# Patient Record
Sex: Female | Born: 1980
Health system: Southern US, Community
[De-identification: ages and names within clinical notes are randomized; demographics above are authoritative.]

## PROBLEM LIST (undated history)

## (undated) ENCOUNTER — Inpatient Hospital Stay (HOSPITAL_COMMUNITY): Payer: Self-pay

## (undated) DIAGNOSIS — R000 Tachycardia, unspecified: Secondary | ICD-10-CM

## (undated) DIAGNOSIS — G43019 Migraine without aura, intractable, without status migrainosus: Secondary | ICD-10-CM

## (undated) DIAGNOSIS — Z9109 Other allergy status, other than to drugs and biological substances: Secondary | ICD-10-CM

## (undated) DIAGNOSIS — I471 Supraventricular tachycardia, unspecified: Secondary | ICD-10-CM

## (undated) DIAGNOSIS — G501 Atypical facial pain: Secondary | ICD-10-CM

## (undated) DIAGNOSIS — H53149 Visual discomfort, unspecified: Secondary | ICD-10-CM

## (undated) DIAGNOSIS — G500 Trigeminal neuralgia: Secondary | ICD-10-CM

## (undated) DIAGNOSIS — J309 Allergic rhinitis, unspecified: Secondary | ICD-10-CM

## (undated) DIAGNOSIS — K219 Gastro-esophageal reflux disease without esophagitis: Secondary | ICD-10-CM

## (undated) DIAGNOSIS — R2990 Unspecified symptoms and signs involving the nervous system: Secondary | ICD-10-CM

## (undated) DIAGNOSIS — N939 Abnormal uterine and vaginal bleeding, unspecified: Secondary | ICD-10-CM

## (undated) DIAGNOSIS — T7840XA Allergy, unspecified, initial encounter: Secondary | ICD-10-CM

## (undated) DIAGNOSIS — K5909 Other constipation: Secondary | ICD-10-CM

## (undated) DIAGNOSIS — G4700 Insomnia, unspecified: Secondary | ICD-10-CM

## (undated) DIAGNOSIS — I959 Hypotension, unspecified: Secondary | ICD-10-CM

## (undated) DIAGNOSIS — I498 Other specified cardiac arrhythmias: Secondary | ICD-10-CM

## (undated) DIAGNOSIS — F410 Panic disorder [episodic paroxysmal anxiety] without agoraphobia: Secondary | ICD-10-CM

## (undated) DIAGNOSIS — H8190 Unspecified disorder of vestibular function, unspecified ear: Secondary | ICD-10-CM

## (undated) DIAGNOSIS — Z8742 Personal history of other diseases of the female genital tract: Secondary | ICD-10-CM

## (undated) DIAGNOSIS — F411 Generalized anxiety disorder: Secondary | ICD-10-CM

## (undated) DIAGNOSIS — I951 Orthostatic hypotension: Secondary | ICD-10-CM

## (undated) DIAGNOSIS — J45909 Unspecified asthma, uncomplicated: Secondary | ICD-10-CM

## (undated) DIAGNOSIS — Z973 Presence of spectacles and contact lenses: Secondary | ICD-10-CM

## (undated) DIAGNOSIS — G43E19 Chronic migraine with aura, intractable, without status migrainosus: Secondary | ICD-10-CM

## (undated) DIAGNOSIS — G90A Postural orthostatic tachycardia syndrome (POTS): Secondary | ICD-10-CM

## (undated) DIAGNOSIS — R002 Palpitations: Secondary | ICD-10-CM

## (undated) HISTORY — DX: Panic disorder (episodic paroxysmal anxiety): F41.0

## (undated) HISTORY — PX: BREAST EXCISIONAL BIOPSY: SUR124

## (undated) HISTORY — DX: Tachycardia, unspecified: R00.0

## (undated) HISTORY — PX: OTHER SURGICAL HISTORY: SHX169

## (undated) HISTORY — DX: Supraventricular tachycardia, unspecified: I47.10

## (undated) HISTORY — PX: FOOT SURGERY: SHX648

## (undated) HISTORY — DX: Atypical facial pain: G50.1

## (undated) HISTORY — PX: APPENDECTOMY: SHX54

## (undated) HISTORY — DX: Allergy, unspecified, initial encounter: T78.40XA

## (undated) HISTORY — DX: Hypotension, unspecified: I95.9

## (undated) HISTORY — PX: BREAST SURGERY: SHX581

## (undated) HISTORY — PX: KNEE ARTHROSCOPY: SUR90

## (undated) HISTORY — DX: Migraine without aura, intractable, without status migrainosus: G43.019

## (undated) HISTORY — DX: Supraventricular tachycardia: I47.1

## (undated) HISTORY — PX: KNEE SURGERY: SHX244

---
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
## 2001-08-26 HISTORY — PX: LAPAROSCOPIC APPENDECTOMY: SUR753

## 2008-05-01 ENCOUNTER — Ambulatory Visit: Payer: Self-pay | Admitting: Diagnostic Radiology

## 2008-05-01 ENCOUNTER — Emergency Department (HOSPITAL_BASED_OUTPATIENT_CLINIC_OR_DEPARTMENT_OTHER): Admission: EM | Admit: 2008-05-01 | Discharge: 2008-05-01 | Payer: Self-pay | Admitting: Emergency Medicine

## 2008-07-02 ENCOUNTER — Emergency Department (HOSPITAL_BASED_OUTPATIENT_CLINIC_OR_DEPARTMENT_OTHER): Admission: EM | Admit: 2008-07-02 | Discharge: 2008-07-03 | Payer: Self-pay | Admitting: Emergency Medicine

## 2010-06-06 LAB — CBC
Hemoglobin: 14.4 g/dL (ref 12.0–15.0)
Platelets: 173 10*3/uL (ref 150–400)
RDW: 12.7 % (ref 11.5–15.5)

## 2010-06-06 LAB — COMPREHENSIVE METABOLIC PANEL
ALT: <6 U/L (ref 0–35)
Albumin: 4.8 g/dL (ref 3.5–5.2)
Alkaline Phosphatase: 60 U/L (ref 39–117)
Glucose, Bld: 86 mg/dL (ref 70–99)
Potassium: 3.6 mEq/L (ref 3.5–5.1)
Sodium: 143 mEq/L (ref 135–145)
Total Protein: 8.3 g/dL (ref 6.0–8.3)

## 2010-06-06 LAB — DIFFERENTIAL
Basophils Relative: 1 % (ref 0–1)
Eosinophils Absolute: 0.1 10*3/uL (ref 0.0–0.7)
Eosinophils Relative: 2 % (ref 0–5)
Monocytes Absolute: 0.4 10*3/uL (ref 0.1–1.0)
Monocytes Relative: 5 % (ref 3–12)

## 2010-06-06 LAB — URINALYSIS, ROUTINE W REFLEX MICROSCOPIC
Glucose, UA: NEGATIVE mg/dL
Hgb urine dipstick: NEGATIVE
Ketones, ur: NEGATIVE mg/dL
Protein, ur: NEGATIVE mg/dL

## 2010-06-06 LAB — POCT TOXICOLOGY PANEL
Benzodiazepines: POSITIVE
Tetrahydrocannabinol: POSITIVE

## 2010-06-08 LAB — CBC
MCV: 91.5 fL (ref 78.0–100.0)
Platelets: 201 10*3/uL (ref 150–400)
RDW: 12.4 % (ref 11.5–15.5)
WBC: 6.5 10*3/uL (ref 4.0–10.5)

## 2010-06-08 LAB — BASIC METABOLIC PANEL
BUN: 14 mg/dL (ref 6–23)
Chloride: 107 mEq/L (ref 96–112)
Creatinine, Ser: 0.8 mg/dL (ref 0.4–1.2)
Glucose, Bld: 92 mg/dL (ref 70–99)

## 2010-06-08 LAB — D-DIMER, QUANTITATIVE: D-Dimer, Quant: 0.31 ug/mL-FEU (ref 0.00–0.48)

## 2010-06-08 LAB — DIFFERENTIAL
Basophils Absolute: 0 10*3/uL (ref 0.0–0.1)
Basophils Relative: 1 % (ref 0–1)
Eosinophils Absolute: 0.1 10*3/uL (ref 0.0–0.7)
Lymphs Abs: 1.8 10*3/uL (ref 0.7–4.0)
Neutrophils Relative %: 63 % (ref 43–77)

## 2011-02-27 HISTORY — PX: FOOT SURGERY: SHX648

## 2011-11-28 ENCOUNTER — Emergency Department (INDEPENDENT_AMBULATORY_CARE_PROVIDER_SITE_OTHER): Payer: Self-pay

## 2011-11-28 ENCOUNTER — Encounter (HOSPITAL_COMMUNITY): Payer: Self-pay

## 2011-11-28 ENCOUNTER — Emergency Department (INDEPENDENT_AMBULATORY_CARE_PROVIDER_SITE_OTHER)
Admission: EM | Admit: 2011-11-28 | Discharge: 2011-11-28 | Disposition: A | Discharge status: Home / Self Care | Payer: Self-pay | Source: Home / Self Care

## 2011-11-28 DIAGNOSIS — J40 Bronchitis, not specified as acute or chronic: Secondary | ICD-10-CM

## 2011-11-28 DIAGNOSIS — J45909 Unspecified asthma, uncomplicated: Secondary | ICD-10-CM

## 2011-11-28 HISTORY — DX: Hypotension, unspecified: I95.9

## 2011-11-28 MED ORDER — ALBUTEROL SULFATE (5 MG/ML) 0.5% IN NEBU: 2.5000 mg | INHALATION_SOLUTION | RESPIRATORY_TRACT | Status: DC

## 2011-11-28 MED ORDER — ALBUTEROL SULFATE (5 MG/ML) 0.5% IN NEBU
INHALATION_SOLUTION | RESPIRATORY_TRACT | Status: AC
  Filled 2011-11-28: qty 0.5

## 2011-11-28 MED ORDER — PREDNISONE 10 MG PO TABS: ORAL_TABLET | ORAL | Status: DC

## 2011-11-28 NOTE — ED Notes (Signed)
Patient complains of cough, states she was seen at [facility] on Saturday, provider suspected Pneumonia but was unable to diagnose without a chest xray

## 2011-11-28 NOTE — ED Provider Notes (Signed)
Medical screening examination/treatment/procedure(s) were performed by resident physician or non-physician practitioner and as supervising physician I was immediately available for consultation/collaboration.   KINDL,JAMES DOUGLAS MD.    James D Kindl, MD 11/28/11 2041 

## 2011-11-28 NOTE — ED Provider Notes (Signed)
History     CSN: [id number]  Arrival date & time 11/28/11  1622   None     Chief Complaint  Patient presents with  . Cough    (Consider location/radiation/quality/duration/timing/severity/associated sxs/prior treatment) Patient is a 31 y.o. female presenting with cough. The history is provided by the patient. No language interpreter was used.  Cough This is a new problem. The current episode started more than 1 week ago. The problem occurs constantly. The problem has been gradually worsening. The cough is non-productive. There has been no fever. Associated symptoms include shortness of breath. She has tried nothing for the symptoms. The treatment provided no relief. She is not a smoker.  Pt complains of a cough and congestion.  Pt reports she has a history of exercise induced asthma   Past Medical History  Diagnosis Date  . Low blood pressure     Past Surgical History  Procedure Date  . Breast surgery   . Appendectomy   . Knee surgery 2001  2002  . Foot surgery 2011 2012    No family history on file.  History  Substance Use Topics  . Smoking status: Current Some Day Smoker  . Smokeless tobacco: Not on file  . Alcohol Use: Yes    OB History    Grav Para Term Preterm Abortions TAB SAB Ect Mult Living                  Review of Systems  Respiratory: Positive for cough and shortness of breath.   All other systems reviewed and are negative.    Allergies  Codeine  Home Medications   Current Outpatient Rx  Name Route Sig Dispense Refill  . ALBUTEROL SULFATE IN Inhalation Inhale into the lungs. Two puffs every 4 hours    . AMOXICILLIN-POT CLAVULANATE 875-125 MG PO TABS Oral Take 1 tablet by mouth 2 (two) times daily.      BP 123/84  Pulse 110  Temp 98.3 F (36.8 C) (Oral)  Resp 18  SpO2 93%  LMP 11/16/2011  Physical Exam  Nursing note and vitals reviewed. Constitutional: She appears well-developed and well-nourished.  HENT:  Head: Normocephalic and  atraumatic.  Right Ear: External ear normal.  Left Ear: External ear normal.  Nose: Nose normal.  Mouth/Throat: Oropharynx is clear and moist.  Eyes: Conjunctivae normal and EOM are normal. Pupils are equal, round, and reactive to light.  Neck: Normal range of motion. Neck supple.  Cardiovascular: Normal rate and normal heart sounds.   Pulmonary/Chest: Effort normal. She has wheezes.  Abdominal: Soft.  Musculoskeletal: Normal range of motion.  Neurological: She is alert.  Skin: Skin is warm.    ED Course  Procedures (including critical care time)  Labs Reviewed - No data to display Dg Chest 2 View  11/28/2011  *RADIOLOGY REPORT*  Clinical Data: Cough  CHEST - 2 VIEW  Comparison: May 01, 2008  Findings: Lungs clear.  Heart size and pulmonary vascularity are normal.  No adenopathy.  No bone lesions.  IMPRESSION: Lungs clear.   Original Report Authenticated By: Arvin Collard. WOODRUFF III, M.D.      No diagnosis found.    MDM  Pt given albuterol neb here.  Rx for prednisone  Followup with your physician for recheck in 2-3 days       Lonia Skinner [place], [place] 11/28/11 1850

## 2012-02-27 HISTORY — PX: LAPAROSCOPIC ABDOMINAL EXPLORATION: SHX6249

## 2012-03-18 ENCOUNTER — Ambulatory Visit: Payer: BC Managed Care – PPO

## 2012-03-18 ENCOUNTER — Ambulatory Visit (INDEPENDENT_AMBULATORY_CARE_PROVIDER_SITE_OTHER): Payer: BC Managed Care – PPO | Admitting: Family Medicine

## 2012-03-18 VITALS — BP 102/80 | HR 82 | Temp 97.6°F | Resp 16 | Ht 69.0 in | Wt 165.0 lb

## 2012-03-18 DIAGNOSIS — M79641 Pain in right hand: Secondary | ICD-10-CM

## 2012-03-18 DIAGNOSIS — M79609 Pain in unspecified limb: Secondary | ICD-10-CM

## 2012-03-18 DIAGNOSIS — T148XXA Other injury of unspecified body region, initial encounter: Secondary | ICD-10-CM

## 2012-03-18 DIAGNOSIS — G4700 Insomnia, unspecified: Secondary | ICD-10-CM

## 2012-03-18 MED ORDER — IBUPROFEN 800 MG PO TABS: 800.0000 mg | ORAL_TABLET | Freq: Three times a day (TID) | ORAL | Status: DC | PRN

## 2012-03-18 NOTE — Progress Notes (Signed)
[facility]:  Office Visit  Chief Complaint:  Chief Complaint  Patient presents with  . pinky pain    right hand fell last saturday    HPI: Mia Rubio is a 32 y.o. female who complains of  Right hand pain s/p fall 5 days ago. 4-5/10 Sharp pain. Took ibuprofen, placed herself in wrist  And finger splint, had some relief. Constant sharp pain. No prior injuries or surgeiers. Worse pain with ROM and typing.   Scientist, research (life sciences) , Print production planner. Has problems going to sleep due to the type of job she does. 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 Only use it as needed. For example if she uses it when she is at a commercial shoot. Tried melatonin without relief. Was on Ambien 10 mg. Denies any abuse/dependence issues.   Past Medical History  Diagnosis Date  . Low blood pressure   . Tachycardia   . Hypotension    Past Surgical History  Procedure Date  . Breast surgery   . Appendectomy   . Knee surgery 2001  2002  . Foot surgery 2011 2012   History   Social History  . Marital Status: Single    Spouse Name: N/A    Number of Children: N/A  . Years of Education: N/A   Social History Main Topics  . Smoking status: Current Some Day Smoker  . Smokeless tobacco: None     Comment: only when drinks  . Alcohol Use: Yes  . Drug Use: No  . Sexually Active: None   Other Topics Concern  . None   Social History Narrative  . None   Family History  Problem Relation Age of Onset  . Cancer Maternal Grandmother     leg  . Diabetes Maternal Grandmother    Allergies  Allergen Reactions  . Codeine    Prior to Admission medications   Medication Sig Start Date End Date Taking? Authorizing Provider  glucosamine-chondroitin 500-400 MG tablet Take 1 tablet by mouth 2 (two)  times daily with a meal.   Yes Historical Provider, MD  ibuprofen (ADVIL,MOTRIN) 800 MG tablet Take 800 mg by mouth every 8 (eight) hours as needed.   Yes Historical Provider, MD  ALBUTEROL SULFATE IN Inhale into the lungs. Two puffs every 4 hours    Historical Provider, MD  amoxicillin-clavulanate (AUGMENTIN) 875-125 MG per tablet Take 1 tablet by mouth 2 (two) times daily.    Historical Provider, MD  predniSONE (DELTASONE) 10 MG tablet 6,5,4,3,2,1 taper 11/28/11   Elson Areas, PA  zolpidem (AMBIEN) 10 MG tablet Take 10 mg by mouth at bedtime as needed.    Historical Provider, MD     ROS: The patient denies fevers, chills, night sweats, unintentional weight loss, chest pain, palpitations, wheezing, dyspnea on exertion, nausea, vomiting, abdominal pain, dysuria, hematuria, melena, numbness, weakness, or tingling.   All other systems have been reviewed and were otherwise negative with the exception of those mentioned in the HPI and as above.    PHYSICAL EXAM: Filed Vitals:   03/18/12 1637  BP: 102/80  Pulse: 82  Temp: 97.6 F (36.4 C)  Resp: 16   Filed Vitals:   03/18/12 1637  Height: 5\' 9"  (1.753 m)  Weight: 165  lb (74.844 kg)   Body mass index is 24.37 kg/(m^2).  General: Alert, no acute distress HEENT:  Normocephalic, atraumatic, oropharynx patent.  Cardiovascular:  Regular rate and rhythm, no rubs murmurs or gallops.  No Carotid bruits, radial pulse intact. No pedal edema.  Respiratory: Clear to auscultation bilaterally.  No wheezes, rales, or rhonchi.  No cyanosis, no use of accessory musculature GI: No organomegaly, abdomen is soft and non-tender, positive bowel sounds.  No masses. Skin: No rashes. Neurologic: Facial musculature symmetric. Psychiatric: Patient is appropriate throughout our interaction. Lymphatic: No cervical lymphadenopathy Musculoskeletal: Gait intact. Right hand Right pinky pain at DIP, pain at base of MCP + minimal swelling Full ROM 5/5  strength Sensation intact   LABS: Results for orders placed during the hospital encounter of 07/02/08  CBC      Component Value Range   WBC 7.2  4.0 - 10.5 K/uL   RBC 4.49  3.87 - 5.11 MIL/uL   Hemoglobin 14.4  12.0 - 15.0 g/dL   HCT 72.5  36.6 - 44.0 %   MCV 93.1  78.0 - 100.0 fL   MCHC 34.5  30.0 - 36.0 g/dL   RDW 34.7  42.5 - 95.6 %   Platelets 173  150 - 400 K/uL  COMPREHENSIVE METABOLIC PANEL      Component Value Range   Sodium 143  135 - 145 mEq/L   Potassium 3.6 HEMOLYZED SPECIMEN, RESULTS MAY BE AFFECTED  3.5 - 5.1 mEq/L   Chloride 108  96 - 112 mEq/L   CO2 22  19 - 32 mEq/L   Glucose, Bld 86  70 - 99 mg/dL   BUN 16  6 - 23 mg/dL   Creatinine, Ser .7  0.4 - 1.2 mg/dL   Calcium 9.7  8.4 - 38.7 mg/dL   Total Protein 8.3  6.0 - 8.3 g/dL   Albumin 4.8  3.5 - 5.2 g/dL   AST 31  0 - 37 U/L   ALT <6  0 - 35 U/L   Alkaline Phosphatase 60  39 - 117 U/L   Total Bilirubin 0.6  0.3 - 1.2 mg/dL   GFR calc non Af Amer >60  >60 mL/min   GFR calc Af Amer    >60 mL/min   Value: >60            The eGFR has been calculated     using the MDRD equation.     This calculation has not been     validated in all clinical     situations.     eGFR's persistently     <60 mL/min signify     possible Chronic Kidney Disease.  DIFFERENTIAL      Component Value Range   Neutrophils Relative 63  43 - 77 %   Neutro Abs 4.6  1.7 - 7.7 K/uL   Lymphocytes Relative 30  12 - 46 %   Lymphs Abs 2.1  0.7 - 4.0 K/uL   Monocytes Relative 5  3 - 12 %   Monocytes Absolute 0.4  0.1 - 1.0 K/uL   Eosinophils Relative 2  0 - 5 %   Eosinophils Absolute 0.1  0.0 - 0.7 K/uL   Basophils Relative 1  0 - 1 %   Basophils Absolute 0.0  0.0 - 0.1 K/uL  PREGNANCY, URINE      Component Value Range   Preg Test, Ur       Value: NEGATIVE  THE SENSITIVITY OF THIS     METHODOLOGY IS >24 mIU/mL  URINALYSIS, ROUTINE W REFLEX MICROSCOPIC      Component Value Range   Color, Urine YELLOW  YELLOW   APPearance  CLEAR  CLEAR   Specific Gravity, Urine 1.028  1.005 - 1.030   pH 6.0  5.0 - 8.0   Glucose, UA NEGATIVE  NEGATIVE mg/dL   Hgb urine dipstick NEGATIVE  NEGATIVE   Bilirubin Urine NEGATIVE  NEGATIVE   Ketones, ur NEGATIVE  NEGATIVE mg/dL   Protein, ur NEGATIVE  NEGATIVE mg/dL   Urobilinogen, UA 1.0  0.0 - 1.0 mg/dL   Nitrite NEGATIVE  NEGATIVE   Leukocytes, UA    NEGATIVE   Value: NEGATIVE MICROSCOPIC NOT DONE ON URINES WITH NEGATIVE PROTEIN, BLOOD, LEUKOCYTES, NITRITE, OR GLUCOSE <1000 mg/dL.  POCT TOXICOLOGY PANEL      Component Value Range   Amphetamines NTD  NONE DETECTED   Barbiturates NTD  NONE DETECTED   Benzodiazepines Pos  NONE DETECTED   TCA Scrn NTD  NONE DETECTED   Opiates NTD  NONE DETECTED   Cocaine NTD  NONE DETECTED   Tetrahydrocannabinol Pos  NONE DETECTED   UDS Comment       Value:            DRUG SCREEN FOR MEDICAL PURPOSES     ONLY.  IF CONFIRMATION IS NEEDED     FOR ANY PURPOSE, NOTIFY LAB     WITHIN 5 DAYS.                LOWEST DETECTABLE LIMITS     FOR URINE DRUG SCREEN     Drug Class       Cutoff (ng/mL)     Amphetamine      1000     Barbiturate      200     Benzodiazepine   200     Tricyclics       300     Opiates          300     Cocaine          300     THC              50     EKG/XRAY:   Primary read interpreted by Dr. Conley Rolls at [facility]. NO fractures/dislocations   ASSESSMENT/PLAN: Encounter Diagnoses  Name Primary?  . Right hand pain Yes  . Insomnia   . Contusion    Rx Ambien 10 mg #30 Rx Ibuprofen 800 mg for pain F/u prn May use splints for comfort    Rush Salce PHUONG, DO 03/19/2012 12:08 PM

## 2013-04-10 ENCOUNTER — Other Ambulatory Visit: Payer: Self-pay | Admitting: Family

## 2013-04-10 DIAGNOSIS — R109 Unspecified abdominal pain: Secondary | ICD-10-CM

## 2013-04-17 ENCOUNTER — Ambulatory Visit
Admission: RE | Admit: 2013-04-17 | Discharge: 2013-04-17 | Disposition: A | Discharge status: Home / Self Care | Payer: 59 | Source: Ambulatory Visit | Attending: Family | Admitting: Family

## 2013-04-17 DIAGNOSIS — R109 Unspecified abdominal pain: Secondary | ICD-10-CM

## 2013-04-28 ENCOUNTER — Other Ambulatory Visit: Payer: Self-pay | Admitting: Family

## 2013-04-28 DIAGNOSIS — R10819 Abdominal tenderness, unspecified site: Secondary | ICD-10-CM

## 2013-04-29 ENCOUNTER — Ambulatory Visit
Admission: RE | Admit: 2013-04-29 | Discharge: 2013-04-29 | Disposition: A | Discharge status: Home / Self Care | Payer: 59 | Source: Ambulatory Visit | Attending: Family | Admitting: Family

## 2013-04-29 DIAGNOSIS — R10819 Abdominal tenderness, unspecified site: Secondary | ICD-10-CM

## 2013-05-01 ENCOUNTER — Other Ambulatory Visit: Payer: Self-pay | Admitting: Gastroenterology

## 2013-05-01 DIAGNOSIS — R109 Unspecified abdominal pain: Secondary | ICD-10-CM

## 2013-05-02 ENCOUNTER — Encounter (HOSPITAL_COMMUNITY): Payer: Self-pay | Admitting: Emergency Medicine

## 2013-05-02 DIAGNOSIS — R1084 Generalized abdominal pain: Secondary | ICD-10-CM | POA: Insufficient documentation

## 2013-05-02 DIAGNOSIS — Z79899 Other long term (current) drug therapy: Secondary | ICD-10-CM | POA: Insufficient documentation

## 2013-05-02 DIAGNOSIS — Z8679 Personal history of other diseases of the circulatory system: Secondary | ICD-10-CM | POA: Insufficient documentation

## 2013-05-02 DIAGNOSIS — R110 Nausea: Secondary | ICD-10-CM | POA: Insufficient documentation

## 2013-05-02 DIAGNOSIS — Z3202 Encounter for pregnancy test, result negative: Secondary | ICD-10-CM | POA: Insufficient documentation

## 2013-05-02 DIAGNOSIS — F17200 Nicotine dependence, unspecified, uncomplicated: Secondary | ICD-10-CM | POA: Insufficient documentation

## 2013-05-02 DIAGNOSIS — Z9089 Acquired absence of other organs: Secondary | ICD-10-CM | POA: Insufficient documentation

## 2013-05-02 LAB — COMPREHENSIVE METABOLIC PANEL
ALBUMIN: 4.8 g/dL (ref 3.5–5.2)
ALT: 23 U/L (ref 0–35)
AST: 34 U/L (ref 0–37)
Alkaline Phosphatase: 55 U/L (ref 39–117)
BUN: 10 mg/dL (ref 6–23)
CALCIUM: 10.4 mg/dL (ref 8.4–10.5)
CO2: 23 meq/L (ref 19–32)
CREATININE: 0.74 mg/dL (ref 0.50–1.10)
Chloride: 98 mEq/L (ref 96–112)
GFR calc Af Amer: >90 mL/min (ref >90)
Glucose, Bld: 93 mg/dL (ref 70–99)
Potassium: 3.3 mEq/L — ABNORMAL LOW (ref 3.7–5.3)
SODIUM: 138 meq/L (ref 137–147)
TOTAL PROTEIN: 8.8 g/dL — AB (ref 6.0–8.3)
Total Bilirubin: 0.3 mg/dL (ref 0.3–1.2)

## 2013-05-02 LAB — CBC WITH DIFFERENTIAL/PLATELET
BASOS PCT: 0 % (ref 0–1)
Basophils Absolute: 0 10*3/uL (ref 0.0–0.1)
EOS ABS: 0.1 10*3/uL (ref 0.0–0.7)
EOS PCT: 1 % (ref 0–5)
HEMATOCRIT: 46.1 % — AB (ref 36.0–46.0)
Hemoglobin: 16.5 g/dL — ABNORMAL HIGH (ref 12.0–15.0)
Lymphocytes Relative: 22 % (ref 12–46)
Lymphs Abs: 1.8 10*3/uL (ref 0.7–4.0)
MCH: 32 pg (ref 26.0–34.0)
MCHC: 35.8 g/dL (ref 30.0–36.0)
MCV: 89.3 fL (ref 78.0–100.0)
Monocytes Absolute: 0.3 10*3/uL (ref 0.1–1.0)
Monocytes Relative: 4 % (ref 3–12)
Neutro Abs: 5.7 10*3/uL (ref 1.7–7.7)
Neutrophils Relative %: 72 % (ref 43–77)
PLATELETS: 247 10*3/uL (ref 150–400)
RBC: 5.16 MIL/uL — ABNORMAL HIGH (ref 3.87–5.11)
RDW: 12.7 % (ref 11.5–15.5)
WBC: 7.9 10*3/uL (ref 4.0–10.5)

## 2013-05-02 LAB — POC URINE PREG, ED: PREG TEST UR: NEGATIVE

## 2013-05-02 LAB — URINALYSIS, ROUTINE W REFLEX MICROSCOPIC
Glucose, UA: NEGATIVE mg/dL
Hgb urine dipstick: NEGATIVE
Leukocytes, UA: NEGATIVE
NITRITE: NEGATIVE
PROTEIN: NEGATIVE mg/dL
Specific Gravity, Urine: 1.027 (ref 1.005–1.030)
Urobilinogen, UA: 0.2 mg/dL (ref 0.0–1.0)
pH: 5.5 (ref 5.0–8.0)

## 2013-05-02 LAB — LIPASE, BLOOD: Lipase: 25 U/L (ref 11–59)

## 2013-05-02 MED ORDER — ONDANSETRON 4 MG PO TBDP
8.0000 mg | ORAL_TABLET | Freq: Once | ORAL | Status: DC
  Filled 2013-05-02: qty 2

## 2013-05-02 NOTE — ED Notes (Signed)
Pt c/o abd pain on/off since feb.  Has been to see gi and is scd to have upper gi this Tuesday.  Feels like she is worse tonight feeling faint, nauseated.

## 2013-05-03 ENCOUNTER — Emergency Department (HOSPITAL_COMMUNITY)
Admission: EM | Admit: 2013-05-03 | Discharge: 2013-05-03 | Disposition: A | Discharge status: Home / Self Care | Payer: 59 | Attending: Emergency Medicine | Admitting: Emergency Medicine

## 2013-05-03 DIAGNOSIS — R110 Nausea: Secondary | ICD-10-CM

## 2013-05-03 DIAGNOSIS — R109 Unspecified abdominal pain: Secondary | ICD-10-CM

## 2013-05-03 MED ORDER — SODIUM CHLORIDE 0.9 % IV BOLUS (SEPSIS)
1000.0000 mL | Freq: Once | INTRAVENOUS | Status: AC
  Administered 2013-05-03: 1000 mL via INTRAVENOUS

## 2013-05-03 MED ORDER — POTASSIUM CHLORIDE 20 MEQ/15ML (10%) PO LIQD
20.0000 meq | Freq: Once | ORAL | Status: AC
  Administered 2013-05-03: 20 meq via ORAL
  Filled 2013-05-03: qty 15

## 2013-05-03 MED ORDER — DIPHENHYDRAMINE HCL 50 MG/ML IJ SOLN
12.5000 mg | Freq: Once | INTRAMUSCULAR | Status: AC
  Administered 2013-05-03: 12.5 mg via INTRAVENOUS
  Filled 2013-05-03: qty 1

## 2013-05-03 MED ORDER — ONDANSETRON HCL 4 MG/2ML IJ SOLN
4.0000 mg | Freq: Once | INTRAMUSCULAR | Status: AC
  Administered 2013-05-03: 4 mg via INTRAVENOUS
  Filled 2013-05-03: qty 2

## 2013-05-03 MED ORDER — ONDANSETRON HCL 4 MG PO TABS: 4.0000 mg | ORAL_TABLET | Freq: Four times a day (QID) | ORAL | Status: DC

## 2013-05-03 MED ORDER — MORPHINE SULFATE 4 MG/ML IJ SOLN
4.0000 mg | Freq: Once | INTRAMUSCULAR | Status: AC
  Administered 2013-05-03: 4 mg via INTRAVENOUS
  Filled 2013-05-03: qty 1

## 2013-05-03 MED ORDER — TRAMADOL HCL 50 MG PO TABS: 50.0000 mg | ORAL_TABLET | Freq: Four times a day (QID) | ORAL | Status: DC | PRN

## 2013-05-03 NOTE — Discharge Instructions (Signed)
Abdominal Pain, Adult °Many things can cause abdominal pain. Usually, abdominal pain is not caused by a disease and will improve without treatment. It can often be observed and treated at home. Your health care provider will do a physical exam and possibly order blood tests and X-rays to help determine the seriousness of your pain. However, in many cases, more time must pass before a clear cause of the pain can be found. Before that point, your health care provider may not know if you need more testing or further treatment. °HOME CARE INSTRUCTIONS  °Monitor your abdominal pain for any changes. The following actions may help to alleviate any discomfort you are experiencing: °· Only take over-the-counter or prescription medicines as directed by your health care provider. °· Do not take laxatives unless directed to do so by your health care provider. °· Try a clear liquid diet (broth, tea, or water) as directed by your health care provider. Slowly move to a bland diet as tolerated. °SEEK MEDICAL CARE IF: °· You have unexplained abdominal pain. °· You have abdominal pain associated with nausea or diarrhea. °· You have pain when you urinate or have a bowel movement. °· You experience abdominal pain that wakes you in the night. °· You have abdominal pain that is worsened or improved by eating food. °· You have abdominal pain that is worsened with eating fatty foods. °SEEK IMMEDIATE MEDICAL CARE IF:  °· Your pain does not go away within 2 hours. °· You have a fever. °· You keep throwing up (vomiting). °· Your pain is felt only in portions of the abdomen, such as the right side or the left lower portion of the abdomen. °· You pass bloody or black tarry stools. °MAKE SURE YOU: °· Understand these instructions.   °· Will watch your condition.   °· Will get help right away if you are not doing well or get worse.   °Document Released: 11/22/2004 Document Revised: 12/03/2012 Document Reviewed: 10/22/2012 °[employer]® Patient  Information ©2014 [employer]. ° °

## 2013-05-03 NOTE — ED Notes (Signed)
Patient presents with c/o abd pain that has been followed up with GI and is scheduled for a procedure on Tuesday.  States she is unable to take PO's.  Only able to take in small sips of liquids.  States the pain is a cramping type of pain.

## 2013-05-03 NOTE — ED Provider Notes (Signed)
CSN: [id number]     Arrival date & time 05/02/13  2141 History   First MD Initiated Contact with Patient 05/03/13 0048     Chief Complaint  Patient presents with  . Abdominal Pain     (Consider location/radiation/quality/duration/timing/severity/associated sxs/prior Treatment) HPI Please note that this is a late entry.  This patient is a 33 year old woman who presents to the emergency department with her family. She states that she has had diffuse abdominal pain for the past 6 months. Her pain seems to occur daily. Is worse after she eats. It has been more severe over the past week and previously. The patient reports diminished by mouth intake. By mouth intake seems to increases pain within about an hour of eating. So, the patient's po intake has been diminished.  She has had nausea. No vomiting. Pain 8/10  Patient denies diarrhea, history of abdominal surgeries, GU sx.   She has been evaluated by her PCP. The patient has been evaluated by Dr. Madilyn Fireman of GI.  She is scheduled for an EGD and/or colonoscopy with Dr. Madilyn Fireman in 2 days. She says she came to the ED because she felt she was becoming dehydrated.   Past Medical History  Diagnosis Date  . Low blood pressure   . Tachycardia   . Hypotension    Past Surgical History  Procedure Laterality Date  . Breast surgery    . Appendectomy    . Knee surgery  2001  2002  . Foot surgery  2011 2012   Family History  Problem Relation Age of Onset  . Cancer Maternal Grandmother     leg  . Diabetes Maternal Grandmother    History  Substance Use Topics  . Smoking status: Current Some Day Smoker  . Smokeless tobacco: Not on file     Comment: only when drinks  . Alcohol Use: Yes   OB History   Grav Para Term Preterm Abortions TAB SAB Ect Mult Living                 Review of Systems Ten point review of symptoms performed and is negative with the exception of symptoms noted above.     Allergies  Codeine and Phenergan  Home  Medications   Current Outpatient Rx  Name  Route  Sig  Dispense  Refill  . ALBUTEROL SULFATE IN   Inhalation   Inhale into the lungs. Two puffs every 4 hours         . glucosamine-chondroitin 500-400 MG tablet   Oral   Take 1 tablet by mouth 2 (two) times daily with a meal.         . ibuprofen (ADVIL,MOTRIN) 800 MG tablet   Oral   Take 800 mg by mouth every 8 (eight) hours as needed.         Marland Kitchen ibuprofen (ADVIL,MOTRIN) 800 MG tablet   Oral   Take 1 tablet (800 mg total) by mouth every 8 (eight) hours as needed for pain.   30 tablet   0   . zolpidem (AMBIEN) 10 MG tablet   Oral   Take 10 mg by mouth at bedtime as needed.          BP 103/68  Pulse 76  Temp(Src) 98.4 F (36.9 C) (Oral)  Resp 20  Ht 5\' 7"  (1.702 m)  Wt 164 lb 5 oz (74.532 kg)  BMI 25.73 kg/m2  SpO2 98%  LMP 04/23/2013 Physical Exam Gen: well developed and well nourished appearing Head:  NCAT Eyes: PERL, EOMI Nose: no epistaixis or rhinorrhea Mouth/throat: mucosa is moist and pink Neck: supple, no stridor Lungs: CTA B, no wheezing, rhonchi or rales CV: regular rate and rythm, good distal pulses.  Abd: soft, notender, nondistended Back: no ttp, no cva ttp Skin: warm and dry Ext: no edema, normal to inspection Neuro: CN ii-xii grossly intact, no focal deficits Psyche; normal affect,  calm and cooperative.  ED Course  Procedures (including critical care time) Labs Review  Results for orders placed during the hospital encounter of 05/03/13 (from the past 24 hour(s))  CBC WITH DIFFERENTIAL     Status: Abnormal   Collection Time    05/02/13 10:09 PM      Result Value Ref Range   WBC 7.9  4.0 - 10.5 K/uL   RBC 5.16 (*) 3.87 - 5.11 MIL/uL   Hemoglobin 16.5 (*) 12.0 - 15.0 g/dL   HCT 40.9 (*) 81.1 - 91.4 %   MCV 89.3  78.0 - 100.0 fL   MCH 32.0  26.0 - 34.0 pg   MCHC 35.8  30.0 - 36.0 g/dL   RDW 78.2  95.6 - 21.3 %   Platelets 247  150 - 400 K/uL   Neutrophils Relative % 72  43 - 77 %    Neutro Abs 5.7  1.7 - 7.7 K/uL   Lymphocytes Relative 22  12 - 46 %   Lymphs Abs 1.8  0.7 - 4.0 K/uL   Monocytes Relative 4  3 - 12 %   Monocytes Absolute 0.3  0.1 - 1.0 K/uL   Eosinophils Relative 1  0 - 5 %   Eosinophils Absolute 0.1  0.0 - 0.7 K/uL   Basophils Relative 0  0 - 1 %   Basophils Absolute 0.0  0.0 - 0.1 K/uL  COMPREHENSIVE METABOLIC PANEL     Status: Abnormal   Collection Time    05/02/13 10:09 PM      Result Value Ref Range   Sodium 138  137 - 147 mEq/L   Potassium 3.3 (*) 3.7 - 5.3 mEq/L   Chloride 98  96 - 112 mEq/L   CO2 23  19 - 32 mEq/L   Glucose, Bld 93  70 - 99 mg/dL   BUN 10  6 - 23 mg/dL   Creatinine, Ser 0.86  0.50 - 1.10 mg/dL   Calcium 57.8  8.4 - 46.9 mg/dL   Total Protein 8.8 (*) 6.0 - 8.3 g/dL   Albumin 4.8  3.5 - 5.2 g/dL   AST 34  0 - 37 U/L   ALT 23  0 - 35 U/L   Alkaline Phosphatase 55  39 - 117 U/L   Total Bilirubin 0.3  0.3 - 1.2 mg/dL   GFR calc non Af Amer >90  >90 mL/min   GFR calc Af Amer >90  >90 mL/min  LIPASE, BLOOD     Status: None   Collection Time    05/02/13 10:09 PM      Result Value Ref Range   Lipase 25  11 - 59 U/L  POC URINE PREG, ED     Status: None   Collection Time    05/02/13 10:09 PM      Result Value Ref Range   Preg Test, Ur NEGATIVE  NEGATIVE  URINALYSIS, ROUTINE W REFLEX MICROSCOPIC     Status: Abnormal   Collection Time    05/02/13 10:12 PM      Result Value Ref Range   Color,  Urine AMBER (*) YELLOW   APPearance CLOUDY (*) CLEAR   Specific Gravity, Urine 1.027  1.005 - 1.030   pH 5.5  5.0 - 8.0   Glucose, UA NEGATIVE  NEGATIVE mg/dL   Hgb urine dipstick NEGATIVE  NEGATIVE   Bilirubin Urine SMALL (*) NEGATIVE   Ketones, ur >80 (*) NEGATIVE mg/dL   Protein, ur NEGATIVE  NEGATIVE mg/dL   Urobilinogen, UA 0.2  0.0 - 1.0 mg/dL   Nitrite NEGATIVE  NEGATIVE   Leukocytes, UA NEGATIVE  NEGATIVE    MDM   Patient with improvement in symptoms. Able to tolerate po intake. Feeling better. Stable for discharge.  Requests refill of Tramadol which has helped to manage her pain.    Brandt LoosenJulie Manly, MD 05/03/13 [phone]

## 2013-05-05 ENCOUNTER — Ambulatory Visit
Admission: RE | Admit: 2013-05-05 | Discharge: 2013-05-05 | Disposition: A | Discharge status: Home / Self Care | Payer: 59 | Source: Ambulatory Visit | Attending: Gastroenterology | Admitting: Gastroenterology

## 2013-05-05 ENCOUNTER — Other Ambulatory Visit: Payer: Self-pay | Admitting: Gastroenterology

## 2013-05-05 DIAGNOSIS — R109 Unspecified abdominal pain: Secondary | ICD-10-CM

## 2013-05-06 ENCOUNTER — Other Ambulatory Visit: Payer: 59

## 2013-05-07 ENCOUNTER — Ambulatory Visit
Admission: RE | Admit: 2013-05-07 | Discharge: 2013-05-07 | Disposition: A | Discharge status: Home / Self Care | Payer: 59 | Source: Ambulatory Visit | Attending: Gastroenterology | Admitting: Gastroenterology

## 2013-05-07 DIAGNOSIS — R109 Unspecified abdominal pain: Secondary | ICD-10-CM

## 2013-05-12 ENCOUNTER — Ambulatory Visit
Admission: RE | Admit: 2013-05-12 | Discharge: 2013-05-12 | Disposition: A | Discharge status: Home / Self Care | Payer: 59 | Source: Ambulatory Visit | Attending: Gastroenterology | Admitting: Gastroenterology

## 2013-05-12 MED ORDER — IOHEXOL 300 MG/ML  SOLN
100.0000 mL | Freq: Once | INTRAMUSCULAR | Status: AC | PRN
  Administered 2013-05-12: 100 mL via INTRAVENOUS

## 2013-05-14 ENCOUNTER — Other Ambulatory Visit (HOSPITAL_COMMUNITY): Payer: Self-pay | Admitting: Gastroenterology

## 2013-05-14 DIAGNOSIS — R109 Unspecified abdominal pain: Secondary | ICD-10-CM

## 2013-05-19 ENCOUNTER — Ambulatory Visit (HOSPITAL_COMMUNITY)
Admission: RE | Admit: 2013-05-19 | Discharge: 2013-05-19 | Disposition: A | Discharge status: Home / Self Care | Payer: 59 | Source: Ambulatory Visit | Attending: Gastroenterology | Admitting: Gastroenterology

## 2013-05-19 DIAGNOSIS — R109 Unspecified abdominal pain: Secondary | ICD-10-CM

## 2013-05-19 MED ORDER — STERILE WATER FOR INJECTION IJ SOLN
INTRAMUSCULAR | Status: AC
  Filled 2013-05-19: qty 10

## 2013-05-19 MED ORDER — SINCALIDE 5 MCG IJ SOLR
0.0200 ug/kg | Freq: Once | INTRAMUSCULAR | Status: AC
  Administered 2013-05-19: 1.5 ug via INTRAVENOUS

## 2013-05-19 MED ORDER — SINCALIDE 5 MCG IJ SOLR
INTRAMUSCULAR | Status: AC
  Administered 2013-05-19: 1.5 ug via INTRAVENOUS
  Filled 2013-05-19: qty 5

## 2013-05-19 MED ORDER — TECHNETIUM TC 99M MEBROFENIN IV KIT
5.0000 | PACK | Freq: Once | INTRAVENOUS | Status: AC | PRN
  Administered 2013-05-19: 5 via INTRAVENOUS

## 2013-05-22 ENCOUNTER — Other Ambulatory Visit (HOSPITAL_COMMUNITY)
Admission: RE | Admit: 2013-05-22 | Discharge: 2013-05-22 | Disposition: A | Discharge status: Home / Self Care | Payer: 59 | Source: Ambulatory Visit | Attending: Obstetrics & Gynecology | Admitting: Obstetrics & Gynecology

## 2013-05-22 ENCOUNTER — Other Ambulatory Visit: Payer: Self-pay | Admitting: Obstetrics & Gynecology

## 2013-05-22 DIAGNOSIS — Z113 Encounter for screening for infections with a predominantly sexual mode of transmission: Secondary | ICD-10-CM | POA: Insufficient documentation

## 2013-05-22 DIAGNOSIS — Z01419 Encounter for gynecological examination (general) (routine) without abnormal findings: Secondary | ICD-10-CM | POA: Insufficient documentation

## 2013-05-22 DIAGNOSIS — Z1151 Encounter for screening for human papillomavirus (HPV): Secondary | ICD-10-CM | POA: Insufficient documentation

## 2013-05-25 ENCOUNTER — Other Ambulatory Visit: Payer: Self-pay | Admitting: Gastroenterology

## 2013-06-08 ENCOUNTER — Telehealth: Payer: Self-pay | Admitting: Hematology and Oncology

## 2013-06-08 NOTE — Telephone Encounter (Signed)
LEFT MESSAGE FOR PATIENT TO RETURN CALL TO SCHEDULE NEW PATIENT APPT.  °

## 2013-06-10 ENCOUNTER — Telehealth: Payer: Self-pay | Admitting: Hematology and Oncology

## 2013-06-10 NOTE — Telephone Encounter (Signed)
LEFT MESSAGE FOR PATIENT TO RETURN CALL TO SCHEDULE NEW PATIENT APPT.  °

## 2013-06-16 ENCOUNTER — Telehealth: Payer: Self-pay | Admitting: Hematology and Oncology

## 2013-06-16 NOTE — Telephone Encounter (Signed)
C/D 06/16/13 for appt. 06/26/13

## 2013-06-16 NOTE — Telephone Encounter (Signed)
S/W PATIENT AND GAVE NEW PATIENT APPT FOR 05/01 @ 2 W/DR. GORSUCH REFERRING DR. CAMMIE FULP DX- ABN BRUISING WELCOME PACKET MAILED.

## 2013-06-18 ENCOUNTER — Encounter (INDEPENDENT_AMBULATORY_CARE_PROVIDER_SITE_OTHER): Payer: Self-pay | Admitting: General Surgery

## 2013-06-18 ENCOUNTER — Ambulatory Visit (INDEPENDENT_AMBULATORY_CARE_PROVIDER_SITE_OTHER): Payer: 59 | Admitting: General Surgery

## 2013-06-18 ENCOUNTER — Encounter (INDEPENDENT_AMBULATORY_CARE_PROVIDER_SITE_OTHER): Payer: Self-pay

## 2013-06-18 VITALS — BP 106/80 | HR 90 | Temp 97.6°F | Resp 12 | Ht 69.0 in | Wt 156.0 lb

## 2013-06-18 DIAGNOSIS — G8929 Other chronic pain: Secondary | ICD-10-CM

## 2013-06-18 DIAGNOSIS — R1031 Right lower quadrant pain: Secondary | ICD-10-CM

## 2013-06-18 NOTE — Progress Notes (Signed)
Patient ID: Mia Rubio, female   DOB: 03/22/1980, 33 y.o.   MRN: [id number]  Chief Complaint  Patient presents with  . New Evaluation    eval RUQ abd pain    HPI Mia Rubio is a 33 y.o. female.  The patient is a 33 year old female who is referred by Dr. Jillyn HiddenFulp for evaluation of right lower quadrant pain. The patient states that the pain began in February of this year and has been at times incapacitating. Patient states the pain is not getting any better. Patient had a full GI workup to include colonoscopy, HIDA scan, CT scan, and ultrasound-which have all been negative at this point. Patient continues with right lower quadrant pain and has been out of work secondary to this. Patient is also visit with her GYN and has ruled out the possibility of endometriosis and they do not feel that her ovarian cyst is the issue.  The patient also has been symptomatic from unknown bruising to her left leg. Patient also states she has night sweats, fevers, and this had weight loss over the last 2 months of 16 pounds which is likely secondary to decreased eating likely secondary to her abdominal pain. Patient's is to see a hematologist next week.  HPI  Past Medical History  Diagnosis Date  . Low blood pressure   . Tachycardia   . Hypotension     Past Surgical History  Procedure Laterality Date  . Breast surgery    . Appendectomy    . Knee surgery  2001  2002  . Foot surgery  2011 2012    Family History  Problem Relation Age of Onset  . Cancer Maternal Grandmother     leg  . Diabetes Maternal Grandmother     Social History History  Substance Use Topics  . Smoking status: Current Some Day Smoker -- 0.50 packs/day  . Smokeless tobacco: Not on file     Comment: only when drinks  . Alcohol Use: Yes     Comment: socially    Allergies  Allergen Reactions  . Codeine   . Phenergan [Promethazine Hcl]     Current Outpatient Prescriptions  Medication Sig Dispense Refill  . ALBUTEROL  SULFATE IN Inhale into the lungs. Two puffs every 4 hours      . ALPRAZolam (XANAX) 0.5 MG tablet       . fluconazole (DIFLUCAN) 150 MG tablet       . glucosamine-chondroitin 500-400 MG tablet Take 1 tablet by mouth 2 (two) times daily with a meal.      . hyoscyamine (LEVBID) 0.375 MG 12 hr tablet       . ibuprofen (ADVIL,MOTRIN) 800 MG tablet Take 800 mg by mouth every 8 (eight) hours as needed.      Marland Kitchen. ibuprofen (ADVIL,MOTRIN) 800 MG tablet Take 1 tablet (800 mg total) by mouth every 8 (eight) hours as needed for pain.  30 tablet  0  . levocetirizine (XYZAL) 5 MG tablet       . metroNIDAZOLE (FLAGYL) 500 MG tablet       . montelukast (SINGULAIR) 10 MG tablet       . nystatin (MYCOSTATIN) 100000 UNIT/ML suspension       . omeprazole (PRILOSEC) 40 MG capsule       . polyethylene glycol-electrolytes (NULYTELY/GOLYTELY) 420 G solution       . traMADol (ULTRAM) 50 MG tablet Take 1 tablet (50 mg total) by mouth every 6 (six) hours as needed.  15  tablet  0  . zolpidem (AMBIEN) 10 MG tablet Take 10 mg by mouth at bedtime as needed.      . ondansetron (ZOFRAN) 4 MG tablet Take 1 tablet (4 mg total) by mouth every 6 (six) hours.  12 tablet  0   No current facility-administered medications for this visit.    Review of Systems Review of Systems  Constitutional: Positive for fever. Negative for unexpected weight change.  HENT: Negative.   Respiratory: Negative.   Cardiovascular: Negative.   Gastrointestinal: Positive for abdominal pain. Negative for diarrhea.  Neurological: Negative.   All other systems reviewed and are negative.   Blood pressure 106/80, pulse 90, temperature 97.6 F (36.4 C), temperature source Temporal, resp. rate 12, height 5\' 9"  (1.753 m), weight 156 lb (70.761 kg).  Physical Exam Physical Exam  Constitutional: She is oriented to person, place, and time. She appears well-developed and well-nourished.  HENT:  Head: Normocephalic and atraumatic.  Eyes: Conjunctivae and  EOM are normal. Pupils are equal, round, and reactive to light.  Neck: Normal range of motion. Neck supple.  Cardiovascular: Normal rate, regular rhythm and normal heart sounds.   Pulmonary/Chest: Effort normal and breath sounds normal.  Abdominal: Soft. Bowel sounds are normal. She exhibits no distension and no mass. There is no tenderness. There is no rebound and no guarding.  Musculoskeletal: Normal range of motion.  Neurological: She is alert and oriented to person, place, and time.  Skin: Skin is warm and dry.  Psychiatric: She has a normal mood and affect.    Data Reviewed As above  Assessment    33 year old female with right lower quadrant pain, could be related to scar tissue    Plan    1. Patient elected to proceed at this time 2 diagnostic laparoscopy and lysis of adhesions. I discussed this could possibly not resolve her abdominal pain. She understood this and feels that she would be comfortable knowing that there is not any organic cause her pain. 2. I discussed the risks and benefits of the procedure to includebut not limited to: Infection, bleeding, damage to structures, possible nonresolution of her abdominal pain, possible need for further surgery. Patient voiced understanding and wishes to proceed.       Mia Rubio 06/18/2013, 10:48 AM

## 2013-06-22 ENCOUNTER — Ambulatory Visit (INDEPENDENT_AMBULATORY_CARE_PROVIDER_SITE_OTHER): Payer: 59 | Admitting: Surgery

## 2013-06-26 ENCOUNTER — Telehealth: Payer: Self-pay | Admitting: Hematology and Oncology

## 2013-06-26 ENCOUNTER — Ambulatory Visit (HOSPITAL_BASED_OUTPATIENT_CLINIC_OR_DEPARTMENT_OTHER): Payer: 59 | Admitting: Hematology and Oncology

## 2013-06-26 ENCOUNTER — Ambulatory Visit: Payer: 59

## 2013-06-26 ENCOUNTER — Ambulatory Visit (HOSPITAL_BASED_OUTPATIENT_CLINIC_OR_DEPARTMENT_OTHER): Payer: 59

## 2013-06-26 ENCOUNTER — Encounter: Payer: Self-pay | Admitting: Hematology and Oncology

## 2013-06-26 VITALS — BP 123/83 | HR 102 | Temp 98.4°F | Resp 18 | Ht 69.0 in | Wt 151.4 lb

## 2013-06-26 DIAGNOSIS — R634 Abnormal weight loss: Secondary | ICD-10-CM

## 2013-06-26 DIAGNOSIS — R300 Dysuria: Secondary | ICD-10-CM

## 2013-06-26 DIAGNOSIS — R110 Nausea: Secondary | ICD-10-CM

## 2013-06-26 DIAGNOSIS — R109 Unspecified abdominal pain: Secondary | ICD-10-CM

## 2013-06-26 DIAGNOSIS — R61 Generalized hyperhidrosis: Secondary | ICD-10-CM

## 2013-06-26 DIAGNOSIS — T148XXA Other injury of unspecified body region, initial encounter: Secondary | ICD-10-CM

## 2013-06-26 DIAGNOSIS — R197 Diarrhea, unspecified: Secondary | ICD-10-CM

## 2013-06-26 DIAGNOSIS — R509 Fever, unspecified: Secondary | ICD-10-CM

## 2013-06-26 LAB — URINALYSIS, MICROSCOPIC - CHCC
Bilirubin (Urine): NEGATIVE
Blood: NEGATIVE
Glucose: NEGATIVE mg/dL
KETONES: 15 mg/dL
LEUKOCYTE ESTERASE: NEGATIVE
Nitrite: NEGATIVE
Protein: <30 mg/dL
RBC / HPF: NEGATIVE (ref 0–2)
Specific Gravity, Urine: 1.03 (ref 1.003–1.035)
Urobilinogen, UR: 0.2 mg/dL (ref 0.2–1)
pH: 6 (ref 4.6–8.0)

## 2013-06-26 LAB — COMPREHENSIVE METABOLIC PANEL (CC13)
AST: 15 U/L (ref 5–34)
Albumin: 4.5 g/dL (ref 3.5–5.0)
Alkaline Phosphatase: 46 U/L (ref 40–150)
Anion Gap: 11 mEq/L (ref 3–11)
BILIRUBIN TOTAL: 0.29 mg/dL (ref 0.20–1.20)
BUN: 17.6 mg/dL (ref 7.0–26.0)
CALCIUM: 10.6 mg/dL — AB (ref 8.4–10.4)
CHLORIDE: 107 meq/L (ref 98–109)
CO2: 21 mEq/L — ABNORMAL LOW (ref 22–29)
CREATININE: 0.8 mg/dL (ref 0.6–1.1)
Glucose: 116 mg/dl (ref 70–140)
Potassium: 3.5 mEq/L (ref 3.5–5.1)
SODIUM: 140 meq/L (ref 136–145)
TOTAL PROTEIN: 7.9 g/dL (ref 6.4–8.3)

## 2013-06-26 LAB — CBC WITH DIFFERENTIAL/PLATELET
BASO%: 0.5 % (ref 0.0–2.0)
Basophils Absolute: 0 10*3/uL (ref 0.0–0.1)
EOS%: 1.6 % (ref 0.0–7.0)
Eosinophils Absolute: 0.1 10*3/uL (ref 0.0–0.5)
HEMATOCRIT: 42.9 % (ref 34.8–46.6)
HGB: 14.5 g/dL (ref 11.6–15.9)
LYMPH%: 22.4 % (ref 14.0–49.7)
MCH: 30.2 pg (ref 25.1–34.0)
MCHC: 33.8 g/dL (ref 31.5–36.0)
MCV: 89.3 fL (ref 79.5–101.0)
MONO#: 0.5 10*3/uL (ref 0.1–0.9)
MONO%: 5.9 % (ref 0.0–14.0)
NEUT#: 6.1 10*3/uL (ref 1.5–6.5)
NEUT%: 69.6 % (ref 38.4–76.8)
Platelets: 193 10*3/uL (ref 145–400)
RBC: 4.8 10*6/uL (ref 3.70–5.45)
RDW: 13.1 % (ref 11.2–14.5)
WBC: 8.8 10*3/uL (ref 3.9–10.3)
lymph#: 2 10*3/uL (ref 0.9–3.3)

## 2013-06-26 LAB — PROTIME-INR
INR: 1 — ABNORMAL LOW (ref 2.00–3.50)
Protime: 12 Seconds (ref 10.6–13.4)

## 2013-06-26 LAB — MORPHOLOGY: PLT EST: ADEQUATE

## 2013-06-26 NOTE — Progress Notes (Signed)
Checked in new pt with no financial concerns. °

## 2013-06-26 NOTE — Progress Notes (Signed)
[facility] CONSULT [facility]  Patient Care Team: Cain Saupe, MD as PCP - General (Family Medicine) Cain Saupe, MD as Referring Physician (Family Medicine)  CHIEF COMPLAINTS/PURPOSE OF CONSULTATION:  Easy bruising  HISTORY OF PRESENTING ILLNESS:  Mia Rubio 33 y.o. female is here because of  complaints of easy bruising. Upon further discussion, the patient has more symptoms than just easy bruising. She is accompanied by her husband today. #1 easy bruising According to the patient, this starts around early March. She complained of predominant bruises in her lower extremities. The patient has history of chronic menorrhagia. The patient denies any recent signs or symptoms of bleeding such as spontaneous epistaxis, hematuria or hematochezia. #2 severe, persistent right upper and lower quadrant abdominal pain. The patient felt she may have contracted viral gastroenteritis since around February 2015. She had history of nausea, vomiting and diarrhea. She had extensive evaluation including EGD, colonoscopy and CT enterography which rule out inflammatory bowel disease. She has postprandial pain after eating. Any activities may aggravate her pain even more. She has been started on pain medicine recently because of this. She does have constant nausea. The patient has started on a liquid diet only for the past few days. She complained of severe diarrhea after eating. She denies any bloody bowel movement. She had significant anorexia. She noticed changes in the color of his stool which looked pale and dark  #3 daily fever, night sweats and lethargy The patient lies in bed all the time. She is at risk of losing her job as she has not worked over the past few months. The fevers are low-grade in nature, ranging between 99-100. She had daily sweating which is drenchin in nature.  MEDICAL HISTORY:  Past Medical History  Diagnosis Date  . Low blood pressure   . Tachycardia   .  Hypotension     SURGICAL HISTORY: Past Surgical History  Procedure Laterality Date  . Breast surgery    . Appendectomy    . Knee surgery  2001  2002  . Foot surgery  2011 2012  . Plantar fasciitis      SOCIAL HISTORY: History   Social History  . Marital Status: Married    Spouse Name: N/A    Number of Children: N/A  . Years of Education: N/A   Occupational History  . Not on file.   Social History Main Topics  . Smoking status: Current Some Day Smoker -- 0.50 packs/day for 15 years  . Smokeless tobacco: Never Used     Comment: only when drinks  . Alcohol Use: Yes     Comment: socially  . Drug Use: No  . Sexual Activity: Not on file   Other Topics Concern  . Not on file   Social History Narrative  . No narrative on file    FAMILY HISTORY: Family History  Problem Relation Age of Onset  . Cancer Maternal Grandmother     leg  . Diabetes Maternal Grandmother     ALLERGIES:  is allergic to codeine and phenergan.  MEDICATIONS:  Current Outpatient Prescriptions  Medication Sig Dispense Refill  . ALBUTEROL SULFATE IN Inhale into the lungs as needed. Two puffs every 4 hours      . ALPRAZolam (XANAX) 0.5 MG tablet Take 0.5 mg by mouth at bedtime as needed.       Marland Kitchen ibuprofen (ADVIL,MOTRIN) 800 MG tablet Take 800 mg by mouth every 8 (eight) hours as needed.      Marland Kitchen  nystatin (MYCOSTATIN) 100000 UNIT/ML suspension       . omeprazole (PRILOSEC) 40 MG capsule       . ondansetron (ZOFRAN) 4 MG tablet Take 1 tablet (4 mg total) by mouth every 6 (six) hours.  12 tablet  0  . traMADol (ULTRAM) 50 MG tablet Take 1 tablet (50 mg total) by mouth every 6 (six) hours as needed.  15 tablet  0  . tranexamic acid (LYSTEDA) 650 MG TABS tablet Take 1,300 mg by mouth 3 (three) times daily. During time of menstrual cycle      . zolpidem (AMBIEN) 10 MG tablet Take 10 mg by mouth at bedtime as needed.       No current facility-administered medications for this visit.    REVIEW OF  SYSTEMS:   Eyes: Denies blurriness of vision, double vision or watery eyes Ears, nose, mouth, throat, and face: Denies mucositis or sore throat Respiratory: Denies cough, dyspnea or wheezes Cardiovascular: Denies palpitation, chest discomfort or lower extremity swelling Skin: Denies abnormal skin rashes Lymphatics: Denies new lymphadenopathy  Neurological:Denies numbness, tingling  Behavioral/Psych: Mood is stable, no new changes  All other systems were reviewed with the patient and are negative.  PHYSICAL EXAMINATION: ECOG PERFORMANCE STATUS: 3 - Symptomatic, >50% confined to bed  Filed Vitals:   06/26/13 1437  BP: 123/83  Pulse: 102  Temp: 98.4 F (36.9 C)  Resp: 18   Filed Weights   06/26/13 1437  Weight: 151 lb 6.4 oz (68.675 kg)    GENERAL:alert, in mild distress from pain. SKIN: skin color, texture, turgor are normal, no rashes or significant lesions EYES: normal, conjunctiva are pink and non-injected, sclera clear OROPHARYNX:no exudate, no erythema and lips, buccal mucosa, and tongue normal  NECK: supple, thyroid normal size, non-tender, without nodularity LYMPH:  no palpable lymphadenopathy in the cervical, axillary or inguinal LUNGS: clear to auscultation and percussion with normal breathing effort HEART: regular rate & rhythm and no murmurs and no lower extremity edema ABDOMEN:abdomen soft, with diffuse tenderness to gentle palpation and guarding. No hepatosplenomegaly. Musculoskeletal:no cyanosis of digits and no clubbing  PSYCH: alert & oriented x 3 with fluent speech NEURO: no focal motor/sensory deficits  LABORATORY DATA:  I have reviewed the data as listed Lab Results  Component Value Date   WBC 8.8 06/26/2013   HGB 14.5 06/26/2013   HCT 42.9 06/26/2013   MCV 89.3 06/26/2013   PLT 193 06/26/2013    Recent Labs  05/02/13 2209 06/26/13 1551  NA 138 140  K 3.3* 3.5  CL 98  --   CO2 23 21*  GLUCOSE 93 116  BUN 10 17.6  CREATININE 0.74 0.8  CALCIUM 10.4  10.6*  GFRNONAA >90  --   GFRAA >90  --   PROT 8.8* 7.9  ALBUMIN 4.8 4.5  AST 34 15  ALT 23 <6  ALKPHOS 55 46  BILITOT 0.3 0.29    RADIOGRAPHIC STUDIES: Recent CT scan showed no evidence of abnormalities. ASSESSMENT & PLAN:  #1 easy bruising #2 severe abdominal pain with nausea, diarrhea and anorexia #3 abnormal weight loss #4 daily night sweats I would order additional workup for work up for bruising. The patient had extensive workup which ruled out any physical GI causes. Although rare, I will order a urine screening test to rule out porphyria I will order some thyroid function tests given her profound weight loss and fatigue. I will see her back next week for further review of test results. Orders Placed This  Encounter  Procedures  . Urine culture    Standing Status: Future     Number of Occurrences: 1     Standing Expiration Date: 06/26/2014  . Comprehensive metabolic panel    Standing Status: Future     Number of Occurrences: 1     Standing Expiration Date: 06/26/2014  . CBC with Differential    Standing Status: Future     Number of Occurrences: 1     Standing Expiration Date: 06/26/2014  . Protime-INR    Standing Status: Future     Number of Occurrences: 1     Standing Expiration Date: 06/26/2014  . APTT    Standing Status: Future     Number of Occurrences: 1     Standing Expiration Date: 06/26/2014  . Morphology    Standing Status: Future     Number of Occurrences: 1     Standing Expiration Date: 06/26/2014  . ANA    Standing Status: Future     Number of Occurrences: 1     Standing Expiration Date: 06/26/2014  . Protein electrophoresis, serum    Standing Status: Future     Number of Occurrences: 1     Standing Expiration Date: 06/26/2014  . Sedimentation rate    Standing Status: Future     Number of Occurrences: 1     Standing Expiration Date: 06/26/2014  . C-reactive protein    Standing Status: Future     Number of Occurrences: 1     Standing Expiration  Date: 06/26/2014  . Urinalysis, Microscopic - [facility]    Standing Status: Future     Number of Occurrences: 1     Standing Expiration Date: 06/26/2014  . TSH    Standing Status: Future     Number of Occurrences: 1     Standing Expiration Date: 06/26/2014  . T4, free    Standing Status: Future     Number of Occurrences: 1     Standing Expiration Date: 06/26/2014  . Pregnancy, urine    Standing Status: Future     Number of Occurrences: 1     Standing Expiration Date: 06/26/2014  . Porphyrins, urine, fractionated    Standing Status: Future     Number of Occurrences: 1     Standing Expiration Date: 06/26/2014    All questions were answered. The patient knows to call the clinic with any problems, questions or concerns. I spent 40 minutes counseling the patient face to face. The total time spent in the appointment was 60 minutes and more than 50% was on counseling.     Artis DelayNi Jonel Weldon, MD 06/26/2013 8:14 PM

## 2013-06-26 NOTE — Telephone Encounter (Signed)
gve the pt her may 2015 appt calendar along with the avs. Sent pt back to the lab.

## 2013-06-29 ENCOUNTER — Ambulatory Visit: Payer: 59

## 2013-06-29 LAB — TSH CHCC: TSH: 1.067 m(IU)/L (ref 0.308–3.960)

## 2013-06-29 LAB — URINE CULTURE

## 2013-06-29 LAB — PREGNANCY, URINE: PREG TEST UR: NEGATIVE

## 2013-06-30 LAB — PROTEIN ELECTROPHORESIS, SERUM
Albumin ELP: 60.3 % (ref 55.8–66.1)
Alpha-1-Globulin: 4.8 % (ref 2.9–4.9)
Alpha-2-Globulin: 11.2 % (ref 7.1–11.8)
BETA GLOBULIN: 5.6 % (ref 4.7–7.2)
Beta 2: 5.6 % (ref 3.2–6.5)
Gamma Globulin: 12.5 % (ref 11.1–18.8)
Total Protein, Serum Electrophoresis: 7.4 g/dL (ref 6.0–8.3)

## 2013-06-30 LAB — T4, FREE: Free T4: 1.01 ng/dL (ref 0.80–1.80)

## 2013-06-30 LAB — APTT: aPTT: 35 seconds (ref 24–37)

## 2013-06-30 LAB — ANA: Anti Nuclear Antibody(ANA): NEGATIVE

## 2013-06-30 LAB — C-REACTIVE PROTEIN: CRP: <0.5 mg/dL (ref <0.60)

## 2013-06-30 LAB — SEDIMENTATION RATE: Sed Rate: 16 mm/hr (ref 0–22)

## 2013-07-01 ENCOUNTER — Ambulatory Visit (HOSPITAL_BASED_OUTPATIENT_CLINIC_OR_DEPARTMENT_OTHER): Payer: 59 | Admitting: Hematology and Oncology

## 2013-07-01 VITALS — BP 127/83 | HR 110 | Temp 98.3°F | Resp 20 | Ht 69.0 in | Wt 151.0 lb

## 2013-07-01 DIAGNOSIS — R634 Abnormal weight loss: Secondary | ICD-10-CM

## 2013-07-01 DIAGNOSIS — R61 Generalized hyperhidrosis: Secondary | ICD-10-CM

## 2013-07-01 DIAGNOSIS — T148XXA Other injury of unspecified body region, initial encounter: Secondary | ICD-10-CM

## 2013-07-01 DIAGNOSIS — R109 Unspecified abdominal pain: Secondary | ICD-10-CM

## 2013-07-01 DIAGNOSIS — R197 Diarrhea, unspecified: Secondary | ICD-10-CM

## 2013-07-01 DIAGNOSIS — R110 Nausea: Secondary | ICD-10-CM

## 2013-07-01 NOTE — Progress Notes (Signed)
[facility] OFFICE PROGRESS NOTE  FULP, CAMMIE, MD DIAGNOSIS:  Easy bruising, abnormal weight loss, severe abdominal pain  SUMMARY OF HEMATOLOGIC HISTORY: This patient was referred to me for easy bruising and multiple other complaints. Blood work cannot identify the cause of her easy bruising. Coagulation study and platelet count were within normal limits. INTERVAL HISTORY: Mia Rubio 33 y.o. female returns for further followup. She has no new concerns since her last visit.  I have reviewed the past medical history, past surgical history, social history and family history with the patient and they are unchanged from previous note.  ALLERGIES:  is allergic to codeine and phenergan.  MEDICATIONS:  Current Outpatient Prescriptions  Medication Sig Dispense Refill  . ALBUTEROL SULFATE IN Inhale into the lungs as needed. Two puffs every 4 hours      . ALPRAZolam (XANAX) 0.5 MG tablet Take 0.5 mg by mouth at bedtime as needed.       Marland Kitchen. ibuprofen (ADVIL,MOTRIN) 800 MG tablet Take 800 mg by mouth every 8 (eight) hours as needed.      . nystatin (MYCOSTATIN) 100000 UNIT/ML suspension       . omeprazole (PRILOSEC) 40 MG capsule       . ondansetron (ZOFRAN) 4 MG tablet Take 1 tablet (4 mg total) by mouth every 6 (six) hours.  12 tablet  0  . traMADol (ULTRAM) 50 MG tablet Take 1 tablet (50 mg total) by mouth every 6 (six) hours as needed.  15 tablet  0  . tranexamic acid (LYSTEDA) 650 MG TABS tablet Take 1,300 mg by mouth 3 (three) times daily. During time of menstrual cycle      . zolpidem (AMBIEN) 10 MG tablet Take 10 mg by mouth at bedtime as needed.       No current facility-administered medications for this visit.     REVIEW OF SYSTEMS:   All other systems were reviewed with the patient and are negative.  PHYSICAL EXAMINATION: ECOG PERFORMANCE STATUS: 1 - Symptomatic but completely ambulatory  Filed Vitals:   07/01/13 1427  BP: 127/83  Pulse: 110  Temp: 98.3 F  (36.8 C)  Resp: 20   Filed Weights   07/01/13 1427  Weight: 151 lb (68.493 kg)    GENERAL:alert, no distress and comfortable Musculoskeletal:no cyanosis of digits and no clubbing  NEURO: alert & oriented x 3 with fluent speech, no focal motor/sensory deficits  LABORATORY DATA:  I have reviewed the data as listed No results found for this or any previous visit (from the past 48 hour(s)).  Lab Results  Component Value Date   WBC 8.8 06/26/2013   HGB 14.5 06/26/2013   HCT 42.9 06/26/2013   MCV 89.3 06/26/2013   PLT 193 06/26/2013   ASSESSMENT & PLAN:  #1 easy bruising Cause is unknown. Her platelet count and coagulation studies were within normal limits. #2 severe abdominal pain with nausea, diarrhea and anorexia #3 abnormal weight loss #4 daily night sweats Cause is unknown. She has planned surgery for laparoscopic evaluation. Just to think outside the box, I have ordered urine collection for porphyria. I think the likelihood that becomes back positive this very low. I have not make a return appointment for the patient to come back. I recommend she proceed with laparoscopic surgical evaluation to identify the cause of her problems. All questions were answered. The patient knows to call the clinic with any problems, questions or concerns. No barriers to learning was detected.  I spent  15 minutes counseling the patient face to face. The total time spent in the appointment was 20 minutes and more than 50% was on counseling.     Artis DelayNi Asah Lamay, MD 07/01/2013 4:59 PM

## 2013-07-07 ENCOUNTER — Telehealth (INDEPENDENT_AMBULATORY_CARE_PROVIDER_SITE_OTHER): Payer: Self-pay

## 2013-07-07 ENCOUNTER — Other Ambulatory Visit (INDEPENDENT_AMBULATORY_CARE_PROVIDER_SITE_OTHER): Payer: Self-pay

## 2013-07-07 DIAGNOSIS — R1031 Right lower quadrant pain: Secondary | ICD-10-CM

## 2013-07-07 DIAGNOSIS — K660 Peritoneal adhesions (postprocedural) (postinfection): Secondary | ICD-10-CM

## 2013-07-07 MED ORDER — OXYCODONE-ACETAMINOPHEN 5-325 MG PO TABS
1.0000 | ORAL_TABLET | ORAL | Status: DC | PRN
Start: 2013-07-07 — End: 2013-07-15

## 2013-07-07 NOTE — Telephone Encounter (Signed)
Called patient to answer any questions she may have prior to her surgery.  Patient states she was having constipation but, has spoke to someone and this has improved.  Patient has been taking pain medication which she has been advised to not take the day of her surgery. Patient has questions about how much she need's to pay today for her surgery.  Patient [facility].  Patient verbalized understanding.

## 2013-07-08 ENCOUNTER — Telehealth (INDEPENDENT_AMBULATORY_CARE_PROVIDER_SITE_OTHER): Payer: Self-pay

## 2013-07-08 ENCOUNTER — Ambulatory Visit: Payer: 59 | Admitting: Hematology and Oncology

## 2013-07-08 NOTE — Telephone Encounter (Signed)
Pt states that she went outside earlier to make some phone calls and when she went back inside she started having some SOB. She states that she has some allergies to pollen she was assuming this was it but she has not gotten any better after being inside for over a hour. Advised the patient that if this continues or gets worse she may need to go to the ER. Made her appt for tomorrow morning. Pt verbalized understanding and agrees with POC.

## 2013-07-09 ENCOUNTER — Encounter (INDEPENDENT_AMBULATORY_CARE_PROVIDER_SITE_OTHER): Payer: 59 | Admitting: General Surgery

## 2013-07-10 ENCOUNTER — Telehealth: Payer: Self-pay | Admitting: *Deleted

## 2013-07-10 LAB — PORPHYRINS, FRACTIONATED URINE (TIMED COLLECTION)
Coproporphyrin I: 6.8 mcg/24 h — ABNORMAL LOW (ref 7.1–48.7)
Coproporphyrin III: 41.5 mcg/24 h (ref 11.0–148.5)
HEPTACARBOXYPORPHYRIN 24 HOUR URINE: 1 ug/(24.h) (ref <=3.3)
Hexacarboxyporphyrin: 1.7 mcg/24 h (ref <=10)
Porphyrins total: 58.8 mcg/24 h (ref 35.0–210.7)
UROPORPHRYINS I 24 HOUR URINE: 6.7 ug/(24.h) (ref 4.1–22.4)
UROPORPHYRIN III (PORPF): 1.1 ug/(24.h) (ref 0.7–7.4)
Volume, Urine-PORPF: 200 mL

## 2013-07-10 NOTE — Telephone Encounter (Signed)
Message copied by Rolanda JayWINDHAM, Chayil Gantt C on Fri Jul 10, 2013 10:01 AM ------      [facility]: [facility] [place]      Created: Fri Jul 10, 2013  9:04 AM      Regarding: porphyria results       Please let her know the results were normal      ----- Message -----         From: [facility] Interface         Sent: 06/26/2013   4:10 PM           To: Artis DelayNi Gorsuch, MD                   ------

## 2013-07-10 NOTE — Telephone Encounter (Signed)
Left VM for pt requesting she call nurse back regarding her test results.

## 2013-07-15 ENCOUNTER — Encounter (INDEPENDENT_AMBULATORY_CARE_PROVIDER_SITE_OTHER): Payer: Self-pay | Admitting: General Surgery

## 2013-07-15 ENCOUNTER — Ambulatory Visit (INDEPENDENT_AMBULATORY_CARE_PROVIDER_SITE_OTHER): Payer: 59 | Admitting: General Surgery

## 2013-07-15 VITALS — BP 98/70 | HR 38 | Temp 97.8°F | Ht 69.0 in | Wt 149.0 lb

## 2013-07-15 DIAGNOSIS — Z9889 Other specified postprocedural states: Secondary | ICD-10-CM

## 2013-07-15 NOTE — Progress Notes (Signed)
Patient ID: Mia Rubio, female   DOB: 06/22/1980, 33 y.o.   MRN: [id number] Post op course The patient is a 33 year old female status post diagnostic laparoscopy lysis of adhesions for chronic abdominal pain. Patient states she's fill well postoperatively and no longer has abdominal pain.  On Exam: Wounds are clean dry and intact  Assessment and Plan 33 year old female status post diagnostic laparoscopy and Lysis of adhesions 1. Patient to continue with routine activity.  2. Patient follow up as needed   Axel FillerArmando Carine Nordgren, MD [facility] General & Minimally Invasive Surgery Trauma & Emergency Surgery

## 2013-07-21 ENCOUNTER — Other Ambulatory Visit: Payer: Self-pay | Admitting: Family Medicine

## 2013-07-21 ENCOUNTER — Telehealth (INDEPENDENT_AMBULATORY_CARE_PROVIDER_SITE_OTHER): Payer: Self-pay | Admitting: General Surgery

## 2013-07-21 ENCOUNTER — Ambulatory Visit
Admission: RE | Admit: 2013-07-21 | Discharge: 2013-07-21 | Disposition: A | Discharge status: Home / Self Care | Payer: 59 | Source: Ambulatory Visit | Attending: Family Medicine | Admitting: Family Medicine

## 2013-07-21 DIAGNOSIS — J189 Pneumonia, unspecified organism: Secondary | ICD-10-CM

## 2013-07-21 NOTE — Telephone Encounter (Signed)
Pt called to report new, sharp pain in Rt side, described as "like a cramp in your side when you are running."  She has been resting and taking ibuprofen for it.  Reassured her and determined it is not gas pain in her bowels.  Advised her to apply ice pack and continue the ibuprofen, and can take 600 mg Q6H or 800 mg Q8H.  She will try this and call back if pain worsens.

## 2013-07-28 ENCOUNTER — Telehealth (INDEPENDENT_AMBULATORY_CARE_PROVIDER_SITE_OTHER): Payer: Self-pay

## 2013-07-28 NOTE — Telephone Encounter (Signed)
Patient states she is having to take Vicodin for the pain in her abdomen to go completely away. She has tried ice but it does not help neither does the Ibuprofen . Her PCP advised her to call her surgeon for instructions . Please advise

## 2013-07-29 ENCOUNTER — Telehealth (INDEPENDENT_AMBULATORY_CARE_PROVIDER_SITE_OTHER): Payer: Self-pay

## 2013-07-29 NOTE — Telephone Encounter (Signed)
Patient states she is still having abdomen cramping/pain , most of the time pain is at work. She states she is to start full time next week and feels she will not be able to do so. She is taking tramadol while at work  without any relief she sits at a computer all day  . She takes vicodin at home and it does help. Advised  For her to reposition while sitting and to ambulate much as possible. Patient  States she need to be seen by Dr. Derrell Lolling per her PCP because he did her surgery. Please advise

## 2013-07-29 NOTE — Telephone Encounter (Signed)
She can come in to be seen.  Is it the same pain she was having before the surgery or does it seem different?

## 2013-07-29 NOTE — Telephone Encounter (Signed)
After speaking to Dr Derrell Lolling called patient to clarify pain location/ intensity . Patient states the pain is in the same location but the pain is not as bad as before surgery. Informed her I could make appointment with  DR. Derrell Lolling but he would not proscribe her a narcotic she would need to continue  the Ibuprofen per DR. Derrell Lolling . Patient states she saw her PCP earlier today and he presciribed her Vicodin so she will continue to see PCP.  Advise her to call if she had further questions/concerns Patient verbalized understanding

## 2013-12-21 LAB — OB RESULTS CONSOLE RPR: RPR: NONREACTIVE

## 2013-12-21 LAB — OB RESULTS CONSOLE RUBELLA ANTIBODY, IGM: Rubella: NON-IMMUNE/NOT IMMUNE

## 2013-12-21 LAB — OB RESULTS CONSOLE ABO/RH: RH Type: POSITIVE

## 2013-12-21 LAB — OB RESULTS CONSOLE HIV ANTIBODY (ROUTINE TESTING): HIV: NONREACTIVE

## 2013-12-21 LAB — OB RESULTS CONSOLE HEPATITIS B SURFACE ANTIGEN: Hepatitis B Surface Ag: NEGATIVE

## 2013-12-21 LAB — OB RESULTS CONSOLE ANTIBODY SCREEN: ANTIBODY SCREEN: NEGATIVE

## 2014-01-13 LAB — OB RESULTS CONSOLE GC/CHLAMYDIA
CHLAMYDIA, DNA PROBE: NEGATIVE
GC PROBE AMP, GENITAL: NEGATIVE

## 2014-07-15 ENCOUNTER — Inpatient Hospital Stay (HOSPITAL_COMMUNITY)
Admission: AD | Admit: 2014-07-15 | Discharge: 2014-07-16 | Disposition: A | Discharge status: Home / Self Care | Payer: Medicaid Other | Source: Ambulatory Visit | Attending: Obstetrics & Gynecology | Admitting: Obstetrics & Gynecology

## 2014-07-15 ENCOUNTER — Encounter (HOSPITAL_COMMUNITY): Payer: Self-pay | Admitting: *Deleted

## 2014-07-15 DIAGNOSIS — O99333 Smoking (tobacco) complicating pregnancy, third trimester: Secondary | ICD-10-CM | POA: Insufficient documentation

## 2014-07-15 DIAGNOSIS — R030 Elevated blood-pressure reading, without diagnosis of hypertension: Secondary | ICD-10-CM | POA: Diagnosis present

## 2014-07-15 DIAGNOSIS — O9989 Other specified diseases and conditions complicating pregnancy, childbirth and the puerperium: Secondary | ICD-10-CM | POA: Diagnosis not present

## 2014-07-15 DIAGNOSIS — Z3A36 36 weeks gestation of pregnancy: Secondary | ICD-10-CM | POA: Insufficient documentation

## 2014-07-15 DIAGNOSIS — R51 Headache: Secondary | ICD-10-CM | POA: Insufficient documentation

## 2014-07-15 DIAGNOSIS — R519 Headache, unspecified: Secondary | ICD-10-CM

## 2014-07-15 DIAGNOSIS — O26893 Other specified pregnancy related conditions, third trimester: Secondary | ICD-10-CM

## 2014-07-15 MED ORDER — ACETAMINOPHEN 500 MG PO TABS
1000.0000 mg | ORAL_TABLET | Freq: Once | ORAL | Status: AC
  Administered 2014-07-16: 1000 mg via ORAL
  Filled 2014-07-15: qty 2

## 2014-07-15 NOTE — MAU Note (Signed)
PT SAYS   SHE WENT  TO DR  TODAY  - BP 128/78   THEN TONIGHT  147/92   AND    HAS H/A - STARTED AT 10PM-   NO MEDS.    NO VE TODAY.   SOME MILD UC'S.       DENIES HSV AND MRSA.

## 2014-07-16 ENCOUNTER — Encounter (HOSPITAL_COMMUNITY): Payer: Self-pay | Admitting: *Deleted

## 2014-07-16 DIAGNOSIS — R51 Headache: Secondary | ICD-10-CM

## 2014-07-16 DIAGNOSIS — O26893 Other specified pregnancy related conditions, third trimester: Secondary | ICD-10-CM

## 2014-07-16 DIAGNOSIS — O9989 Other specified diseases and conditions complicating pregnancy, childbirth and the puerperium: Secondary | ICD-10-CM | POA: Diagnosis not present

## 2014-07-16 LAB — URINALYSIS, ROUTINE W REFLEX MICROSCOPIC
Bilirubin Urine: NEGATIVE
Glucose, UA: NEGATIVE mg/dL
Hgb urine dipstick: NEGATIVE
Ketones, ur: NEGATIVE mg/dL
NITRITE: NEGATIVE
Protein, ur: NEGATIVE mg/dL
UROBILINOGEN UA: 0.2 mg/dL (ref 0.0–1.0)
pH: 5.5 (ref 5.0–8.0)

## 2014-07-16 LAB — COMPREHENSIVE METABOLIC PANEL
ALBUMIN: 2.9 g/dL — AB (ref 3.5–5.0)
ALT: 12 U/L — ABNORMAL LOW (ref 14–54)
ANION GAP: 8 (ref 5–15)
AST: 22 U/L (ref 15–41)
Alkaline Phosphatase: 130 U/L — ABNORMAL HIGH (ref 38–126)
BUN: 7 mg/dL (ref 6–20)
CHLORIDE: 107 mmol/L (ref 101–111)
CO2: 21 mmol/L — ABNORMAL LOW (ref 22–32)
CREATININE: 0.51 mg/dL (ref 0.44–1.00)
Calcium: 8.6 mg/dL — ABNORMAL LOW (ref 8.9–10.3)
GFR calc non Af Amer: >60 mL/min (ref >60)
GLUCOSE: 84 mg/dL (ref 65–99)
Potassium: 3.4 mmol/L — ABNORMAL LOW (ref 3.5–5.1)
Sodium: 136 mmol/L (ref 135–145)
Total Bilirubin: 0.3 mg/dL (ref 0.3–1.2)
Total Protein: 5.6 g/dL — ABNORMAL LOW (ref 6.5–8.1)

## 2014-07-16 LAB — PROTEIN / CREATININE RATIO, URINE
Creatinine, Urine: 38 mg/dL
Total Protein, Urine: <6 mg/dL

## 2014-07-16 LAB — CBC
HCT: 37.7 % (ref 36.0–46.0)
Hemoglobin: 13.1 g/dL (ref 12.0–15.0)
MCH: 32 pg (ref 26.0–34.0)
MCHC: 34.7 g/dL (ref 30.0–36.0)
MCV: 92 fL (ref 78.0–100.0)
Platelets: 147 10*3/uL — ABNORMAL LOW (ref 150–400)
RBC: 4.1 MIL/uL (ref 3.87–5.11)
RDW: 14.1 % (ref 11.5–15.5)
WBC: 9.5 10*3/uL (ref 4.0–10.5)

## 2014-07-16 LAB — URINE MICROSCOPIC-ADD ON

## 2014-07-16 NOTE — Discharge Instructions (Signed)

## 2014-07-16 NOTE — MAU Provider Note (Signed)
History     CSN: [id number]  Arrival date and time: 07/15/14 2321   First Provider Initiated Contact with Patient 07/16/14 0017      No chief complaint on file.  HPI Comments: Mia Rubio is a 34 y.o. G2P0 at 5243w0d who presents today with elevated blood pressure. She states that she was seen in the office today, and was told that her B/P was "normal, but a little high". She was told to check her BP at home if she had a headache. She states that she had a headache tonight, and her B/P at home was 140/97. She states that she has also had swelling and visual changes. She has not taken anything for her headache prior to arrival. She denies any vaginal bleeding or LOF. She states that the fetus has been moving normally. She denies any contractions. She denies any RUQ pain.   Hypertension This is a new problem. The current episode started today. Condition status: pregnant  Associated symptoms include headaches. Pertinent negatives include no shortness of breath. There are no associated agents to hypertension. There are no compliance problems.    Past Medical History  Diagnosis Date  . Low blood pressure   . Tachycardia   . Hypotension     Past Surgical History  Procedure Laterality Date  . Breast surgery    . Appendectomy    . Knee surgery  2001  2002  . Foot surgery  2011 2012  . Plantar fasciitis      Family History  Problem Relation Age of Onset  . Cancer Maternal Grandmother     leg  . Diabetes Maternal Grandmother     History  Substance Use Topics  . Smoking status: Current Some Day Smoker -- 0.50 packs/day for 15 years  . Smokeless tobacco: Never Used     Comment: only when drinks  . Alcohol Use: Yes     Comment: socially    Allergies:  Allergies  Allergen Reactions  . Codeine   . Phenergan [Promethazine Hcl]     Prescriptions prior to admission  Medication Sig Dispense Refill Last Dose  . ALBUTEROL SULFATE IN Inhale into the lungs as needed. Two puffs  every 4 hours   Taking  . ALPRAZolam (XANAX) 0.5 MG tablet Take 0.5 mg by mouth at bedtime as needed.    Taking  . ibuprofen (ADVIL,MOTRIN) 800 MG tablet Take 800 mg by mouth every 8 (eight) hours as needed.   Taking  . levofloxacin (LEVAQUIN) 500 MG tablet    Taking  . nystatin (MYCOSTATIN) 100000 UNIT/ML suspension    Taking  . omeprazole (PRILOSEC) 40 MG capsule    Taking  . ondansetron (ZOFRAN) 4 MG tablet Take 1 tablet (4 mg total) by mouth every 6 (six) hours. 12 tablet 0 Taking  . tranexamic acid (LYSTEDA) 650 MG TABS tablet Take 1,300 mg by mouth 3 (three) times daily. During time of menstrual cycle   Taking  . zolpidem (AMBIEN) 10 MG tablet Take 10 mg by mouth at bedtime as needed.   Taking    Review of Systems  Eyes:       Seeing flashes of light x 1 week  Respiratory: Negative for shortness of breath.   Gastrointestinal: Positive for diarrhea. Negative for nausea, vomiting, abdominal pain and constipation.  Neurological: Positive for headaches.   Physical Exam   Blood pressure 114/81, pulse 97, temperature 97.8 F (36.6 C), temperature source Oral, resp. rate 18, height 5\' 8"  (1.727 m),  weight 89.359 kg (197 lb).  Physical Exam  Nursing note and vitals reviewed. Constitutional: She is oriented to person, place, and time. She appears well-developed and well-nourished. No distress.  Cardiovascular: Normal rate.   Respiratory: Effort normal.  GI: Soft. There is no tenderness.  Musculoskeletal: She exhibits edema (from the feet to just below the knee ).  Neurological: She is alert and oriented to person, place, and time. She has normal reflexes.  No clonus   Skin: Skin is warm and dry.    Results for orders placed or performed during the hospital encounter of 07/15/14 (from the past 24 hour(s))  Urinalysis, Routine w reflex microscopic     Status: Abnormal   Collection Time: 07/15/14 11:27 PM  Result Value Ref Range   Color, Urine YELLOW YELLOW   APPearance CLEAR CLEAR    Specific Gravity, Urine <1.005 (L) 1.005 - 1.030   pH 5.5 5.0 - 8.0   Glucose, UA NEGATIVE NEGATIVE mg/dL   Hgb urine dipstick NEGATIVE NEGATIVE   Bilirubin Urine NEGATIVE NEGATIVE   Ketones, ur NEGATIVE NEGATIVE mg/dL   Protein, ur NEGATIVE NEGATIVE mg/dL   Urobilinogen, UA 0.2 0.0 - 1.0 mg/dL   Nitrite NEGATIVE NEGATIVE   Leukocytes, UA SMALL (A) NEGATIVE  Protein / creatinine ratio, urine     Status: None   Collection Time: 07/15/14 11:27 PM  Result Value Ref Range   Creatinine, Urine 38.00 mg/dL   Total Protein, Urine <6 mg/dL   Protein Creatinine Ratio        0.00 - 0.15 mg/mg[Cre]  Urine microscopic-add on     Status: Abnormal   Collection Time: 07/15/14 11:27 PM  Result Value Ref Range   Squamous Epithelial / LPF FEW (A) RARE   WBC, UA 3-6 <3 WBC/hpf   Bacteria, UA FEW (A) RARE  CBC     Status: Abnormal   Collection Time: 07/16/14 12:03 AM  Result Value Ref Range   WBC 9.5 4.0 - 10.5 K/uL   RBC 4.10 3.87 - 5.11 MIL/uL   Hemoglobin 13.1 12.0 - 15.0 g/dL   HCT 16.137.7 09.636.0 - 04.546.0 %   MCV 92.0 78.0 - 100.0 fL   MCH 32.0 26.0 - 34.0 pg   MCHC 34.7 30.0 - 36.0 g/dL   RDW 40.914.1 81.111.5 - 91.415.5 %   Platelets 147 (L) 150 - 400 K/uL  Comprehensive metabolic panel     Status: Abnormal   Collection Time: 07/16/14 12:03 AM  Result Value Ref Range   Sodium 136 135 - 145 mmol/L   Potassium 3.4 (L) 3.5 - 5.1 mmol/L   Chloride 107 101 - 111 mmol/L   CO2 21 (L) 22 - 32 mmol/L   Glucose, Bld 84 65 - 99 mg/dL   BUN 7 6 - 20 mg/dL   Creatinine, Ser 7.820.51 0.44 - 1.00 mg/dL   Calcium 8.6 (L) 8.9 - 10.3 mg/dL   Total Protein 5.6 (L) 6.5 - 8.1 g/dL   Albumin 2.9 (L) 3.5 - 5.0 g/dL   AST 22 15 - 41 U/L   ALT 12 (L) 14 - 54 U/L   Alkaline Phosphatase 130 (H) 38 - 126 U/L   Total Bilirubin 0.3 0.3 - 1.2 mg/dL   GFR calc non Af Amer >60 >60 mL/min   GFR calc Af Amer >60 >60 mL/min   Anion gap 8 5 - 15    MAU Course  Procedures  MDM UPC below reportable range  0059: D/W Dr. Charlotta Newtonzan, and  reviewed  labs. Ok for DC home. FU in the office in one week.   Assessment and Plan   1. Headache in pregnancy, antepartum, third trimester    DC home Pre-eclampsia warning signs reviewed Fetal kick counts Return to MAU as needed FU with the office as scheduled for one week  Follow-up Information    Follow up with Myna Hidalgo, M, DO.   Specialty:  Obstetrics and Gynecology   Why:  As scheduled   Contact information:   [address] [place] [address] [phone]        Tawnya Crook 07/16/2014, 12:23 AM

## 2014-08-08 ENCOUNTER — Inpatient Hospital Stay (HOSPITAL_COMMUNITY)
Admission: AD | Admit: 2014-08-08 | Discharge: 2014-08-08 | Disposition: A | Discharge status: Home / Self Care | Payer: Medicaid Other | Source: Ambulatory Visit | Attending: Obstetrics and Gynecology | Admitting: Obstetrics and Gynecology

## 2014-08-08 DIAGNOSIS — O9A213 Injury, poisoning and certain other consequences of external causes complicating pregnancy, third trimester: Secondary | ICD-10-CM

## 2014-08-08 DIAGNOSIS — O9989 Other specified diseases and conditions complicating pregnancy, childbirth and the puerperium: Secondary | ICD-10-CM | POA: Diagnosis not present

## 2014-08-08 DIAGNOSIS — Z3A39 39 weeks gestation of pregnancy: Secondary | ICD-10-CM | POA: Diagnosis not present

## 2014-08-08 DIAGNOSIS — O99333 Smoking (tobacco) complicating pregnancy, third trimester: Secondary | ICD-10-CM | POA: Insufficient documentation

## 2014-08-08 DIAGNOSIS — T1490 Injury, unspecified: Secondary | ICD-10-CM

## 2014-08-08 DIAGNOSIS — R109 Unspecified abdominal pain: Secondary | ICD-10-CM | POA: Diagnosis present

## 2014-08-08 NOTE — MAU Note (Addendum)
Woke up after elbow to belly.  Noted some contractions and belly tightening. Decreased fetal movement.  Did kick count and baby moved
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
## 2014-08-08 NOTE — Discharge Instructions (Signed)
What Do I Need to Know About Injuries During Pregnancy? °Injuries can happen during pregnancy. Minor falls and accidents usually do not harm you or your baby. However, any injury should be reported to your doctor. °WHAT CAN I DO TO PROTECT MYSELF FROM INJURIES? °· Remove rugs and loose objects on the floor. °· Wear comfortable shoes that have a good grip. Do not wear high-heeled shoes. °· Always wear your seat belt. The lap belt should be below your belly. Always practice safe driving. °· Do not ride on a motorcycle. °· Do not participate in high-impact activities or sports. °· Avoid: °· Walking on wet or slippery floors. °· Fires. °· Starting fires. °· Lifting heavy pots of boiling or hot liquids. °· Fixing electrical problems. °· Only take medicine as told by your doctor. °· Know your blood type and the blood type of the baby's father. °· Call your local emergency services (911 in the [place] if you are a victim of domestic violence or assault. For help and support, contact the National Domestic Violence Hotline. °WHEN SHOULD I GET HELP RIGHT AWAY? °· You fall on your belly or have any high-impact accident or injury. °· You have been a victim of domestic violence or any kind of violence. °· You have been in a car accident. °· You have bleeding from your vagina. °· Fluid is leaking from your vagina. °· You start to have belly cramping (contractions) or pain. °· You feel weak or pass out (faint). °· You start to throw up (vomit) after an injury. °· You have been burned. °· You have a stiff neck or neck pain. °· You get a headache or have vision problems after an injury. °· You do not feel the baby move or the baby is not moving as much as normal. °Document Released: 03/17/2010 Document Revised: 06/29/2013 Document Reviewed: 11/19/2012 °[employer]® Patient Information ©2015 [employer]. This information is not intended to replace advice given to you by your health care provider. Make sure you discuss any questions you  have with your health care provider. ° °Placental Abruption °Your placenta is the organ that nourishes your unborn baby (fetus). Your baby gets his or her blood supply and nutrients through your placenta. It is your baby's life support system. It is attached to the inside of your uterus until after your baby is born.  °Placental abruption is when the placenta partly or completely separates from the uterus before your baby is born. This is rare, but it can happen any time after 20 weeks of pregnancy. A small separation may not cause problems, but a large separation may be dangerous for you and your baby. °CAUSES  °Most of the time the cause of a placental abruption is unknown. Though it is rare, a placental abruption can be caused by:  °· An abdominal injury.   °· The baby turning from a buttocks-first position (breech presentation) or a sideways position (transverse) to a headfirst position (cephalic).   °· Delivering the first of multiple babies (twins, triplets, or more).   °· Sudden loss of amniotic fluid (premature rupture of the membranes).   °· An abnormally short umbilical cord. °RISK FACTORS °Some risk factors make a placental abruption more likely, including: °· History of placental abruption. °· High blood pressure (hypertension). °· Smoking. °· Alcohol intake. °· Blood clotting problems. °· Too much amniotic fluid. °· Having had multiples (twins or triplets or more). °· Seizures and convulsions. °· Diabetes mellitus. °· Having had more than four children. °· Age 35 years   years or older.  Illegal drug use.  Injury to your abdomen. SIGNS AND SYMPTOMS  A small placental abruption may not cause symptoms. If you do have symptoms, they may include:  Mild abdominal pain.  Slight vaginal bleeding. Symptoms of severe placental abruption depend on the size of the separation and the stage of pregnancy. Symptoms may include:   Sudden pain in your uterus.  Abdominal pain.  Vaginal bleeding.  Tender  uterus.  Severe abdominal pain with tenderness.  Continual contractions of your uterus.  Back pain.  Weakness, light-headedness. DIAGNOSIS  Placental abruption is suspected when a pregnant woman develops sudden pain in her uterus. The health care provider will check whether the uterus is very tender, hard, and enlarging and whether the baby has an abnormal heart rate or rhythm. Ultrasonography (commonly called an ultrasound) will be done. Blood work will also be done to make sure that there are enough healthy red blood cells and that there are no clotting problems or signs of too much blood loss. TREATMENT  Placental abruption is usually an emergency. It requires treatment right away. Your treatment will depend on:   The amount of bleeding.  Whether you or you baby are in distress.  The stage of your pregnancy.  The maturity of the baby. Treatment for partial separation of the placenta is bed rest and close observation. You also may need a blood transfusion or to receive fluids through an IV tube. Treatment for complete placental separation is delivery of your baby. You may have a cesarean delivery if your baby is in distress. HOME CARE INSTRUCTIONS   Only take medicines as directed by your health care provider.  Arrange for help at home before and after you deliver the baby, especially if you had a cesarean delivery or lost a lot of blood.  Get plenty of rest and sleep.  Do not have sexual intercourse until your health care provider says it is okay.  Do not use tampons or douche unless your health care provider says it is okay. SEEK MEDICAL CARE IF:  You have light vaginal bleeding or spotting.  You have any type of trauma, such as a fall or jolt during an accident.  You are having trouble avoiding drugs, alcohol, or smoking. SEEK IMMEDIATE MEDICAL CARE IF:  You have vaginal bleeding.  You have abdominal pain.  You have continuous uterine contractions.  You have a  hard, tender uterus.  You do not feel the baby move, or the baby moves very little. MAKE SURE YOU:  Understand these instructions.  Will watch your condition.  Will get help right away if you are not doing well or get worse. Document Released: 02/12/2005 Document Revised: 02/17/2013 Document Reviewed: 12/05/2012 [facility] Patient Information 2015 [place], [place]. This information is not intended to replace advice given to you by your health care provider. Make sure you discuss any questions you have with your health care provider.

## 2014-08-10 LAB — OB RESULTS CONSOLE GBS: STREP GROUP B AG: NEGATIVE

## 2014-08-13 ENCOUNTER — Encounter (HOSPITAL_COMMUNITY): Payer: Self-pay | Admitting: *Deleted

## 2014-08-13 ENCOUNTER — Telehealth (HOSPITAL_COMMUNITY): Payer: Self-pay | Admitting: *Deleted

## 2014-08-13 NOTE — Telephone Encounter (Signed)
Preadmission screen  

## 2014-08-18 NOTE — H&P (Addendum)
HPI: 34 y/o G1P0 @ [redacted]w[redacted]d estimated gestational age (as dated by LMP c/w 20 week ultrasound) presents for IOL for postdates.   no Leaking of Fluid,   no Vaginal Bleeding,   irregular Uterine Contractions,  + Fetal Movement.  ROS: no HA, no epigastric pain, no visual changes.    Pregnancy complicated by: 1) Tobacco use during pregnancy, transitioned to [place], using at most 1-2x per day    Prenatal Transfer Tool  Maternal Diabetes: No Genetic Screening: Normal Maternal Ultrasounds/Referrals: Normal Fetal Ultrasounds or other Referrals:  None Maternal Substance Abuse:  Yes:  Type: Smoker Significant Maternal Medications:  None Significant Maternal Lab Results: None   PNL:  GBS negative,O pos, antibody neg, Hep B neg, Rub immune, RPR NR, HIV neg, Hgb 14.2, plt 230, GC/C neg (01/13/14), Pap neg (05/2013)  Immunizations: Tdap-04/08/14  OBHx: primip PMHx:  none Meds:  PNV Allergy:   Allergies  Allergen Reactions  . Codeine   . Phenergan [Promethazine Hcl]    SurgHx: none SocHx:   + Tobacco (see above), no  EtOH, no Illicit Drugs  O: Examination performed on 08/17/14 in office, To be obtained on arrival to L&D Gen. AAOx3, NAD CV.  RRR  No murmur.  Resp. CTAB, no wheeze or crackles. Abd. Gravid Extr.  1+ non-pitting edema B/L , no calf tenderness  FHT: 130 by doppler (in office 08/17/14) SVE: closed- difficulty reaching cervix due to posterior location, soft, 25/-3, vertex by exam   Labs: see orders  A/P:  34 y.o. G1P0 @ [redacted]w[redacted]d EGA who presents for IOL due to postdates -FWB:  Reassuring by doppler, pt to be on continuous upon arrival to L&D  -IOL: Induction to start with cytotec -GBS: negative -Pain Management: Stadol prn, ok for epidural upon patient request -CBC, T&S to be collected  Myna Hidalgo, DO [phone] (pager) [phone] (office)

## 2014-08-19 ENCOUNTER — Encounter (HOSPITAL_COMMUNITY): Payer: Self-pay

## 2014-08-19 ENCOUNTER — Encounter (HOSPITAL_COMMUNITY)
Admission: RE | Disposition: A | Discharge status: Home / Self Care | Payer: Self-pay | Source: Ambulatory Visit | Attending: Obstetrics & Gynecology

## 2014-08-19 ENCOUNTER — Inpatient Hospital Stay (HOSPITAL_COMMUNITY): Payer: Medicaid Other | Admitting: Anesthesiology

## 2014-08-19 ENCOUNTER — Inpatient Hospital Stay (HOSPITAL_COMMUNITY)
Admission: RE | Admit: 2014-08-19 | Discharge: 2014-08-21 | DRG: 766 | Disposition: A | Discharge status: Home / Self Care | Payer: Medicaid Other | Source: Ambulatory Visit | Attending: Obstetrics & Gynecology | Admitting: Obstetrics & Gynecology

## 2014-08-19 DIAGNOSIS — F17210 Nicotine dependence, cigarettes, uncomplicated: Secondary | ICD-10-CM | POA: Diagnosis present

## 2014-08-19 DIAGNOSIS — O480 Post-term pregnancy: Secondary | ICD-10-CM | POA: Diagnosis present

## 2014-08-19 DIAGNOSIS — O99334 Smoking (tobacco) complicating childbirth: Secondary | ICD-10-CM | POA: Diagnosis present

## 2014-08-19 DIAGNOSIS — O9081 Anemia of the puerperium: Secondary | ICD-10-CM | POA: Diagnosis not present

## 2014-08-19 DIAGNOSIS — D649 Anemia, unspecified: Secondary | ICD-10-CM | POA: Diagnosis not present

## 2014-08-19 DIAGNOSIS — Z3490 Encounter for supervision of normal pregnancy, unspecified, unspecified trimester: Secondary | ICD-10-CM

## 2014-08-19 DIAGNOSIS — Z3A40 40 weeks gestation of pregnancy: Secondary | ICD-10-CM | POA: Diagnosis present

## 2014-08-19 LAB — CBC
HCT: 40.4 % (ref 36.0–46.0)
HEMOGLOBIN: 14.7 g/dL (ref 12.0–15.0)
MCH: 33.1 pg (ref 26.0–34.0)
MCHC: 36.4 g/dL — ABNORMAL HIGH (ref 30.0–36.0)
MCV: 91 fL (ref 78.0–100.0)
Platelets: 143 10*3/uL — ABNORMAL LOW (ref 150–400)
RBC: 4.44 MIL/uL (ref 3.87–5.11)
RDW: 14.1 % (ref 11.5–15.5)
WBC: 11.8 10*3/uL — AB (ref 4.0–10.5)

## 2014-08-19 LAB — ABO/RH: ABO/RH(D): O POS

## 2014-08-19 LAB — TYPE AND SCREEN
ABO/RH(D): O POS
Antibody Screen: NEGATIVE

## 2014-08-19 SURGERY — Surgical Case
Anesthesia: Epidural | Site: Abdomen

## 2014-08-19 MED ORDER — FENTANYL 2.5 MCG/ML BUPIVACAINE 1/10 % EPIDURAL INFUSION (WH - ANES)
14.0000 mL/h | INTRAMUSCULAR | Status: DC | PRN
  Administered 2014-08-19 (×2): 14 mL/h via EPIDURAL
  Filled 2014-08-19 (×2): qty 125

## 2014-08-19 MED ORDER — LACTATED RINGERS IV SOLN
INTRAVENOUS | Status: DC
  Administered 2014-08-19 (×7): via INTRAVENOUS

## 2014-08-19 MED ORDER — MEPERIDINE HCL 25 MG/ML IJ SOLN
INTRAMUSCULAR | Status: DC | PRN
  Administered 2014-08-19 (×2): 12.5 mg via INTRAVENOUS

## 2014-08-19 MED ORDER — SENNOSIDES-DOCUSATE SODIUM 8.6-50 MG PO TABS
2.0000 | ORAL_TABLET | ORAL | Status: DC
Start: 2014-08-20 — End: 2014-08-21
  Administered 2014-08-20: 2 via ORAL
  Filled 2014-08-19: qty 2

## 2014-08-19 MED ORDER — SIMETHICONE 80 MG PO CHEW: 80.0000 mg | CHEWABLE_TABLET | ORAL | Status: DC | PRN

## 2014-08-19 MED ORDER — MISOPROSTOL 25 MCG QUARTER TABLET
25.0000 ug | ORAL_TABLET | ORAL | Status: DC | PRN
  Filled 2014-08-19: qty 0.25

## 2014-08-19 MED ORDER — DIPHENHYDRAMINE HCL 50 MG/ML IJ SOLN
12.5000 mg | INTRAMUSCULAR | Status: DC | PRN
  Administered 2014-08-19: 12.5 mg via INTRAVENOUS
  Filled 2014-08-19: qty 1

## 2014-08-19 MED ORDER — DIBUCAINE 1 % RE OINT: 1.0000 "application " | TOPICAL_OINTMENT | RECTAL | Status: DC | PRN

## 2014-08-19 MED ORDER — SODIUM BICARBONATE 8.4 % IV SOLN
INTRAVENOUS | Status: AC
  Filled 2014-08-19: qty 50

## 2014-08-19 MED ORDER — TERBUTALINE SULFATE 1 MG/ML IJ SOLN: 0.2500 mg | Freq: Once | INTRAMUSCULAR | Status: DC | PRN

## 2014-08-19 MED ORDER — PHENYLEPHRINE 40 MCG/ML (10ML) SYRINGE FOR IV PUSH (FOR BLOOD PRESSURE SUPPORT)
80.0000 ug | PREFILLED_SYRINGE | INTRAVENOUS | Status: DC | PRN
  Filled 2014-08-19: qty 20

## 2014-08-19 MED ORDER — DIPHENHYDRAMINE HCL 50 MG/ML IJ SOLN: 12.5000 mg | INTRAMUSCULAR | Status: DC | PRN

## 2014-08-19 MED ORDER — ZOLPIDEM TARTRATE 5 MG PO TABS
5.0000 mg | ORAL_TABLET | Freq: Every evening | ORAL | Status: DC | PRN
  Administered 2014-08-19: 5 mg via ORAL
  Filled 2014-08-19: qty 1

## 2014-08-19 MED ORDER — OXYTOCIN 40 UNITS IN LACTATED RINGERS INFUSION - SIMPLE MED
62.5000 mL/h | INTRAVENOUS | Status: DC
  Filled 2014-08-19: qty 1000

## 2014-08-19 MED ORDER — SODIUM BICARBONATE 8.4 % IV SOLN
INTRAVENOUS | Status: DC | PRN
  Administered 2014-08-19 (×2): 5 mL via EPIDURAL
  Administered 2014-08-19: 3 mL via EPIDURAL
  Administered 2014-08-19: 5 mL via EPIDURAL

## 2014-08-19 MED ORDER — OXYTOCIN 10 UNIT/ML IJ SOLN
INTRAMUSCULAR | Status: AC
  Filled 2014-08-19: qty 4

## 2014-08-19 MED ORDER — KETOROLAC TROMETHAMINE 30 MG/ML IJ SOLN: 30.0000 mg | Freq: Four times a day (QID) | INTRAMUSCULAR | Status: AC | PRN

## 2014-08-19 MED ORDER — LIDOCAINE HCL (PF) 1 % IJ SOLN: 30.0000 mL | INTRAMUSCULAR | Status: DC | PRN

## 2014-08-19 MED ORDER — ONDANSETRON HCL 4 MG/2ML IJ SOLN: 4.0000 mg | Freq: Four times a day (QID) | INTRAMUSCULAR | Status: DC | PRN

## 2014-08-19 MED ORDER — LACTATED RINGERS IV SOLN
INTRAVENOUS | Status: DC | PRN
  Administered 2014-08-19 (×2): via INTRAVENOUS

## 2014-08-19 MED ORDER — KETOROLAC TROMETHAMINE 30 MG/ML IJ SOLN
INTRAMUSCULAR | Status: AC
  Filled 2014-08-19: qty 1

## 2014-08-19 MED ORDER — CEFAZOLIN SODIUM-DEXTROSE 2-3 GM-% IV SOLR
INTRAVENOUS | Status: AC
  Filled 2014-08-19: qty 50

## 2014-08-19 MED ORDER — ONDANSETRON HCL 4 MG/2ML IJ SOLN: 4.0000 mg | Freq: Three times a day (TID) | INTRAMUSCULAR | Status: DC | PRN

## 2014-08-19 MED ORDER — ACETAMINOPHEN 325 MG PO TABS
650.0000 mg | ORAL_TABLET | ORAL | Status: DC | PRN
  Administered 2014-08-20: 650 mg via ORAL
  Filled 2014-08-19: qty 2

## 2014-08-19 MED ORDER — PHENYLEPHRINE 40 MCG/ML (10ML) SYRINGE FOR IV PUSH (FOR BLOOD PRESSURE SUPPORT)
PREFILLED_SYRINGE | INTRAVENOUS | Status: AC
  Filled 2014-08-19: qty 10

## 2014-08-19 MED ORDER — DIPHENHYDRAMINE HCL 25 MG PO CAPS
25.0000 mg | ORAL_CAPSULE | Freq: Four times a day (QID) | ORAL | Status: DC | PRN
  Administered 2014-08-20: 25 mg via ORAL
  Filled 2014-08-19: qty 1

## 2014-08-19 MED ORDER — OXYTOCIN 40 UNITS IN LACTATED RINGERS INFUSION - SIMPLE MED: 62.5000 mL/h | INTRAVENOUS | Status: AC

## 2014-08-19 MED ORDER — CITRIC ACID-SODIUM CITRATE 334-500 MG/5ML PO SOLN
30.0000 mL | ORAL | Status: DC | PRN
  Administered 2014-08-19 (×2): 30 mL via ORAL
  Filled 2014-08-19 (×3): qty 15

## 2014-08-19 MED ORDER — LANOLIN HYDROUS EX OINT: 1.0000 "application " | TOPICAL_OINTMENT | CUTANEOUS | Status: DC | PRN

## 2014-08-19 MED ORDER — FENTANYL CITRATE (PF) 100 MCG/2ML IJ SOLN: 25.0000 ug | INTRAMUSCULAR | Status: DC | PRN

## 2014-08-19 MED ORDER — IBUPROFEN 600 MG PO TABS: 600.0000 mg | ORAL_TABLET | Freq: Four times a day (QID) | ORAL | Status: DC | PRN

## 2014-08-19 MED ORDER — 0.9 % SODIUM CHLORIDE (POUR BTL) OPTIME
TOPICAL | Status: DC | PRN
  Administered 2014-08-19: 1000 mL

## 2014-08-19 MED ORDER — OXYTOCIN 40 UNITS IN LACTATED RINGERS INFUSION - SIMPLE MED
1.0000 m[IU]/min | INTRAVENOUS | Status: DC
  Administered 2014-08-19: 2 m[IU]/min via INTRAVENOUS

## 2014-08-19 MED ORDER — MORPHINE SULFATE (PF) 0.5 MG/ML IJ SOLN
INTRAMUSCULAR | Status: DC | PRN
  Administered 2014-08-19: 4 mg via EPIDURAL

## 2014-08-19 MED ORDER — OXYCODONE-ACETAMINOPHEN 5-325 MG PO TABS
2.0000 | ORAL_TABLET | ORAL | Status: DC | PRN
  Administered 2014-08-21: 2 via ORAL
  Filled 2014-08-19: qty 2

## 2014-08-19 MED ORDER — MENTHOL 3 MG MT LOZG: 1.0000 | LOZENGE | OROMUCOSAL | Status: DC | PRN

## 2014-08-19 MED ORDER — SIMETHICONE 80 MG PO CHEW
80.0000 mg | CHEWABLE_TABLET | ORAL | Status: DC
  Administered 2014-08-20: 80 mg via ORAL
  Filled 2014-08-19: qty 1

## 2014-08-19 MED ORDER — LACTATED RINGERS IV SOLN: INTRAVENOUS | Status: DC

## 2014-08-19 MED ORDER — SCOPOLAMINE 1 MG/3DAYS TD PT72
MEDICATED_PATCH | TRANSDERMAL | Status: AC
  Filled 2014-08-19: qty 1

## 2014-08-19 MED ORDER — BUTORPHANOL TARTRATE 1 MG/ML IJ SOLN: 1.0000 mg | INTRAMUSCULAR | Status: DC | PRN

## 2014-08-19 MED ORDER — KETOROLAC TROMETHAMINE 30 MG/ML IJ SOLN
30.0000 mg | Freq: Four times a day (QID) | INTRAMUSCULAR | Status: AC | PRN
  Administered 2014-08-19: 30 mg via INTRAMUSCULAR

## 2014-08-19 MED ORDER — ONDANSETRON HCL 4 MG/2ML IJ SOLN
INTRAMUSCULAR | Status: DC | PRN
  Administered 2014-08-19: 4 mg via INTRAVENOUS

## 2014-08-19 MED ORDER — LIDOCAINE HCL (PF) 1 % IJ SOLN
INTRAMUSCULAR | Status: DC | PRN
  Administered 2014-08-19 (×2): 8 mL

## 2014-08-19 MED ORDER — MEPERIDINE HCL 25 MG/ML IJ SOLN: 6.2500 mg | INTRAMUSCULAR | Status: DC | PRN

## 2014-08-19 MED ORDER — CEFAZOLIN SODIUM-DEXTROSE 2-3 GM-% IV SOLR
INTRAVENOUS | Status: DC | PRN
  Administered 2014-08-19: 2 g via INTRAVENOUS

## 2014-08-19 MED ORDER — NALOXONE HCL 1 MG/ML IJ SOLN
1.0000 ug/kg/h | INTRAMUSCULAR | Status: DC | PRN
  Filled 2014-08-19: qty 2

## 2014-08-19 MED ORDER — ONDANSETRON HCL 4 MG/2ML IJ SOLN
INTRAMUSCULAR | Status: AC
Start: 2014-08-19 — End: 2014-08-19
  Filled 2014-08-19: qty 2

## 2014-08-19 MED ORDER — LIDOCAINE-EPINEPHRINE (PF) 2 %-1:200000 IJ SOLN
INTRAMUSCULAR | Status: AC
  Filled 2014-08-19: qty 20

## 2014-08-19 MED ORDER — ACETAMINOPHEN 325 MG PO TABS: 650.0000 mg | ORAL_TABLET | ORAL | Status: DC | PRN

## 2014-08-19 MED ORDER — MORPHINE SULFATE 0.5 MG/ML IJ SOLN
INTRAMUSCULAR | Status: AC
  Filled 2014-08-19: qty 10

## 2014-08-19 MED ORDER — PRENATAL MULTIVITAMIN CH
1.0000 | ORAL_TABLET | Freq: Every day | ORAL | Status: DC
  Administered 2014-08-20 – 2014-08-21 (×2): 1 via ORAL
  Filled 2014-08-19 (×2): qty 1

## 2014-08-19 MED ORDER — ZOLPIDEM TARTRATE 5 MG PO TABS: 5.0000 mg | ORAL_TABLET | Freq: Every evening | ORAL | Status: DC | PRN

## 2014-08-19 MED ORDER — OXYCODONE-ACETAMINOPHEN 5-325 MG PO TABS
1.0000 | ORAL_TABLET | ORAL | Status: DC | PRN
  Administered 2014-08-20 – 2014-08-21 (×4): 1 via ORAL
  Filled 2014-08-19 (×4): qty 1

## 2014-08-19 MED ORDER — SCOPOLAMINE 1 MG/3DAYS TD PT72: 1.0000 | MEDICATED_PATCH | Freq: Once | TRANSDERMAL | Status: DC

## 2014-08-19 MED ORDER — VITAMIN K1 1 MG/0.5ML IJ SOLN
INTRAMUSCULAR | Status: AC
  Filled 2014-08-19: qty 0.5

## 2014-08-19 MED ORDER — EPHEDRINE 5 MG/ML INJ: 10.0000 mg | INTRAVENOUS | Status: DC | PRN

## 2014-08-19 MED ORDER — NALOXONE HCL 0.4 MG/ML IJ SOLN: 0.4000 mg | INTRAMUSCULAR | Status: DC | PRN

## 2014-08-19 MED ORDER — ZOLPIDEM TARTRATE 5 MG PO TABS
5.0000 mg | ORAL_TABLET | Freq: Once | ORAL | Status: AC
  Administered 2014-08-19: 5 mg via ORAL
  Filled 2014-08-19: qty 1

## 2014-08-19 MED ORDER — WITCH HAZEL-GLYCERIN EX PADS: 1.0000 "application " | MEDICATED_PAD | CUTANEOUS | Status: DC | PRN

## 2014-08-19 MED ORDER — DEXTROSE 5 % IV SOLN
2.0000 g | INTRAVENOUS | Status: AC
  Administered 2014-08-19: 2 g via INTRAVENOUS
  Filled 2014-08-19: qty 2

## 2014-08-19 MED ORDER — LACTATED RINGERS IV SOLN: 500.0000 mL | INTRAVENOUS | Status: DC | PRN

## 2014-08-19 MED ORDER — SIMETHICONE 80 MG PO CHEW
80.0000 mg | CHEWABLE_TABLET | Freq: Three times a day (TID) | ORAL | Status: DC
  Administered 2014-08-20 – 2014-08-21 (×5): 80 mg via ORAL
  Filled 2014-08-19 (×5): qty 1

## 2014-08-19 MED ORDER — IBUPROFEN 600 MG PO TABS
600.0000 mg | ORAL_TABLET | Freq: Four times a day (QID) | ORAL | Status: DC
  Administered 2014-08-20 – 2014-08-21 (×6): 600 mg via ORAL
  Filled 2014-08-19 (×6): qty 1

## 2014-08-19 MED ORDER — OXYTOCIN BOLUS FROM INFUSION: 500.0000 mL | INTRAVENOUS | Status: DC

## 2014-08-19 MED ORDER — LACTATED RINGERS IV SOLN
INTRAVENOUS | Status: DC
Start: 2014-08-19 — End: 2014-08-19
  Administered 2014-08-19: 18:00:00 via INTRAUTERINE

## 2014-08-19 MED ORDER — PHENYLEPHRINE HCL 10 MG/ML IJ SOLN
INTRAMUSCULAR | Status: DC | PRN
  Administered 2014-08-19: 40 ug via INTRAVENOUS
  Administered 2014-08-19 (×2): 80 ug via INTRAVENOUS

## 2014-08-19 MED ORDER — DIPHENHYDRAMINE HCL 25 MG PO CAPS
25.0000 mg | ORAL_CAPSULE | ORAL | Status: DC | PRN
  Administered 2014-08-20 – 2014-08-21 (×2): 25 mg via ORAL
  Filled 2014-08-19 (×2): qty 1

## 2014-08-19 MED ORDER — MEPERIDINE HCL 25 MG/ML IJ SOLN
INTRAMUSCULAR | Status: AC
  Filled 2014-08-19: qty 1

## 2014-08-19 MED ORDER — OXYTOCIN 10 UNIT/ML IJ SOLN
40.0000 [IU] | INTRAVENOUS | Status: DC | PRN
  Administered 2014-08-19: 40 [IU] via INTRAVENOUS

## 2014-08-19 MED ORDER — SODIUM CHLORIDE 0.9 % IJ SOLN: 3.0000 mL | INTRAMUSCULAR | Status: DC | PRN

## 2014-08-19 SURGICAL SUPPLY — 33 items
BENZOIN TINCTURE PRP APPL 2/3 (GAUZE/BANDAGES/DRESSINGS) ×2 IMPLANT
CLAMP CORD UMBIL (MISCELLANEOUS) IMPLANT
CLOTH BEACON ORANGE TIMEOUT ST (SAFETY) ×2 IMPLANT
DRAPE SHEET LG 3/4 BI-LAMINATE (DRAPES) IMPLANT
DRSG OPSITE POSTOP 4X10 (GAUZE/BANDAGES/DRESSINGS) ×2 IMPLANT
DURAPREP 26ML APPLICATOR (WOUND CARE) ×2 IMPLANT
ELECT REM PT RETURN 9FT ADLT (ELECTROSURGICAL) ×2
ELECTRODE REM PT RTRN 9FT ADLT (ELECTROSURGICAL) ×1 IMPLANT
EXTRACTOR VACUUM KIWI (MISCELLANEOUS) IMPLANT
GLOVE BIOGEL PI IND STRL 6.5 (GLOVE) ×1 IMPLANT
GLOVE BIOGEL PI INDICATOR 6.5 (GLOVE) ×1
GLOVE ECLIPSE 6.5 STRL STRAW (GLOVE) ×2 IMPLANT
GOWN STRL REUS W/TWL LRG LVL3 (GOWN DISPOSABLE) ×4 IMPLANT
KIT ABG SYR 3ML LUER SLIP (SYRINGE) IMPLANT
LIQUID BAND (GAUZE/BANDAGES/DRESSINGS) IMPLANT
NEEDLE HYPO 25X5/8 SAFETYGLIDE (NEEDLE) IMPLANT
NS IRRIG 1000ML POUR BTL (IV SOLUTION) ×2 IMPLANT
PACK C SECTION WH (CUSTOM PROCEDURE TRAY) ×2 IMPLANT
PAD ABD 7.5X8 STRL (GAUZE/BANDAGES/DRESSINGS) ×2 IMPLANT
PAD OB MATERNITY 4.3X12.25 (PERSONAL CARE ITEMS) ×2 IMPLANT
RTRCTR C-SECT PINK 25CM LRG (MISCELLANEOUS) ×2 IMPLANT
STRIP CLOSURE SKIN 1/2X4 (GAUZE/BANDAGES/DRESSINGS) ×2 IMPLANT
SUT PLAIN 0 NONE (SUTURE) IMPLANT
SUT PLAIN 2 0 XLH (SUTURE) IMPLANT
SUT VIC AB 0 CT1 27 (SUTURE) ×2
SUT VIC AB 0 CT1 27XBRD ANBCTR (SUTURE) ×2 IMPLANT
SUT VIC AB 0 CTX 36 (SUTURE) ×3
SUT VIC AB 0 CTX36XBRD ANBCTRL (SUTURE) ×3 IMPLANT
SUT VIC AB 2-0 CT1 27 (SUTURE) ×1
SUT VIC AB 2-0 CT1 TAPERPNT 27 (SUTURE) ×1 IMPLANT
SUT VIC AB 4-0 KS 27 (SUTURE) ×2 IMPLANT
TOWEL OR 17X24 6PK STRL BLUE (TOWEL DISPOSABLE) ×2 IMPLANT
TRAY FOLEY CATH SILVER 14FR (SET/KITS/TRAYS/PACK) IMPLANT

## 2014-08-19 NOTE — Addendum Note (Signed)
Addendum  created 08/19/14 2322 by Mal Amabile, MD   Modules edited: PRL Based Order Sets

## 2014-08-19 NOTE — Progress Notes (Addendum)
OB PN:  S: Pt resting comfortably, s/p epidural  O: BP 103/67 mmHg  Pulse 85  Temp(Src) 98 F (36.7 C) (Oral)  Resp 20  Ht 5\' 9"  (1.753 m)  Wt 87.091 kg (192 lb)  BMI 28.34 kg/m2  SpO2 98%  FHT: 130bpm, moderate variablity, + accels, rare variable decel Toco: q2-53min SVE: 3/75/-3, IUPC placed  CBC Latest Ref Rng 08/19/2014 07/16/2014 06/26/2013  WBC 4.0 - 10.5 K/uL 11.8(H) 9.5 8.8  Hemoglobin 12.0 - 15.0 g/dL 46.6 59.9 35.7  Hematocrit 36.0 - 46.0 % 40.4 37.7 42.9  Platelets 150 - 400 K/uL 143(L) 147(L) 193    A/P: 34 y.o. G1P0 @ [redacted]w[redacted]d who presents for IOL for postdates 1. FWB: Cat. II- pt repositioned, overall FHT reassuring 2. Labor: continue with Pit per protocol, IUPC placed to assess contractions Pain: epidural in place GBS: neg  ADDENDUM @ 1820:  Pt noted to have late and variable decels to 90bpm with spontaneous recovery.  Pt repositioned, FSE placed and plan to start amnioinfusion.  Myna Hidalgo, DO [phone] (pager) [phone] (office)

## 2014-08-19 NOTE — Op Note (Signed)
PreOp Diagnosis: 1) Intrauterine pregnancy @ [redacted]w[redacted]d 2) Failed induction of labor 3) Fetal intolerance to labor with non-reassuring fetal heart tones remote from delivery PostOp Diagnosis: same Procedure: Primary LTCS Surgeon: Dr. Myna Hidalgo Anesthesia: epidural Complications: none EBL: 900cc UOP: 100cc Fluids: 3300cc   Findings: Female infant from compound presentation, normal uterus, tubes and ovaries bilaterally, APGARs 9,10, weight pending.  PROCEDURE:  Informed consent was obtained from the patient with risks, benefits, complications, treatment options, and expected outcomes discussed with the patient.  The patient concurred with the proposed plan, giving informed consent with form signed.   The patient was taken to Operating Room, and identified with the procedure verified as C-Section Delivery with Time Out. With induction of anesthesia, the patient was prepped and draped in the usual sterile fashion. A Pfannenstiel incision was made and carried down through the subcutaneous tissue to the fascia. The fascia was incised in the midline and extended transversely. The superior aspect of the fascial incision was grasped with Kochers elevated and the underlying muscle dissected off. The inferior aspect of the facial incision was in similar fashion, grasped elevated and rectus muscles dissected off. The peritoneum was identified and entered. Peritoneal incision was extended longitudinally. The Alexis retractor was inserted.  The utero-vesical peritoneal reflection was identified and incised transversely with the [facility] scissors, the incision extended laterally, the bladder flap created digitally. A low transverse uterine incision was made and the infants head delivered atraumatically, cord noted wrapped around the foot. After the umbilical cord was clamped and cut cord blood was obtained for evaluation.   The placenta was removed intact and appeared normal. The uterine outline, tubes and ovaries  appeared normal. The uterine incision was closed with running locked sutures of 0 Vicryl and a second layer of the same stitch was used in an imbricating fashion.  Excellent hemostasis was obtained.  The pericolic gutters were then cleared of all clots and debris. The peritoneum was closed with 2-0 vicryl.  The fascia was then reapproximated with running sutures of 0 Vicryl. The skin was closed with 4-0 vicryl in a subcuticular fashion.  Instrument, sponge, and needle counts were correct prior the abdominal closure and at the conclusion of the case. The patient was taken to recovery in stable condition.  Myna Hidalgo, DO [phone] (pager) [phone] (office)

## 2014-08-19 NOTE — Consult Note (Signed)
Neonatology Note:   Attendance at C-section:    I was asked by Dr. Charlotta Newton to attend this primary C/S at [redacted] weeks GA due to fetal intolerance to labor and FTP. The mother is a G1P0 O pos, GBS neg with cigarette/e-cig use during pregnancy. ROM 8 hours prior to delivery, fluid with meconium. Amnioinfusion done.  Infant vigorous with good spontaneous cry and tone. Needed no suctioning. Ap 9/10. Lungs clear to ausc in DR. To CN to care of Pediatrician.   Doretha Sou, MD

## 2014-08-19 NOTE — Progress Notes (Signed)
Despite amnioinfusion and reposition pt continues to have recurrent late decels with each contraction. Due to NRFHT remote from delivery, plan for primary C-section.  Risk benefits and alternatives of cesarean section were discussed with the patient including but not limited to infection, bleeding, damage to bowel , bladder and baby with the need for further surgery. Pt voiced understanding and desires to proceed.   Myna Hidalgo, DO [phone] (pager) [phone] (office)

## 2014-08-19 NOTE — Anesthesia Preprocedure Evaluation (Addendum)
Anesthesia Evaluation  Patient identified by MRN, date of birth, ID band Patient awake    Reviewed: Allergy & Precautions, H&P , NPO status , Patient's Chart, lab work & pertinent test results  Airway Mallampati: I  TM Distance: >3 FB Neck ROM: full    Dental no notable dental hx.    Pulmonary Current Smoker,    Pulmonary exam normal       Cardiovascular negative cardio ROS Normal cardiovascular exam    Neuro/Psych negative neurological ROS  negative psych ROS   GI/Hepatic negative GI ROS, Neg liver ROS,   Endo/Other  negative endocrine ROS  Renal/GU negative Renal ROS     Musculoskeletal   Abdominal Normal abdominal exam  (+)   Peds  Hematology negative hematology ROS (+)   Anesthesia Other Findings   Reproductive/Obstetrics (+) Pregnancy                            Anesthesia Physical Anesthesia Plan  ASA: II and emergent  Anesthesia Plan: Epidural   Post-op Pain Management:    Induction:   Airway Management Planned: Natural Airway  Additional Equipment:   Intra-op Plan:   Post-operative Plan:   Informed Consent: I have reviewed the patients History and Physical, chart, labs and discussed the procedure including the risks, benefits and alternatives for the proposed anesthesia with the patient or authorized representative who has indicated his/her understanding and acceptance.   Dental advisory given  Plan Discussed with: Anesthesiologist, CRNA and Surgeon  Anesthesia Plan Comments: (Patient for urgent C/Section for non reassuring FHR tracing. Will use epidural for C/Section. M. Yago Ludvigsen,MD)       Anesthesia Quick Evaluation

## 2014-08-19 NOTE — Progress Notes (Signed)
OB PN:  S: Pt resting comfortably, no acute complaints  O: BP 110/53 mmHg  Pulse 99  Resp 20  Ht 5\' 9"  (1.753 m)  Wt 87.091 kg (192 lb)  BMI 28.34 kg/m2  FHT: 130bpm, moderate variablity, no accels, no decels Toco: irregular SVE: deferred  CBC Latest Ref Rng 08/19/2014 07/16/2014 06/26/2013  WBC 4.0 - 10.5 K/uL 11.8(H) 9.5 8.8  Hemoglobin 12.0 - 15.0 g/dL 58.0 99.8 33.8  Hematocrit 36.0 - 46.0 % 40.4 37.7 42.9  Platelets 150 - 400 K/uL 143(L) 147(L) 193    A/P: 34 y.o. G1P0 @ [redacted]w[redacted]d who presents for IOL for postdates 1. FWB: Cat. I 2. Labor: cytotec placed @ 0400, next dose @ 0800 Pain: IV pain medication as needed GBS: neg  Myna Hidalgo, DO [phone] (pager) [phone] (office)

## 2014-08-19 NOTE — Progress Notes (Signed)
OB PN:  S: Pt resting comfortably, s/p epidural  O: BP 110/69 mmHg  Pulse 79  Temp(Src) 98 F (36.7 C) (Oral)  Resp 20  Ht 5\' 9"  (1.753 m)  Wt 87.091 kg (192 lb)  BMI 28.34 kg/m2  SpO2 100%  FHT: 130bpm, moderate variablity, no accels, no decels Toco: irregular SVE: deferred  CBC Latest Ref Rng 08/19/2014 07/16/2014 06/26/2013  WBC 4.0 - 10.5 K/uL 11.8(H) 9.5 8.8  Hemoglobin 12.0 - 15.0 g/dL 47.8 29.5 62.1  Hematocrit 36.0 - 46.0 % 40.4 37.7 42.9  Platelets 150 - 400 K/uL 143(L) 147(L) 193    A/P: 34 y.o. G1P0 @ [redacted]w[redacted]d who presents for IOL for postdates 1. FWB: Cat. I 2. Labor: continue with Pit per protocol, s/p SROM Pain: epidural in place GBS: neg  Myna Hidalgo, DO [phone] (pager) [phone] (office)

## 2014-08-19 NOTE — Anesthesia Procedure Notes (Signed)
Epidural Patient location during procedure: OB Start time: 08/19/2014 1:01 PM End time: 08/19/2014 1:05 PM  Staffing Anesthesiologist: Leilani Able Performed by: anesthesiologist   Preanesthetic Checklist Completed: patient identified, surgical consent, pre-op evaluation, timeout performed, IV checked, risks and benefits discussed and monitors and equipment checked  Epidural Patient position: sitting Prep: site prepped and draped and DuraPrep Patient monitoring: continuous pulse ox and blood pressure Approach: midline Location: L3-L4 Injection technique: LOR air  Needle:  Needle type: Tuohy  Needle gauge: 17 G Needle length: 9 cm and 9 Needle insertion depth: 5 cm cm Catheter type: closed end flexible Catheter size: 19 Gauge Catheter at skin depth: 10 cm Test dose: negative and Other  Assessment Sensory level: T11 Events: blood not aspirated, injection not painful, no injection resistance, negative IV test and no paresthesia  Additional Notes Reason for block:procedure for pain

## 2014-08-19 NOTE — Progress Notes (Signed)
cytotec 25mg  given unable to chart on MAR. Citrix down

## 2014-08-19 NOTE — Anesthesia Postprocedure Evaluation (Signed)
  Anesthesia Post-op Note  Patient: Mia Rubio  Procedure(s) Performed: Procedure(s): CESAREAN SECTION (N/A)  Patient Location: PACU  Anesthesia Type:Epidural  Level of Consciousness: awake, alert  and oriented  Airway and Oxygen Therapy: Patient Spontanous Breathing  Post-op Pain: none  Post-op Assessment: Post-op Vital signs reviewed, Patient's Cardiovascular Status Stable, Respiratory Function Stable, Patent Airway, No signs of Nausea or vomiting, Pain level controlled, No headache, No backache and Spinal receding              Post-op Vital Signs: Reviewed and stable  Last Vitals:  Filed Vitals:   08/19/14 2115  BP: 97/64  Pulse: 72  Temp:   Resp: 19    Complications: No apparent anesthesia complications

## 2014-08-19 NOTE — Progress Notes (Signed)
Sodium Citrate given for heartburn. Unable to document on MAR due to system downtime.

## 2014-08-19 NOTE — Transfer of Care (Signed)
Immediate Anesthesia Transfer of Care Note  Patient: Mia Rubio  Procedure(s) Performed: Procedure(s): CESAREAN SECTION (N/A)  Patient Location: PACU  Anesthesia Type:Epidural  Level of Consciousness: awake, alert , oriented and patient cooperative  Airway & Oxygen Therapy: Patient Spontanous Breathing  Post-op Assessment: Report given to RN and Post -op Vital signs reviewed and stable  Post vital signs: Reviewed and stable  Last Vitals:  BP 107/64 HR 74 TEMP 97.6 RR 17 POx 98  Complications: No apparent anesthesia complications

## 2014-08-20 LAB — CBC
HCT: 34.3 % — ABNORMAL LOW (ref 36.0–46.0)
Hemoglobin: 11.7 g/dL — ABNORMAL LOW (ref 12.0–15.0)
MCH: 31.3 pg (ref 26.0–34.0)
MCHC: 34.1 g/dL (ref 30.0–36.0)
MCV: 91.7 fL (ref 78.0–100.0)
Platelets: 103 10*3/uL — ABNORMAL LOW (ref 150–400)
RBC: 3.74 MIL/uL — AB (ref 3.87–5.11)
RDW: 14 % (ref 11.5–15.5)
WBC: 10.8 10*3/uL — AB (ref 4.0–10.5)

## 2014-08-20 MED ORDER — NALBUPHINE HCL 10 MG/ML IJ SOLN
5.0000 mg | INTRAMUSCULAR | Status: DC | PRN
  Administered 2014-08-20 (×2): 5 mg via SUBCUTANEOUS
  Filled 2014-08-20 (×2): qty 1

## 2014-08-20 MED ORDER — DIPHENHYDRAMINE HCL 25 MG PO CAPS: 25.0000 mg | ORAL_CAPSULE | Freq: Four times a day (QID) | ORAL | Status: DC | PRN

## 2014-08-20 NOTE — Addendum Note (Signed)
Addendum  created 08/20/14 0908 by Shanon Payor, CRNA   Modules edited: Notes Section   Notes Section:  File: [id number]

## 2014-08-20 NOTE — Anesthesia Postprocedure Evaluation (Signed)
  Anesthesia Post-op Note  Patient: Mia Rubio  Procedure(s) Performed: Procedure(s): CESAREAN SECTION (N/A)  Patient Location: Mother/Baby  Anesthesia Type:Epidural  Level of Consciousness: awake, alert  and oriented  Airway and Oxygen Therapy: Patient Spontanous Breathing  Post-op Pain: none  Post-op Assessment: Post-op Vital signs reviewed, Patient's Cardiovascular Status Stable, Respiratory Function Stable, Pain level controlled, No headache and No backache              Post-op Vital Signs: Reviewed and stable  Last Vitals:  Filed Vitals:   08/20/14 0515  BP: 115/77  Pulse: 77  Temp:   Resp:     Complications: No apparent anesthesia complications

## 2014-08-20 NOTE — Progress Notes (Signed)
I assumed care of this patient at 1000. Pt stated she is "exhausted" and cannot get the baby to sleep in his bassinet, and she is falling asleep as she is holding him. Together we decided that the bath would done in the nursery. Baby taken to nursery for a bath under a warmer.

## 2014-08-20 NOTE — Lactation Note (Signed)
This note was copied from the chart of [employer] laboratory technician. Lactation Consultation Note New mom had c-section and has been sleep deprived. Very sleepy, eyes crossing trying to stay awake. FOB at bedside listening to teaching. Assisted in latching baby to breast in football hold to large everted raspberry nipples. Fits in baby's mouth well just keeps coming off. Discussed props and positioning for deep latch. Mom keeps saying it hurts at intervals. relatch and has slanted nipples. Kept adjusting flange and getting baby closer. Mom encouraged to feed baby 8-12 times/24 hours and with feeding cues. Mom encouraged to waken baby for feeds. Referred to Baby and Me Book in Breastfeeding section Pg. 22-23 for position options and Proper latch demonstration. Educated about newborn behavior. Mom encouraged to do skin-to-skin.Encouraged comfort during BF so colostrum flows better and mom will enjoy the feeding longer. Taking deep breaths and breast massage during BF. WH/LC brochure given w/resources, support groups and LC services. Discussed I&O and supply and demand. Patient Name: Mia Rubio Today's Date: 08/20/2014 Reason for consult: Initial assessment   Maternal Data Has patient been taught Hand Expression?: Yes Does the patient have breastfeeding experience prior to this delivery?: No  Feeding Feeding Type: Breast Fed Length of feed: 15 min (still BF)  LATCH Score/Interventions Latch: Repeated attempts needed to sustain latch, nipple held in mouth throughout feeding, stimulation needed to elicit sucking reflex. Intervention(s): Adjust position;Assist with latch;Breast massage;Breast compression  Audible Swallowing: Spontaneous and intermittent Intervention(s): Skin to skin;Hand expression  Type of Nipple: Everted at rest and after stimulation  Comfort (Breast/Nipple): Soft / non-tender     Hold (Positioning): Assistance needed to correctly position infant at breast and maintain  latch. Intervention(s): Skin to skin;Position options;Support Pillows;Breastfeeding basics reviewed  LATCH Score: 8  Lactation Tools Discussed/Used Tools: Nipple Shields Nipple shield size:  (No NIpple shiels)   Consult Status Consult Status: Follow-up Date: 08/21/14 Follow-up type: In-patient    Charyl Dancer 08/20/2014, 4:57 AM

## 2014-08-20 NOTE — Progress Notes (Signed)
Patient c/o itching.  25mg  Benadryl PO given.

## 2014-08-20 NOTE — Progress Notes (Signed)
UR chart review completed.  

## 2014-08-20 NOTE — Progress Notes (Signed)
Postoperative Note Day # 1  S:  Patient resting comfortable in bed.  Pain controlled.  Tolerating general. No flatus, no BM.  Lochia minimal.  Ambulating without difficulty.  She denies n/v/f/c, SOB, or CP.  Pt plans on breastfeeding.  Foley in place.  O: Temp:  [97.6 F (36.4 C)-98.7 F (37.1 C)] 98.5 F (36.9 C) (06/24 0500) Pulse Rate:  [61-99] 77 (06/24 0515) Resp:  [18-47] 18 (06/24 0500) BP: (81-130)/(53-97) 115/77 mmHg (06/24 0515) SpO2:  [91 %-100 %] 94 % (06/24 0500) Gen: A&Ox3, NAD CV: RRR, no MRG Resp: CTAB Abdomen: soft, NT, ND hypoactive BS Uterus: firm, appropriately tender, below umbilicus Incision: honeycomb dressing in place with old blood, clean and dry this am Ext: 1+ pitting peda edema, no calf tenderness bilaterally, SCDs in place  Labs:  Recent Labs  08/19/14 0300 08/20/14 0505  HGB 14.7 11.7*    A/P: Pt is a 34 y.o. G1P1001 s/p primary  C-section, POD#1  - Pain well controlled -GU: UOP is adequate, foley to be removed today -GI: Tolerating general diet -Activity: encouraged sitting up to chair and ambulation as tolerated -Prophylaxis: SCDs while in bed, encourage early ambulation -Labs: stable as above -Plan for outpatient circ  Continue with routine postoperative care  Myna Hidalgo, DO [phone] (pager) [phone] (office)

## 2014-08-21 ENCOUNTER — Encounter (HOSPITAL_COMMUNITY): Payer: Self-pay | Admitting: Obstetrics & Gynecology

## 2014-08-21 LAB — RPR: RPR Ser Ql: NONREACTIVE

## 2014-08-21 MED ORDER — FERROUS SULFATE 325 (65 FE) MG PO TBEC: 325.0000 mg | DELAYED_RELEASE_TABLET | Freq: Two times a day (BID) | ORAL | Status: DC

## 2014-08-21 MED ORDER — IBUPROFEN 600 MG PO TABS: 600.0000 mg | ORAL_TABLET | Freq: Four times a day (QID) | ORAL | Status: DC | PRN

## 2014-08-21 MED ORDER — MEASLES, MUMPS & RUBELLA VAC ~~LOC~~ INJ
0.5000 mL | INJECTION | Freq: Once | SUBCUTANEOUS | Status: AC
  Administered 2014-08-21: 0.5 mL via SUBCUTANEOUS
  Filled 2014-08-21: qty 0.5

## 2014-08-21 MED ORDER — OXYCODONE-ACETAMINOPHEN 5-325 MG PO TABS: 1.0000 | ORAL_TABLET | ORAL | Status: DC | PRN

## 2014-08-21 NOTE — Lactation Note (Signed)
This note was copied from the chart of [employer] laboratory technician. Lactation Consultation Note: Mom reports that baby has been latching well. Using a natural nipple cream- encouraged to use breast milk on nipples first. Reports breasts are feeling a little fuller today. Has friends Medela. pump for home use. Reviewed BFGS and OP appointments as resources for support after DC. To call prn  Patient Name: Mia Rubio Date: 08/21/2014 Reason for consult: Follow-up assessment   Maternal Data Formula Feeding for Exclusion: No Has patient been taught Hand Expression?: Yes Does the patient have breastfeeding experience prior to this delivery?: No  Feeding Feeding Type: Breast Fed Length of feed: 20 min  LATCH Score/Interventions Latch: Grasps breast easily, tongue down, lips flanged, rhythmical sucking.  Audible Swallowing: Spontaneous and intermittent Intervention(s): Skin to skin  Type of Nipple: Everted at rest and after stimulation  Comfort (Breast/Nipple): Soft / non-tender     Hold (Positioning): No assistance needed to correctly position infant at breast. Intervention(s): Breastfeeding basics reviewed;Skin to skin  LATCH Score: 10  Lactation Tools Discussed/Used     Consult Status Consult Status: Complete    Pamelia Hoit 08/21/2014, 10:00 AM

## 2014-08-21 NOTE — Discharge Summary (Signed)
Cesarean Section Delivery Discharge Summary  Mia Rubio  DOB:    1981-02-18 MRN:    [id number] CSN:    [id number]  Date of admission:                  08/19/14  Date of discharge:                   08/21/14  Procedures this admission:  CS  Date of Delivery: 08/19/14  Newborn Data:  Live born  Information for the patient's newborn:  Uyen, Eichholz [[id number]]  female   Live born female  Birth Weight: 8 lb 4.1 oz (3745 g) APGAR: 9, 10  Home with mother. Name: Reef Circumcision Plan: out patient  History of Present Illness:  Mia Rubio is a 34 y.o. female, G1P1001, who presents at [redacted]w[redacted]d weeks gestation. The patient has been followed at the [facility] and Gynecology division of [facility].    Her pregnancy has been complicated by:  Patient Active Problem List   Diagnosis Date Noted  . Intrauterine normal pregnancy 08/19/2014    Hospital course: The patient was admitted for IOL.   She was given cytotec and then pitocin. Her cervix was 3 cm. She had recurrent late decels in spite of amnioinfusion. A CS was recommended and accepted. Her surgery was uncomplicated. Her postpartum course was not complicated.  She was discharged to home on postpartum day 2 doing well.  Feeding: breast  Contraception: not sure, considering Micronor   Discharge hemoglobin: HEMOGLOBIN  Date Value Ref Range Status  08/20/2014 11.7* 12.0 - 15.0 g/dL Final    Comment:    DELTA CHECK NOTED REPEATED TO VERIFY    HGB  Date Value Ref Range Status  06/26/2013 14.5 11.6 - 15.9 g/dL Final   HCT  Date Value Ref Range Status  08/20/2014 34.3* 36.0 - 46.0 % Final  06/26/2013 42.9 34.8 - 46.6 % Final   Anemia - hemodynamicly stable.    PreNatal Labs ABO, Rh: --/--/O POS, O POS (06/23 0300)   Antibody: NEG (06/23 0300) Rubella:    non immune  MMR to be given prior to DC RPR: Non Reactive (06/24 0505)  HBsAg: Negative (10/26 0000)  HIV:  Non-reactive (10/26 0000)  GBS: Negative (06/14 0000)  Discharge Physical Exam:  General: alert and cooperative Lochia: appropriate Uterine Fundus: firm Incision: healing well DVT Evaluation: No evidence of DVT seen on physical exam.  Intrapartum Procedures: cesarean: low cervical, transverse Postpartum Procedures: Rubella Ig Complications-Operative and Postpartum: none  Discharge Diagnoses: Term Pregnancy-delivered and Failed induction,  asymptomatic anemia    Activity:           pelvic rest Diet:                routine Medications: PNV, Ibuprofen, Iron and oxyCODONE-acetaminophen (PERCOCET/ROXICET) - Pt denies reaction during pp stay Condition:      stable   Discharge to: home     Venus Standard, CNM, MSN  08/21/2014. 8:59 AM  Care After Cesarean Delivery  Refer to this sheet in the next few weeks. These instructions provide you with information on caring for yourself after your procedure. Your caregiver may also give you specific instructions. Your treatment has been planned according to current medical practices, but problems sometimes occur. Call your caregiver if you have any problems or questions after you go home. HOME CARE INSTRUCTIONS  Only take over-the-counter or prescription medicines as directed by your  caregiver.  Do not drink alcohol, especially if you are breastfeeding or taking medicine to relieve pain.  Do not chew or smoke tobacco.  Continue to use good perineal care. Good perineal care includes:  Wiping your perineum from front to back.  Keeping your perineum clean.  Check your cut (incision) daily for increased redness, drainage, swelling, or separation of skin.  Clean your incision gently with soap and water every day, and then pat it dry. If your caregiver says it is okay, leave the incision uncovered. Use a bandage (dressing) if the incision is draining fluid or appears irritated. If the adhesive strips across the incision do not fall off  within 7 days, carefully peel them off.  Hug a pillow when coughing or sneezing until your incision is healed. This helps to relieve pain.  Do not use tampons or douche until your caregiver says it is okay.  Shower, wash your hair, and take tub baths as directed by your caregiver.  Wear a well-fitting bra that provides breast support.  Limit wearing support panties or control-top hose.  Drink enough fluids to keep your urine clear or pale yellow.  Eat high-fiber foods such as whole grain cereals and breads, brown rice, beans, and fresh fruits and vegetables every day. These foods may help prevent or relieve constipation.  Resume activities such as climbing stairs, driving, lifting, exercising, or traveling as directed by your caregiver.  Talk to your caregiver about resuming sexual activities. This is dependent upon your risk of infection, your rate of healing, and your comfort and desire to resume sexual activity.  Try to have someone help you with your household activities and your newborn for at least a few days after you leave the hospital.  Rest as much as possible. Try to rest or take a nap when your newborn is sleeping.  Increase your activities gradually.  Keep all of your scheduled postpartum appointments. It is very important to keep your scheduled follow-up appointments. At these appointments, your caregiver will be checking to make sure that you are healing physically and emotionally. SEEK MEDICAL CARE IF:   You are passing large clots from your vagina. Save any clots to show your caregiver.  You have a foul smelling discharge from your vagina.  You have trouble urinating.  You are urinating frequently.  You have pain when you urinate.  You have a change in your bowel movements.  You have increasing redness, pain, or swelling near your incision.  You have pus draining from your incision.  Your incision is separating.  You have painful, hard, or reddened  breasts.  You have a severe headache.  You have blurred vision or see spots.  You feel sad or depressed.  You have thoughts of hurting yourself or your newborn.  You have questions about your care, the care of your newborn, or medicines.  You are dizzy or lightheaded.  You have a rash.  You have pain, redness, or swelling at the site of the removed intravenous access (IV) tube.  You have nausea or vomiting.  You stopped breastfeeding and have not had a menstrual period within 12 weeks of stopping.  You are not breastfeeding and have not had a menstrual period within 12 weeks of delivery.  You have a fever. SEEK IMMEDIATE MEDICAL CARE IF:  You have persistent pain.  You have chest pain.  You have shortness of breath.  You faint.  You have leg pain.  You have stomach pain.  Your vaginal  bleeding saturates 2 or more sanitary pads in 1 hour. MAKE SURE YOU:   Understand these instructions.  Will watch your condition.  Will get help right away if you are not doing well or get worse. Document Released: 11/04/2001 Document Revised: 11/07/2011 Document Reviewed: 10/10/2011 [facility] Patient Information 2014 [address].   Postpartum Depression and Baby Blues  The postpartum period begins right after the birth of a baby. During this time, there is often a great amount of joy and excitement. It is also a time of considerable changes in the life of the parent(s). Regardless of how many times a mother gives birth, each child brings new challenges and dynamics to the family. It is not unusual to have feelings of excitement accompanied by confusing shifts in moods, emotions, and thoughts. All mothers are at risk of developing postpartum depression or the "baby blues." These mood changes can occur right after giving birth, or they may occur many months after giving birth. The baby blues or postpartum depression can be mild or severe. Additionally, postpartum depression can  resolve rather quickly, or it can be a long-term condition. CAUSES Elevated hormones and their rapid decline are thought to be a main cause of postpartum depression and the baby blues. There are a number of hormones that radically change during and after pregnancy. Estrogen and progesterone usually decrease immediately after delivering your baby. The level of thyroid hormone and various cortisol steroids also rapidly drop. Other factors that play a major role in these changes include major life events and genetics.  RISK FACTORS If you have any of the following risks for the baby blues or postpartum depression, know what symptoms to watch out for during the postpartum period. Risk factors that may increase the likelihood of getting the baby blues or postpartum depression include: 1. Havinga personal or family history of depression. 2. Having depression while being pregnant. 3. Having premenstrual or oral contraceptive-associated mood issues. 4. Having exceptional life stress. 5. Having marital conflict. 6. Lacking a social support network. 7. Having a baby with special needs. 8. Having health problems such as diabetes. SYMPTOMS Baby blues symptoms include:  Brief fluctuations in mood, such as going from extreme happiness to sadness.  Decreased concentration.  Difficulty sleeping.  Crying spells, tearfulness.  Irritability.  Anxiety. Postpartum depression symptoms typically begin within the first month after giving birth. These symptoms include:  Difficulty sleeping or excessive sleepiness.  Marked weight loss.  Agitation.  Feelings of worthlessness.  Lack of interest in activity or food. Postpartum psychosis is a very concerning condition and can be dangerous. Fortunately, it is rare. Displaying any of the following symptoms is cause for immediate medical attention. Postpartum psychosis symptoms include:  Hallucinations and delusions.  Bizarre or disorganized  behavior.  Confusion or disorientation. DIAGNOSIS  A diagnosis is made by an evaluation of your symptoms. There are no medical or lab tests that lead to a diagnosis, but there are various questionnaires that a caregiver may use to identify those with the baby blues, postpartum depression, or psychosis. Often times, a screening tool called the [place] Postnatal Depression Scale is used to diagnose depression in the postpartum period.  TREATMENT The baby blues usually goes away on its own in 1 to 2 weeks. Social support is often all that is needed. You should be encouraged to get adequate sleep and rest. Occasionally, you may be given medicines to help you sleep.  Postpartum depression requires treatment as it can last several months or longer if  it is not treated. Treatment may include individual or group therapy, medicine, or both to address any social, physiological, and psychological factors that may play a role in the depression. Regular exercise, a healthy diet, rest, and social support may also be strongly recommended.  Postpartum psychosis is more serious and needs treatment right away. Hospitalization is often needed. HOME CARE INSTRUCTIONS  Get as much rest as you can. Nap when the baby sleeps.  Exercise regularly. Some women find yoga and walking to be beneficial.  Eat a balanced and nourishing diet.  Do little things that you enjoy. Have a cup of tea, take a bubble bath, read your favorite magazine, or listen to your favorite music.  Avoid alcohol.  Ask for help with household chores, cooking, grocery shopping, or running errands as needed. Do not try to do everything.  Talk to people close to you about how you are feeling. Get support from your partner, family members, friends, or other new moms.  Try to stay positive in how you think. Think about the things you are grateful for.  Do not spend a lot of time alone.  Only take medicine as directed by your caregiver.  Keep  all your postpartum appointments.  Let your caregiver know if you have any concerns. SEEK MEDICAL CARE IF: You are having a reaction or problems with your medicine. SEEK IMMEDIATE MEDICAL CARE IF:  You have suicidal feelings.  You feel you may harm the baby or someone else. Document Released: 11/17/2003 Document Revised: 05/07/2011 Document Reviewed: 12/19/2010 [facility] Patient Information 2014 [place], [place].   Breastfeeding Deciding to breastfeed is one of the best choices you can make for you and your baby. A change in hormones during pregnancy causes your breast tissue to grow and increases the number and size of your milk ducts. These hormones also allow proteins, sugars, and fats from your blood supply to make breast milk in your milk-producing glands. Hormones prevent breast milk from being released before your baby is born as well as prompt milk flow after birth. Once breastfeeding has begun, thoughts of your baby, as well as his or her sucking or crying, can stimulate the release of milk from your milk-producing glands.  BENEFITS OF BREASTFEEDING For Your Baby  Your first milk (colostrum) helps your baby's digestive system function better.   There are antibodies in your milk that help your baby fight off infections.   Your baby has a lower incidence of asthma, allergies, and sudden infant death syndrome.   The nutrients in breast milk are better for your baby than infant formulas and are designed uniquely for your baby's needs.   Breast milk improves your baby's brain development.   Your baby is less likely to develop other conditions, such as childhood obesity, asthma, or type 2 diabetes mellitus.  For You   Breastfeeding helps to create a very special bond between you and your baby.   Breastfeeding is convenient. Breast milk is always available at the correct temperature and costs nothing.   Breastfeeding helps to burn calories and helps you lose the weight  gained during pregnancy.   Breastfeeding makes your uterus contract to its prepregnancy size faster and slows bleeding (lochia) after you give birth.   Breastfeeding helps to lower your risk of developing type 2 diabetes mellitus, osteoporosis, and breast or ovarian cancer later in life. SIGNS THAT YOUR BABY IS HUNGRY Early Signs of Hunger  Increased alertness or activity.  Stretching.  Movement of the head from  side to side.  Movement of the head and opening of the mouth when the corner of the mouth or cheek is stroked (rooting).  Increased sucking sounds, smacking lips, cooing, sighing, or squeaking.  Hand-to-mouth movements.  Increased sucking of fingers or hands. Late Signs of Hunger  Fussing.  Intermittent crying. Extreme Signs of Hunger Signs of extreme hunger will require calming and consoling before your baby will be able to breastfeed successfully. Do not wait for the following signs of extreme hunger to occur before you initiate breastfeeding:   Restlessness.  A loud, strong cry.   Screaming.  BREASTFEEDING BASICS Breastfeeding Initiation  Find a comfortable place to sit or lie down, with your neck and back well supported.  Place a pillow or rolled up blanket under your baby to bring him or her to the level of your breast (if you are seated). Nursing pillows are specially designed to help support your arms and your baby while you breastfeed.  Make sure that your baby's abdomen is facing your abdomen.   Gently massage your breast. With your fingertips, massage from your chest wall toward your nipple in a circular motion. This encourages milk flow. You may need to continue this action during the feeding if your milk flows slowly.  Support your breast with 4 fingers underneath and your thumb above your nipple. Make sure your fingers are well away from your nipple and your baby's mouth.   Stroke your baby's lips gently with your finger or nipple.   When  your baby's mouth is open wide enough, quickly bring your baby to your breast, placing your entire nipple and as much of the colored area around your nipple (areola) as possible into your baby's mouth.   More areola should be visible above your baby's upper lip than below the lower lip.   Your baby's tongue should be between his or her lower gum and your breast.   Ensure that your baby's mouth is correctly positioned around your nipple (latched). Your baby's lips should create a seal on your breast and be turned out (everted).  It is common for your baby to suck about 2-3 minutes in order to start the flow of breast milk. Latching Teaching your baby how to latch on to your breast properly is very important. An improper latch can cause nipple pain and decreased milk supply for you and poor weight gain in your baby. Also, if your baby is not latched onto your nipple properly, he or she may swallow some air during feeding. This can make your baby fussy. Burping your baby when you switch breasts during the feeding can help to get rid of the air. However, teaching your baby to latch on properly is still the best way to prevent fussiness from swallowing air while breastfeeding. Signs that your baby has successfully latched on to your nipple:    Silent tugging or silent sucking, without causing you pain.   Swallowing heard between every 3-4 sucks.    Muscle movement above and in front of his or her ears while sucking.  Signs that your baby has not successfully latched on to nipple:   Sucking sounds or smacking sounds from your baby while breastfeeding.  Nipple pain. If you think your baby has not latched on correctly, slip your finger into the corner of your baby's mouth to break the suction and place it between your baby's gums. Attempt breastfeeding initiation again. Signs of Successful Breastfeeding Signs from your baby:   A gradual  decrease in the number of sucks or complete cessation  of sucking.   Falling asleep.   Relaxation of his or her body.   Retention of a small amount of milk in his or her mouth.   Letting go of your breast by himself or herself. Signs from you:  Breasts that have increased in firmness, weight, and size 1-3 hours after feeding.   Breasts that are softer immediately after breastfeeding.  Increased milk volume, as well as a change in milk consistency and color by the fifth day of breastfeeding.   Nipples that are not sore, cracked, or bleeding. Signs That Your [employer] is Getting Enough Milk  Wetting at least 3 diapers in a 24-hour period. The urine should be clear and pale yellow by age 81 days.  At least 3 stools in a 24-hour period by age 81 days. The stool should be soft and yellow.  At least 3 stools in a 24-hour period by age 82 days. The stool should be seedy and yellow.  No loss of weight greater than 10% of birth weight during the first 64 days of age.  Average weight gain of 4-7 ounces (113-198 g) per week after age 8 days.  Consistent daily weight gain by age 63 days, without weight loss after the age of 2 weeks. After a feeding, your baby may spit up a small amount. This is common. BREASTFEEDING FREQUENCY AND DURATION Frequent feeding will help you make more milk and can prevent sore nipples and breast engorgement. Breastfeed when you feel the need to reduce the fullness of your breasts or when your baby shows signs of hunger. This is called "breastfeeding on demand." Avoid introducing a pacifier to your baby while you are working to establish breastfeeding (the first 4-6 weeks after your baby is born). After this time you may choose to use a pacifier. Research has shown that pacifier use during the first year of a baby's life decreases the risk of sudden infant death syndrome (SIDS). Allow your baby to feed on each breast as long as he or she wants. Breastfeed until your baby is finished feeding. When your baby unlatches or falls  asleep while feeding from the first breast, offer the second breast. Because newborns are often sleepy in the first few weeks of life, you may need to awaken your baby to get him or her to feed. Breastfeeding times will vary from baby to baby. However, the following rules can serve as a guide to help you ensure that your baby is properly fed:  Newborns (babies 30 weeks of age or younger) may breastfeed every 1-3 hours.  Newborns should not go longer than 3 hours during the day or 5 hours during the night without breastfeeding.  You should breastfeed your baby a minimum of 8 times in a 24-hour period until you begin to introduce solid foods to your baby at around 52 months of age. BREAST MILK PUMPING Pumping and storing breast milk allows you to ensure that your baby is exclusively fed your breast milk, even at times when you are unable to breastfeed. This is especially important if you are going back to work while you are still breastfeeding or when you are not able to be present during feedings. Your lactation consultant can give you guidelines on how long it is safe to store breast milk.  A breast pump is a machine that allows you to pump milk from your breast into a sterile bottle. The pumped breast milk can  then be stored in a refrigerator or freezer. Some breast pumps are operated by hand, while others use electricity. Ask your lactation consultant which type will work best for you. Breast pumps can be purchased, but some hospitals and breastfeeding support groups lease breast pumps on a monthly basis. A lactation consultant can teach you how to hand express breast milk, if you prefer not to use a pump.  CARING FOR YOUR BREASTS WHILE YOU BREASTFEED Nipples can become dry, cracked, and sore while breastfeeding. The following recommendations can help keep your breasts moisturized and healthy:  Avoid using soap on your nipples.   Wear a supportive bra. Although not required, special nursing bras and  tank tops are designed to allow access to your breasts for breastfeeding without taking off your entire bra or top. Avoid wearing underwire-style bras or extremely tight bras.  Air dry your nipples for 3-9minutes after each feeding.   Use only cotton bra pads to absorb leaked breast milk. Leaking of breast milk between feedings is normal.   Use lanolin on your nipples after breastfeeding. Lanolin helps to maintain your skin's normal moisture barrier. If you use pure lanolin, you do not need to wash it off before feeding your baby again. Pure lanolin is not toxic to your baby. You may also hand express a few drops of breast milk and gently massage that milk into your nipples and allow the milk to air dry. In the first few weeks after giving birth, some women experience extremely full breasts (engorgement). Engorgement can make your breasts feel heavy, warm, and tender to the touch. Engorgement peaks within 3-5 days after you give birth. The following recommendations can help ease engorgement:  Completely empty your breasts while breastfeeding or pumping. You may want to start by applying warm, moist heat (in the shower or with warm water-soaked hand towels) just before feeding or pumping. This increases circulation and helps the milk flow. If your baby does not completely empty your breasts while breastfeeding, pump any extra milk after he or she is finished.  Wear a snug bra (nursing or regular) or tank top for 1-2 days to signal your body to slightly decrease milk production.  Apply ice packs to your breasts, unless this is too uncomfortable for you.  Make sure that your baby is latched on and positioned properly while breastfeeding. If engorgement persists after 48 hours of following these recommendations, contact your health care provider or a Science writer. OVERALL HEALTH CARE RECOMMENDATIONS WHILE BREASTFEEDING  Eat healthy foods. Alternate between meals and snacks, eating 3 of each  per day. Because what you eat affects your breast milk, some of the foods may make your baby more irritable than usual. Avoid eating these foods if you are sure that they are negatively affecting your baby.  Drink milk, fruit juice, and water to satisfy your thirst (about 10 glasses a day).   Rest often, relax, and continue to take your prenatal vitamins to prevent fatigue, stress, and anemia.  Continue breast self-awareness checks.  Avoid chewing and smoking tobacco.  Avoid alcohol and drug use. Some medicines that may be harmful to your baby can pass through breast milk. It is important to ask your health care provider before taking any medicine, including all over-the-counter and prescription medicine as well as vitamin and herbal supplements. It is possible to become pregnant while breastfeeding. If birth control is desired, ask your health care provider about options that will be safe for your baby. [place]  IF:   You feel like you want to stop breastfeeding or have become frustrated with breastfeeding.  You have painful breasts or nipples.  Your nipples are cracked or bleeding.  Your breasts are red, tender, or warm.  You have a swollen area on either breast.  You have a fever or chills.  You have nausea or vomiting.  You have drainage other than breast milk from your nipples.  Your breasts do not become full before feedings by the fifth day after you give birth.  You feel sad and depressed.  Your baby is too sleepy to eat well.  Your baby is having trouble sleeping.   Your baby is wetting less than 3 diapers in a 24-hour period.  Your baby has less than 3 stools in a 24-hour period.  Your baby's skin or the white part of his or her eyes becomes yellow.   Your baby is not gaining weight by 67 days of age. SEEK IMMEDIATE MEDICAL CARE IF:   Your baby is overly tired (lethargic) and does not want to wake up and feed.  Your baby develops an unexplained  fever. Document Released: 02/12/2005 Document Revised: 02/17/2013 Document Reviewed: 08/06/2012 [facility] Patient Information 2015 [place], [place]. This information is not intended to replace advice given to you by your health care provider. Make sure you discuss any questions you have with your health care provider.

## 2014-09-04 ENCOUNTER — Telehealth: Payer: Self-pay | Admitting: Obstetrics and Gynecology

## 2014-09-04 ENCOUNTER — Encounter (HOSPITAL_COMMUNITY): Payer: Self-pay | Admitting: *Deleted

## 2014-09-04 ENCOUNTER — Inpatient Hospital Stay (HOSPITAL_COMMUNITY)
Admission: AD | Admit: 2014-09-04 | Discharge: 2014-09-04 | Disposition: A | Discharge status: Home / Self Care | Payer: Medicaid Other | Source: Ambulatory Visit | Attending: Obstetrics & Gynecology | Admitting: Obstetrics & Gynecology

## 2014-09-04 DIAGNOSIS — R1030 Lower abdominal pain, unspecified: Secondary | ICD-10-CM | POA: Diagnosis not present

## 2014-09-04 DIAGNOSIS — F17210 Nicotine dependence, cigarettes, uncomplicated: Secondary | ICD-10-CM | POA: Diagnosis not present

## 2014-09-04 DIAGNOSIS — M549 Dorsalgia, unspecified: Secondary | ICD-10-CM | POA: Diagnosis present

## 2014-09-04 DIAGNOSIS — G8918 Other acute postprocedural pain: Secondary | ICD-10-CM

## 2014-09-04 DIAGNOSIS — O9089 Other complications of the puerperium, not elsewhere classified: Secondary | ICD-10-CM | POA: Insufficient documentation

## 2014-09-04 LAB — URINALYSIS, ROUTINE W REFLEX MICROSCOPIC
Bilirubin Urine: NEGATIVE
Glucose, UA: NEGATIVE mg/dL
Ketones, ur: NEGATIVE mg/dL
Nitrite: NEGATIVE
Protein, ur: NEGATIVE mg/dL
Specific Gravity, Urine: >1.03 — ABNORMAL HIGH (ref 1.005–1.030)
Urobilinogen, UA: 0.2 mg/dL (ref 0.0–1.0)
pH: 5.5 (ref 5.0–8.0)

## 2014-09-04 LAB — URINE MICROSCOPIC-ADD ON

## 2014-09-04 MED ORDER — OXYCODONE-ACETAMINOPHEN 7.5-325 MG PO TABS: 1.0000 | ORAL_TABLET | ORAL | Status: DC | PRN

## 2014-09-04 NOTE — MAU Note (Signed)
Pt presents to MAU with complaints of pain in her cesarean section incision and lower back pain. Pt had a C/S on June the 23rd

## 2014-09-04 NOTE — Discharge Instructions (Signed)
Pain Relief Preoperatively and Postoperatively °Being a good patient does not mean being a silent one. If you have questions, problems, or concerns about the pain you may feel after surgery, let your caregiver know. Patients have the right to assessment and management of pain. The treatment of pain after surgery is important to speed up recovery and return to normal activities. Severe pain after surgery, and the fear or anxiety associated with that pain, may cause extreme discomfort that: °· Prevents sleep. °· Decreases the ability to breathe deeply and cough. This can cause pneumonia or other upper airway infections. °· Causes your heart to beat faster and your blood pressure to be higher. °· Increases the risk for constipation and bloating. °· Decreases the ability of wounds to heal. °· May result in depression, increased anxiety, and feelings of helplessness. °Relief of pain before surgery is also important because it will lessen the pain after surgery. Patients who receive both pain relief before and after surgery experience greater pain relief than those who only receive pain relief after surgery. Let your caregiver know if you are having uncontrolled pain. This is very important. Pain after surgery is more difficult to manage if it is permitted to become severe, so prompt and adequate treatment of acute pain is necessary. °PAIN CONTROL METHODS °Your caregivers follow policies and procedures about the management of patient pain. These guidelines should be explained to you before surgery. Plans for pain control after surgery must be mutually decided upon and instituted with your full understanding and agreement. Do not be afraid to ask questions regarding the care you are receiving. There are many different ways your caregivers will attempt to control your pain, including the following methods. °As needed pain control °· You may be given pain medicine either through your intravenous (IV) tube, or as a pill or  liquid you can swallow. You will need to let your caregiver know when you are having pain. Then, your caregiver will give you the pain medicine ordered for you. °· Your pain medicine may make you constipated. If constipation occurs, drink more liquids if you can. Your caregiver may have you take a mild laxative. °IV patient-controlled analgesia pump (PCA pump) °· You can get your pain medicine through the IV tube which goes into your vein. You are able to control the amount of pain medicine that you get. The pain medicine flows in through an IV tube and is controlled by a pump. This pump gives you a set amount of pain medicine when you push the button hooked up to it. Nobody should push this button but you or someone specifically assigned by you to do so. It is set up to keep you from accidentally giving yourself too much pain medicine. You will be able to start using your pain pump in the recovery room after your surgery. This method can be helpful for most types of surgery. °· If you are still having too much pain, tell your caregiver. Also, tell your caregiver if you are feeling too sleepy or nauseous. °Continuous epidural pain control °· A thin, soft tube (catheter) is put into your back. Pain medicine flows through the catheter to lessen pain in the part of your body where the surgery is done. Continuous epidural pain control may work best for you if you are having surgery on your chest, abdomen, hip area, or legs. The epidural catheter is usually put into your back just before surgery. The catheter is left in until you can eat and take medicine by mouth. In most cases,   this may take 2 to 3 days. °· Giving pain medicine through the epidural catheter may help you heal faster because: °¨ Your bowel gets back to normal faster. °¨ You can get back to eating sooner. °¨ You can be up and walking sooner. °Medicine that numbs the area (local anesthetic) °· You may receive an injection of pain medicine near where the  pain is (local infiltration). °· You may receive an injection of pain medicine near the nerve that controls the sensation to a specific part of the body (peripheral nerve block). °· Medicine may be put in the spine to block pain (spinal block). °Opioids °· Moderate to moderately severe acute pain after surgery may respond to opioids. Opioids are narcotic pain medicine. Opioids are often combined with non-narcotic medicines to improve pain relief, diminish the risk of side effects, and reduce the chance of addiction. °· If you follow your caregiver's directions about taking opioids and you do not have a history of substance abuse, your risk of becoming addicted is exceptionally small. Opioids are given for short periods of time in careful doses to prevent addiction. °Other methods of pain control include: °· Steroids. °· Physical therapy. °· Heat and cold therapy. °· Compression, such as wrapping an elastic bandage around the area of pain. °· Massage. °These various ways of controlling pain may be used together. Combining different methods of pain control is called multimodal analgesia. Using this approach has many benefits, including being able to eat, move around, and leave the hospital sooner. °Document Released: 05/05/2002 Document Revised: 05/07/2011 Document Reviewed: 05/09/2010 °[employer]® Patient Information ©2015 [employer]. This information is not intended to replace advice given to you by your health care provider. Make sure you discuss any questions you have with your health care provider. ° °

## 2014-09-04 NOTE — Telephone Encounter (Signed)
TC from patient--primary C/S with Dr. Charlotta Newtonzan on 6/23 for fetal intolerance to labor remote from delivery, now with increased pain on left side.  Had chills, no fever, no dysuria or constipation, no N/V, no incisional issues. Taking Ibuprophen less frequently now--took at 10a and 5p today.  Ran out of Percocet several days ago. Also reports recurrence of bleeding today.  Directed to come to MAU.  Mia BridgemanVicki Lynton Rubio, CNM 09/04/14 5p

## 2014-09-04 NOTE — MAU Provider Note (Signed)
History     CSN: [id number]  Arrival date and time: 09/04/14 1837   None     Chief Complaint  Patient presents with  . Back Pain  . Incisional Pain   HPI Mia Rubio is 34 y.o. G1P1001 presents for evaluation of C-Section pain X 1 week. Has spoken with Dr. Lawana Chambers  nurse about pain.  Told it sounded " normal" .  Using Percocet (now out ) and tylenol.  Bleeding had stopped but began with moderate flow today.  Woke up from nap this afternoon with sharp left sided incisional and back pain when she stood up at 4:45 today.  Ibuprofen taken at 5pm  not helpful.  Spoke to Avimor, CNM and instructed to come in. She delivered on 6/23.  Neg for redness, tenderness or oozing at incision.  Denies fever, but + for chills/nausea.  She is nursing, neg for breast pain or redness.Marland Kitchen  Her husband returned to work and she is having to "do everything".  Past Medical History  Diagnosis Date  . Low blood pressure   . Tachycardia   . Hypotension     Past Surgical History  Procedure Laterality Date  . Breast surgery    . Appendectomy    . Knee surgery  2001  2002  . Foot surgery  2011 2012  . Plantar fasciitis    . Cesarean section N/A 08/19/2014    Procedure: CESAREAN SECTION;  Surgeon: Myna Hidalgo, DO;  Location: [facility];  Service: Obstetrics;  Laterality: N/A;    Family History  Problem Relation Age of Onset  . Cancer Maternal Grandmother     leg  . Diabetes Maternal Grandmother   . Heart murmur Father     History  Substance Use Topics  . Smoking status: Current Some Day Smoker -- 0.50 packs/day for 15 years    Types: E-cigarettes  . Smokeless tobacco: Never Used     Comment: only when drinks  . Alcohol Use: Yes     Comment: socially    Allergies:  Allergies  Allergen Reactions  . Codeine Itching  . Phenergan [Promethazine Hcl] Itching and Rash    Prescriptions prior to admission  Medication Sig Dispense Refill Last Dose  . ALPRAZolam (XANAX) 0.5 MG tablet Take 0.5 mg by  mouth at bedtime as needed.    Not Taking at Unknown time  . ferrous sulfate 325 (65 FE) MG EC tablet Take 1 tablet (325 mg total) by mouth 2 (two) times daily. 60 tablet 3   . ibuprofen (ADVIL,MOTRIN) 600 MG tablet Take 1 tablet (600 mg total) by mouth every 6 (six) hours as needed for mild pain. 30 tablet 0   . oxyCODONE-acetaminophen (PERCOCET/ROXICET) 5-325 MG per tablet Take 1 tablet by mouth every 4 (four) hours as needed (for pain scale 4-7). 30 tablet 0   . Prenatal Vit-Fe Fumarate-FA (PRENATAL MULTIVITAMIN) TABS tablet Take 1 tablet by mouth daily at 12 noon.   Past Week at Unknown time    Review of Systems  Constitutional: Positive for chills. Negative for fever.  Gastrointestinal: Positive for nausea and abdominal pain. Negative for vomiting.  Genitourinary: Negative for dysuria, urgency and hematuria.       Neg for breast pain, tenderness  Musculoskeletal: Negative for back pain (lower left back pain).  Neurological: Negative for headaches.   Physical Exam   Blood pressure 124/80, pulse 82, temperature 97.8 F (36.6 C), resp. rate 18, unknown if currently breastfeeding.  Physical Exam  Constitutional: She  is oriented to person, place, and time. She appears well-developed and well-nourished. No distress.  HENT:  Head: Normocephalic.  Neck: Normal range of motion.  Respiratory: Right breast exhibits no nipple discharge, no skin change and no tenderness. Left breast exhibits no nipple discharge, no skin change and no tenderness.  GI: Soft. She exhibits no mass. There is tenderness (appropriate for recent surgery.  Incision is healing well.  Neg for active bleeding, open areas and  discharge from inscision.). There is no rebound and no guarding.  Genitourinary:  deferred  Neurological: She is alert and oriented to person, place, and time.  Skin: Skin is warm and dry.  Psychiatric: She has a normal mood and affect. Her behavior is normal.   Results for orders placed or  performed during the hospital encounter of 09/04/14 (from the past 24 hour(s))  Urinalysis, Routine w reflex microscopic (not at [facility])     Status: Abnormal   Collection Time: 09/04/14  6:40 PM  Result Value Ref Range   Color, Urine YELLOW YELLOW   APPearance CLEAR CLEAR   Specific Gravity, Urine >1.030 (H) 1.005 - 1.030   pH 5.5 5.0 - 8.0   Glucose, UA NEGATIVE NEGATIVE mg/dL   Hgb urine dipstick LARGE (A) NEGATIVE   Bilirubin Urine NEGATIVE NEGATIVE   Ketones, ur NEGATIVE NEGATIVE mg/dL   Protein, ur NEGATIVE NEGATIVE mg/dL   Urobilinogen, UA 0.2 0.0 - 1.0 mg/dL   Nitrite NEGATIVE NEGATIVE   Leukocytes, UA TRACE (A) NEGATIVE  Urine microscopic-add on     Status: Abnormal   Collection Time: 09/04/14  6:40 PM  Result Value Ref Range   Squamous Epithelial / LPF FEW (A) RARE   WBC, UA 3-6 <3 WBC/hpf   RBC / HPF 7-10 <3 RBC/hpf   Bacteria, UA FEW (A) RARE   MAU Course  Procedures  MDM MSE Exam Lab Consulted with Dr. Su Hiltoberts who is on call for Dr. Charlotta Newtonzan.  Order given to discharge if UA is Neg and give  Rx Percocet #30.   Follow up with Dr. Charlotta Newtonzan  Assessment and Plan  A:  Lower abdominal pain     Post Op C-Section Day #15  P:  Rx for Percocet       Instructed to get up slowly and remember she had major surgery       Follow up with Dr. Ernestine Conradzan  Costas Sena,EVE M 09/04/2014, 6:54 PM

## 2015-06-16 ENCOUNTER — Encounter (HOSPITAL_BASED_OUTPATIENT_CLINIC_OR_DEPARTMENT_OTHER): Payer: Self-pay | Admitting: *Deleted

## 2015-06-16 ENCOUNTER — Emergency Department (HOSPITAL_BASED_OUTPATIENT_CLINIC_OR_DEPARTMENT_OTHER)
Admission: EM | Admit: 2015-06-16 | Discharge: 2015-06-16 | Disposition: A | Discharge status: Home / Self Care | Payer: Medicaid Other | Attending: Emergency Medicine | Admitting: Emergency Medicine

## 2015-06-16 DIAGNOSIS — F17210 Nicotine dependence, cigarettes, uncomplicated: Secondary | ICD-10-CM | POA: Insufficient documentation

## 2015-06-16 DIAGNOSIS — R251 Tremor, unspecified: Secondary | ICD-10-CM | POA: Insufficient documentation

## 2015-06-16 DIAGNOSIS — I959 Hypotension, unspecified: Secondary | ICD-10-CM | POA: Diagnosis not present

## 2015-06-16 DIAGNOSIS — R0989 Other specified symptoms and signs involving the circulatory and respiratory systems: Secondary | ICD-10-CM

## 2015-06-16 DIAGNOSIS — R55 Syncope and collapse: Secondary | ICD-10-CM

## 2015-06-16 DIAGNOSIS — R61 Generalized hyperhidrosis: Secondary | ICD-10-CM | POA: Insufficient documentation

## 2015-06-16 DIAGNOSIS — R030 Elevated blood-pressure reading, without diagnosis of hypertension: Secondary | ICD-10-CM | POA: Diagnosis not present

## 2015-06-16 DIAGNOSIS — IMO0001 Reserved for inherently not codable concepts without codable children: Secondary | ICD-10-CM

## 2015-06-16 DIAGNOSIS — R002 Palpitations: Secondary | ICD-10-CM

## 2015-06-16 DIAGNOSIS — R202 Paresthesia of skin: Secondary | ICD-10-CM | POA: Insufficient documentation

## 2015-06-16 DIAGNOSIS — G252 Other specified forms of tremor: Secondary | ICD-10-CM

## 2015-06-16 DIAGNOSIS — Z79899 Other long term (current) drug therapy: Secondary | ICD-10-CM | POA: Diagnosis not present

## 2015-06-16 DIAGNOSIS — Z3202 Encounter for pregnancy test, result negative: Secondary | ICD-10-CM | POA: Insufficient documentation

## 2015-06-16 DIAGNOSIS — R259 Unspecified abnormal involuntary movements: Secondary | ICD-10-CM

## 2015-06-16 LAB — BASIC METABOLIC PANEL
Anion gap: 6 (ref 5–15)
BUN: 11 mg/dL (ref 6–20)
CO2: 25 mmol/L (ref 22–32)
Calcium: 9 mg/dL (ref 8.9–10.3)
Chloride: 106 mmol/L (ref 101–111)
Creatinine, Ser: 0.78 mg/dL (ref 0.44–1.00)
GFR calc Af Amer: >60 mL/min (ref >60)
GFR calc non Af Amer: >60 mL/min (ref >60)
GLUCOSE: 107 mg/dL — AB (ref 65–99)
POTASSIUM: 3.4 mmol/L — AB (ref 3.5–5.1)
Sodium: 137 mmol/L (ref 135–145)

## 2015-06-16 LAB — URINALYSIS, ROUTINE W REFLEX MICROSCOPIC
BILIRUBIN URINE: NEGATIVE
GLUCOSE, UA: NEGATIVE mg/dL
HGB URINE DIPSTICK: NEGATIVE
Ketones, ur: NEGATIVE mg/dL
Leukocytes, UA: NEGATIVE
Nitrite: NEGATIVE
Protein, ur: NEGATIVE mg/dL
SPECIFIC GRAVITY, URINE: 1.02 (ref 1.005–1.030)
pH: 7 (ref 5.0–8.0)

## 2015-06-16 LAB — CBC WITH DIFFERENTIAL/PLATELET
BASOS ABS: 0 10*3/uL (ref 0.0–0.1)
Basophils Relative: 0 %
EOS PCT: 1 %
Eosinophils Absolute: 0.1 10*3/uL (ref 0.0–0.7)
HCT: 40.8 % (ref 36.0–46.0)
Hemoglobin: 14 g/dL (ref 12.0–15.0)
LYMPHS ABS: 1.4 10*3/uL (ref 0.7–4.0)
LYMPHS PCT: 26 %
MCH: 30.8 pg (ref 26.0–34.0)
MCHC: 34.3 g/dL (ref 30.0–36.0)
MCV: 89.7 fL (ref 78.0–100.0)
MONO ABS: 0.3 10*3/uL (ref 0.1–1.0)
MONOS PCT: 6 %
Neutro Abs: 3.7 10*3/uL (ref 1.7–7.7)
Neutrophils Relative %: 67 %
PLATELETS: 195 10*3/uL (ref 150–400)
RBC: 4.55 MIL/uL (ref 3.87–5.11)
RDW: 12.6 % (ref 11.5–15.5)
WBC: 5.5 10*3/uL (ref 4.0–10.5)

## 2015-06-16 LAB — PREGNANCY, URINE: Preg Test, Ur: NEGATIVE

## 2015-06-16 NOTE — ED Provider Notes (Signed)
CSN: [id number]     Arrival date & time 06/16/15  1456 History   First MD Initiated Contact with Patient 06/16/15 1558     Chief Complaint  Patient presents with  . Palpitations     (Consider location/radiation/quality/duration/timing/severity/associated sxs/prior Treatment) HPI Mia Rubio Is a 35 year old female who presents emergency Department with chief complaint of heart fluttering and presyncope. Patient states that she has had ongoing problems with heart fluttering and palpitations for several years. She states that she wore a heart monitor. Twice and had a tilt table test, both of which did not produce any significant diagnosis. She states that when she exerts herself or is under severe stress or in very hot temperatures, she has episodes of presyncope, heart fluttering, low blood pressure. She has intermittent bouts of hot and cold sweats. Occasionally she experiences carpals peels spasm, but denies that she is hyperventilating. She denies significant stress in her life. She denies anxiety attacks and does not feel stressed out by the situations. He denies unexplained weight loss, fevers. She states that she is frequently exhausted but she has a 64-month-old child. She is not currently nursing. Patient states that her insurance ran out and she has not been able to follow-up on her symptoms. She states that yesterday she was feeding her child when she had sudden onset of heart palpitations, presyncope. She states that she lied down and felt her heart palpitating and then flutter intermittently for almost 2 hours. She states that she was tremulous, unable to speak, felt as if she is going to pass out every time she tried to sit up, and had oral tingling and carpal spasms. She states she is currently having tremulousness, sweating and carpal spasm along with oral tingling. This has some aching in her right arm. She denies any recent illnesses, fevers, chills, nausea, vomiting, urinary symptoms,  abdominal pain.  Past Medical History  Diagnosis Date  . Low blood pressure   . Tachycardia   . Hypotension    Past Surgical History  Procedure Laterality Date  . Breast surgery    . Appendectomy    . Knee surgery  2001  2002  . Foot surgery  2011 2012  . Plantar fasciitis    . Cesarean section N/A 08/19/2014    Procedure: CESAREAN SECTION;  Surgeon: Myna Hidalgo, DO;  Location: [facility];  Service: Obstetrics;  Laterality: N/A;   Family History  Problem Relation Age of Onset  . Cancer Maternal Grandmother     leg  . Diabetes Maternal Grandmother   . Heart murmur Father    Social History  Substance Use Topics  . Smoking status: Current Some Day Smoker -- 0.50 packs/day for 15 years    Types: E-cigarettes  . Smokeless tobacco: Never Used     Comment: only when drinks  . Alcohol Use: Yes     Comment: socially   OB History    Gravida Para Term Preterm AB TAB SAB Ectopic Multiple Living   0 1     Review of Systems  Ten systems reviewed and are negative for acute change, except as noted in the HPI.    Allergies  Codeine and Phenergan  Home Medications   Prior to Admission medications   Medication Sig Start Date End Date Taking? Authorizing Provider  Zolpidem Tartrate (AMBIEN PO) Take by mouth.   Yes Historical Provider, MD  ferrous sulfate 325 (65 FE) MG EC tablet Take 1 tablet (  325 mg total) by mouth 2 (two) times daily. 08/21/14 08/21/15  Venus Standard, CNM  ibuprofen (ADVIL,MOTRIN) 600 MG tablet Take 1 tablet (600 mg total) by mouth every 6 (six) hours as needed for mild pain. 08/21/14   Venus Standard, CNM  oxyCODONE-acetaminophen (PERCOCET) 7.5-325 MG per tablet Take 1 tablet by mouth every 4 (four) hours as needed. 09/04/14   Elta Guadeloupe, NP  Prenatal Vit-Fe Fumarate-FA (PRENATAL MULTIVITAMIN) TABS tablet Take 1 tablet by mouth daily at 12 noon.    Historical Provider, MD   BP 153/103 mmHg  Pulse 76  Temp(Src) 98.2 F (36.8 C) (Oral)  Resp 20  Ht 5'  9" (1.753 m)  Wt 77.111 kg  BMI 25.09 kg/m2  SpO2 100%  LMP 06/16/2015 Physical Exam  Constitutional: She is oriented to person, place, and time. She appears well-developed and well-nourished. No distress.  HENT:  Head: Normocephalic and atraumatic.  Eyes: Conjunctivae are normal. No scleral icterus.  Neck: Normal range of motion.  Cardiovascular: Normal rate, regular rhythm and normal heart sounds.  Exam reveals no gallop and no friction rub.   No murmur heard. Pulmonary/Chest: Effort normal and breath sounds normal. No respiratory distress.  Abdominal: Soft. Bowel sounds are normal. She exhibits no distension and no mass. There is no tenderness. There is no guarding.  Neurological: She is alert and oriented to person, place, and time.  Tremulous, Speech is clear and goal oriented, follows commands Major Cranial nerves without deficit, no facial droop Normal strength in upper and lower extremities bilaterally including dorsiflexion and plantar flexion, strong and equal grip strength Sensation normal to light and sharp touch Moves extremities without ataxia, coordination intact Normal finger to nose and rapid alternating movements Neg romberg, no pronator drift Normal gait Normal heel-shin and balance   Skin: She is diaphoretic.  Cold and Moist  Psychiatric:  Patient appears quite calm Speaking at a normal pace . She does not appear anxious.  Nursing note and vitals reviewed.   ED Course  Procedures (including critical care time) Labs Review Labs Reviewed  BASIC METABOLIC PANEL - Abnormal; Notable for the following:    Potassium 3.4 (*)    Glucose, Bld 107 (*)    All other components within normal limits  CBC WITH DIFFERENTIAL/PLATELET  TSH  URINALYSIS, ROUTINE W REFLEX MICROSCOPIC (NOT AT [facility])  PREGNANCY, URINE    Imaging Review No results found. I have personally reviewed and evaluated these images and lab results as part of my medical decision-making.   EKG  Interpretation   Date/Time:  Thursday June 16 2015 15:02:59 EDT Ventricular Rate:  68 PR Interval:  134 QRS Duration: 86 QT Interval:  376 QTC Calculation: 399 R Axis:   90 Text Interpretation:  Normal sinus rhythm with sinus arrhythmia Rightward  axis Borderline ECG Confirmed by ZACKOWSKI  MD, SCOTT ([id number]) on 06/16/2015  4:26:03 PM      MDM   Final diagnoses:  Pre-syncope  Heart palpitations  Sweating  Paresthesia of both hands  Resting tremor  Labile blood pressure    Patient work up with out abnormality. TSH wnl. ? Dysautonomia (baroreceptor failure) gven the constellation of her sxs. Patient has had previous work ups. I advise the patient to follow Up within the next 1-2 days with her primary care physician. Have given the patient a referral to a cardiologist at [facility], who also specializes in postural orthostatic tachycardia syndrome. This may be a good referral for this patient given her symptoms, which seems  similar to a dysautonomia. Patient appears safe for discharge at this time. I also advised the patient that if she has symptoms of heart racing and presyncope. She should call 911 in order to have EKG, place on her as quickly as possible. Patient understands instructions. She appears safe for discharge at this time    Arthor Captainbigail Nemesio Castrillon, PA-C 06/18/15 2233  Vanetta MuldersScott Zackowski, MD 06/19/15 [phone]

## 2015-06-16 NOTE — ED Notes (Signed)
Heart fluttering x 1 month. States her hands and mouth feel tingly. Near syncope yesterday.

## 2015-06-16 NOTE — Discharge Instructions (Signed)
If you develop the symptoms again. Call 911 and have the paramedics come. You need an immediate EKG to look at your heart rhythm.  You do not necessarily have to come to the ER if it is not necessary.   Palpitations A palpitation is the feeling that your heartbeat is irregular or is faster than normal. It may feel like your heart is fluttering or skipping a beat. Palpitations are usually not a serious problem. However, in some cases, you may need further medical evaluation. CAUSES  Palpitations can be caused by:  Smoking.  Caffeine or other stimulants, such as diet pills or energy drinks.  Alcohol.  Stress and anxiety.  Strenuous physical activity.  Fatigue.  Certain medicines.  Heart disease, especially if you have a history of irregular heart rhythms (arrhythmias), such as atrial fibrillation, atrial flutter, or supraventricular tachycardia.  An improperly working pacemaker or defibrillator. DIAGNOSIS  To find the cause of your palpitations, your health care provider will take your medical history and perform a physical exam. Your health care provider may also have you take a test called an ambulatory electrocardiogram (ECG). An ECG records your heartbeat patterns over a 24-hour period. You may also have other tests, such as:  Transthoracic echocardiogram (TTE). During echocardiography, sound waves are used to evaluate how blood flows through your heart.  Transesophageal echocardiogram (TEE).  Cardiac monitoring. This allows your health care provider to monitor your heart rate and rhythm in real time.  Holter monitor. This is a portable device that records your heartbeat and can help diagnose heart arrhythmias. It allows your health care provider to track your heart activity for several days, if needed.  Stress tests by exercise or by giving medicine that makes the heart beat faster. TREATMENT  Treatment of palpitations depends on the cause of your symptoms and can vary  greatly. Most cases of palpitations do not require any treatment other than time, relaxation, and monitoring your symptoms. Other causes, such as atrial fibrillation, atrial flutter, or supraventricular tachycardia, usually require further treatment. HOME CARE INSTRUCTIONS   Avoid:  Caffeinated coffee, tea, soft drinks, diet pills, and energy drinks.  Chocolate.  Alcohol.  Stop smoking if you smoke.  Reduce your stress and anxiety. Things that can help you relax include:  A method of controlling things in your body, such as your heartbeats, with your mind (biofeedback).  Yoga.  Meditation.  Physical activity such as swimming, jogging, or walking.  Get plenty of rest and sleep. SEEK MEDICAL CARE IF:   You continue to have a fast or irregular heartbeat beyond 24 hours.  Your palpitations occur more often. SEEK IMMEDIATE MEDICAL CARE IF:  You have chest pain or shortness of breath.  You have a severe headache.  You feel dizzy or you faint. MAKE SURE YOU:  Understand these instructions.  Will watch your condition.  Will get help right away if you are not doing well or get worse.   This information is not intended to replace advice given to you by your health care provider. Make sure you discuss any questions you have with your health care provider.   Document Released: 02/10/2000 Document Revised: 02/17/2013 Document Reviewed: 04/13/2011 Elsevier Interactive Patient Education 2016 [employer]  Paresthesia Paresthesia is an abnormal burning or prickling sensation. This sensation is generally felt in the hands, arms, legs, or feet. However, it may occur in any part of the body. Usually, it is not painful. The feeling may be described as:  Tingling or numbness.  Pins and needles.  Skin crawling.  Buzzing.  Limbs falling asleep.  Itching. Most people experience temporary (transient) paresthesia at some time in their lives. Paresthesia may occur when you  breathe too quickly (hyperventilation). It can also occur without any apparent cause. Commonly, paresthesia occurs when pressure is placed on a nerve. The sensation quickly goes away after the pressure is removed. For some people, however, paresthesia is a long-lasting (chronic) condition that is caused by an underlying disorder. If you continue to have paresthesia, you may need further medical evaluation. HOME CARE INSTRUCTIONS Watch your condition for any changes. Taking the following actions may help to lessen any discomfort that you are feeling:  Avoid drinking alcohol.  Try acupuncture or massage to help relieve your symptoms.  Keep all follow-up visits as directed by your health care provider. This is important. SEEK MEDICAL CARE IF:  You continue to have episodes of paresthesia.  Your burning or prickling feeling gets worse when you walk.  You have pain, cramps, or dizziness.  You develop a rash. SEEK IMMEDIATE MEDICAL CARE IF:  You feel weak.  You have trouble walking or moving.  You have problems with speech, understanding, or vision.  You feel confused.  You cannot control your bladder or bowel movements.  You have numbness after an injury.  You faint.   This information is not intended to replace advice given to you by your health care provider. Make sure you discuss any questions you have with your health care provider.   Document Released: 02/02/2002 Document Revised: 06/29/2014 Document Reviewed: 02/08/2014 Elsevier Interactive Patient Education 2016 [employer].  Near-Syncope Near-syncope (commonly known as near fainting) is sudden weakness, dizziness, or feeling like you might pass out. During an episode of near-syncope, you may also develop pale skin, have tunnel vision, or feel sick to your stomach (nauseous). Near-syncope may occur when getting up after sitting or while standing for a long time. It is caused by a sudden decrease in blood flow to the brain.  This decrease can result from various causes or triggers, most of which are not serious. However, because near-syncope can sometimes be a sign of something serious, a medical evaluation is required. The specific cause is often not determined. HOME CARE INSTRUCTIONS  Monitor your condition for any changes. The following actions may help to alleviate any discomfort you are experiencing:  Have someone stay with you until you feel stable.  Lie down right away and prop your feet up if you start feeling like you might faint. Breathe deeply and steadily. Wait until all the symptoms have passed. Most of these episodes last only a few minutes. You may feel tired for several hours.   Drink enough fluids to keep your urine clear or pale yellow.   If you are taking blood pressure or heart medicine, get up slowly when seated or lying down. Take several minutes to sit and then stand. This can reduce dizziness.  Follow up with your health care provider as directed. SEEK IMMEDIATE MEDICAL CARE IF:   You have a severe headache.   You have unusual pain in the chest, abdomen, or back.   You are bleeding from the mouth or rectum, or you have black or tarry stool.   You have an irregular or very fast heartbeat.   You have repeated fainting or have seizure-like jerking during an episode.   You faint when sitting or lying down.   You have confusion.   You have difficulty walking.  You have severe weakness.   You have vision problems.  MAKE SURE YOU:   Understand these instructions.  Will watch your condition.  Will get help right away if you are not doing well or get worse.   This information is not intended to replace advice given to you by your health care provider. Make sure you discuss any questions you have with your health care provider.   Document Released: 02/12/2005 Document Revised: 02/17/2013 Document Reviewed: 07/18/2012 Elsevier Interactive Patient Education [employer].

## 2015-06-17 LAB — TSH: TSH: 1.239 u[IU]/mL (ref 0.350–4.500)

## 2015-06-20 DIAGNOSIS — T84498A Other mechanical complication of other internal orthopedic devices, implants and grafts, initial encounter: Secondary | ICD-10-CM | POA: Diagnosis not present

## 2015-06-20 DIAGNOSIS — M722 Plantar fascial fibromatosis: Secondary | ICD-10-CM | POA: Diagnosis not present

## 2015-06-20 DIAGNOSIS — L600 Ingrowing nail: Secondary | ICD-10-CM | POA: Diagnosis not present

## 2015-07-14 ENCOUNTER — Encounter: Payer: Self-pay | Admitting: Internal Medicine

## 2015-07-14 ENCOUNTER — Ambulatory Visit (INDEPENDENT_AMBULATORY_CARE_PROVIDER_SITE_OTHER): Payer: Medicaid Other | Admitting: Internal Medicine

## 2015-07-14 VITALS — BP 130/80 | HR 83 | Ht 69.0 in | Wt 169.2 lb

## 2015-07-14 DIAGNOSIS — R000 Tachycardia, unspecified: Secondary | ICD-10-CM | POA: Diagnosis not present

## 2015-07-14 DIAGNOSIS — I498 Other specified cardiac arrhythmias: Secondary | ICD-10-CM

## 2015-07-14 DIAGNOSIS — I951 Orthostatic hypotension: Secondary | ICD-10-CM | POA: Diagnosis not present

## 2015-07-14 DIAGNOSIS — G90A Postural orthostatic tachycardia syndrome (POTS): Secondary | ICD-10-CM

## 2015-07-14 NOTE — Progress Notes (Signed)
ELECTROPHYSIOLOGY CONSULT NOTE  Patient ID: Mia Rubio, MRN: [id number], DOB/AGE: Oct 27, 1980 35 y.o. Admit date: (Not on file) Date of Consult: 07/14/2015  Primary Physician: Mia Saupe, MD Primary Cardiologist: new  Consulting Physician Mia Rubio  Chief Complaint: ?POTS   HPI Mia Rubio is a 35 y.o. female referred because of spells. Her history goes back about to 2010 They were characterized by a fluttery sensation. There was some lightheadedness. They were described as panic attacks. She described a rushing sensation and her "brain being slushy". She found that if she would lie down symptoms would attenuate. There were frequently provoked by heat or caffeine.  She wore monitors a couple of times in the initial evaluation; they were unrevealing. A tilt table test was also done that was normal.  The only objective finding that she recalls was orthostatic hypotension occurring variably. She was told to eat salt or drink alcohol. She would eat salt by taking a handful assault with one of her spells and felt that it would attenuate the spell over about 30 minutes. Alcohol would do the same. These attacks largely abated during the pregnancy that ended up with the delivery of her son about 6 months ago.  In April she had a presyncopal episode. This was different from other spells. With this spell she had record her blood pressure home as being elevated. I should note that in the past she has recorded her blood pressures in the 90s.  All of these spells are associated with his fluttery sensation that persists throughout the duration of the episode. She [place] Sp[place] out of the wall as irregularly irregular. It has never been recorded. She also has paresthesias of lips and hands with the spells. She finds it difficult to speak. She can lose vision without losing postural tone. There is accompanying shortness of breath. The recovery phase is notable for profound fatigue and myalgias headaches and  more recently hot and cold sensations with diaphoresis.  Her first spells occurred while she was lying down. Dizziness occurs is prominently when lying down as well as sitting up. She has had prominent headaches with this. There was a neurological evaluation 7 years ago. This was unenlightening. Multiple records are reviewed. Laboratories are notable for mild hypokalemia repeated testing.    Her diet is deplete of sodium and is deplete of fluid  Heat intolerance in the past but not now Shower  tolerance  Orthostatic tolerance   Past Medical History  Diagnosis Date  . Low blood pressure   . Tachycardia   . Hypotension       Surgical History:  Past Surgical History  Procedure Laterality Date  . Breast surgery    . Appendectomy    . Knee surgery  2001  2002  . Foot surgery  2011 2012  . Plantar fasciitis    . Cesarean section N/A 08/19/2014    Procedure: CESAREAN SECTION;  Surgeon: Mia Hidalgo, DO;  Location: [facility];  Service: Obstetrics;  Laterality: N/A;     Home Meds: Prior to Admission medications   Medication Sig Start Date End Date Taking? Authorizing Provider  ferrous sulfate 325 (65 FE) MG EC tablet Take 1 tablet (325 mg total) by mouth 2 (two) times daily. 08/21/14 08/21/15  Mia Rubio, CNM  ibuprofen (ADVIL,MOTRIN) 600 MG tablet Take 1 tablet (600 mg total) by mouth every 6 (six) hours as needed for mild pain. 08/21/14   Mia Rubio, CNM  oxyCODONE-acetaminophen (PERCOCET) 7.5-325 MG per tablet  Take 1 tablet by mouth every 4 (four) hours as needed. 09/04/14   Mia GuadeloupeEvelyn M Key, NP  Prenatal Vit-Fe Fumarate-FA (PRENATAL MULTIVITAMIN) TABS tablet Take 1 tablet by mouth daily at 12 noon.    Historical Provider, MD  Zolpidem Tartrate (AMBIEN PO) Take by mouth.    Historical Provider, MD    Allergies:  Allergies  Allergen Reactions  . Caffeine Palpitations  . Codeine Itching  . Phenergan [Promethazine Hcl] Itching and Rash    Social History   Social History  . Marital  Status: Married    Spouse Name: N/A  . Number of Children: N/A  . Years of Education: N/A   Occupational History  . Not on file.   Social History Main Topics  . Smoking status: Current Some Day Smoker -- 0.50 packs/day for 15 years    Types: E-cigarettes  . Smokeless tobacco: Never Used     Comment: only when drinks  . Alcohol Use: Yes     Comment: socially  . Drug Use: No  . Sexual Activity: Not on file   Other Topics Concern  . Not on file   Social History Narrative     Family History  Problem Relation Age of Onset  . Cancer Maternal Grandmother     leg  . Diabetes Maternal Grandmother   . Heart murmur Father      ROS:  Please see the history of present illness.     All other systems reviewed and negative.    Physical Exam: Blood pressure 130/80, pulse 83, height 5\' 9"  (1.753 m), weight 169 lb 3.2 oz (76.749 kg), last menstrual period 06/16/2015, SpO2 100 %, unknown if currently breastfeeding. General: Well developed, well nourished female in no acute distress. Head: Normocephalic, atraumatic, sclera non-icteric, no xanthomas, nares are without discharge. EENT: normal  Lymph Nodes:  none Neck: Negative for carotid bruits. JVD not elevated. Back:without scoliosis kyphosis  Lungs: Clear bilaterally to auscultation without wheezes, rales, or rhonchi. Breathing is unlabored. Heart: RRR with S1 S2. No  murmur . No rubs, or gallops appreciated. Abdomen: Soft, non-tender, non-distended with normoactive bowel sounds. No hepatomegaly. No rebound/guarding. No obvious abdominal masses. Msk:  Strength and tone appear normal for age. Extremities: No clubbing or cyanosis. No edema.  Distal pedal pulses are 2+ and equal bilaterally. Skin: Warm and Dry Neuro: Alert and oriented X 3. CN III-XII intact Grossly normal sensory and motor function . Psych:  Responds to questions appropriately with a normal affect.      Labs: Cardiac Enzymes No results for input(s): CKTOTAL, CKMB,  TROPONINI in the last 72 hours. CBC Lab Results  Component Value Date   WBC 5.5 06/16/2015   HGB 14.0 06/16/2015   HCT 40.8 06/16/2015   MCV 89.7 06/16/2015   PLT 195 06/16/2015   PROTIME: No results for input(s): LABPROT, INR in the last 72 hours. Chemistry No results for input(s): NA, K, CL, CO2, BUN, CREATININE, CALCIUM, PROT, BILITOT, ALKPHOS, ALT, AST, GLUCOSE in the last 168 hours.  Invalid input(s): LABALBU Lipids No results found for: CHOL, HDL, LDLCALC, TRIG BNP No results found for: PROBNP Thyroid Function Tests: No results for input(s): TSH, T4TOTAL, T3FREE, THYROIDAB in the last 72 hours.  Invalid input(s): FREET3 Miscellaneous Lab Results  Component Value Date   DDIMER  05/01/2008    0.31        AT THE INHOUSE ESTABLISHED CUTOFF VALUE OF 0.48 ug/mL FEU, THIS ASSAY HAS BEEN DOCUMENTED IN THE LITERATURE TO HAVE A  SENSITIVITY AND NEGATIVE PREDICTIVE VALUE OF AT LEAST 98 TO 99%.  THE TEST RESULT SHOULD BE CORRELATED WITH AN ASSESSMENT OF THE CLINICAL PROBABILITY OF DVT / VTE.    Radiology/Studies:  No results found.  EKG: Reviewed from 4/17 demonstrating normal sinus rhythm at 68 Intervals/09/38 Otherwise normal   Assessment and Plan:   Spells--disturbed consciousness  Palpitations   I do not understand this lady spells. The dizziness in her history and the dizziness noted today while flat with normal vital signs strongly suggest to me that this is noncardiac in origin. All part of her past history is consistent with autonomic dysfunction with heat intolerance and some positional nature of this, the story as to many other components that make it hard for me to tie it into an autonomic syndrome. I related this to the family. She does not have POTS by definition. It is not clear whether she has ever been hypotensive. Certainly she has not been hypotensive of late with documentation here and in the emergency room following her syncopal/near syncopal  episode demonstrating orthostatic hypertension  I have raised the possibility of a neuropsychiatric component to all of this. I suggested that she follow-up with her primary care physician regarding referral to neurology. I've also suggested that if there is a likely emotional psychiatric component that she should pursue that. She asked that this would include counseling I said yes.  I did not further explore this issue.  As these episodes are universally associated with a flutter which is irregularly irregular, possibility exists that is this is a primary arrhythmia disorder with significant associated symptoms including what sounds like hyperventilation and so I suggested the use of the AliveCor monitor to capture her heart rhythm during these spells. She says that the severe fluttering last 3 hours so AliveCor should work just fine.     Sherryl Manges

## 2015-07-14 NOTE — Patient Instructions (Signed)
Medication Instructions: - no changes  Labwork: - none  Procedures/Testing: - none  Follow-Up: - Your physician recommends that you schedule a follow-up appointment in: 3 months with Dr. Graciela HusbandsKlein  Any Additional Special Instructions Will Be Listed Below (If Applicable). - [email or web address] - Alive Cor monitor app   If you need a refill on your cardiac medications before your next appointment, please call your pharmacy.

## 2015-08-09 ENCOUNTER — Telehealth: Payer: Self-pay | Admitting: Internal Medicine

## 2015-08-09 NOTE — Telephone Encounter (Signed)
Pt c/o Syncope: STAT if syncope occurred within 30 minutes and pt complains of lightheadedness High Priority if episode of passing out, completely, today or in last 24 hours   1. Did you pass out today? no  When is the last time you passed out? Last night- eyes went black, did not lose conscienceness but was holding to something where she was able to breathe and compose herself.   2. Has this occurred multiple times? yes  3. Did you have any symptoms prior to passing out? Heart palp, fatigue, "brain fog"   Has appt sched for 6/21 w/ Dr Graciela HusbandsKlein- wanted to discus sw/ RN. Please call back and discuss.

## 2015-08-09 NOTE — Telephone Encounter (Signed)
I spoke with the patient.  She reports an episode of feeling like she was blacking out yesterday. She was standing up from painting and felt like she would pass out, but she did not lose consciousness. She states this last anywhere from 10-30 seconds.  She had another episode that was this severe just prior to seeing Dr. Graciela HusbandsKlein- this was related to picking up her son. She has episodes of tachycardia/ palpitations that are related to these episodes, but some that are not.  She is having trouble exercising as well.  I inquired if she is drinking enough, she states that she probably is not. Salt tabs just came in the mail today- she will start tonight. Advised use of an abdominal binder.  She inquired if she could have a stress test for us to monitor her during exercise. Reviewed with Dr. Graciela HusbandsKlein- she is already scheduled to f/u on 6/21 with Dr. Graciela HusbandsKlein. Per Dr. Graciela HusbandsKlein- use abdominal binder and f/u as scheduled. The patient is aware and agreeable.

## 2015-08-17 ENCOUNTER — Ambulatory Visit (INDEPENDENT_AMBULATORY_CARE_PROVIDER_SITE_OTHER): Payer: Medicaid Other | Admitting: Internal Medicine

## 2015-08-17 ENCOUNTER — Encounter: Payer: Self-pay | Admitting: Internal Medicine

## 2015-08-17 VITALS — BP 131/89 | HR 95 | Ht 69.0 in | Wt 169.2 lb

## 2015-08-17 DIAGNOSIS — R002 Palpitations: Secondary | ICD-10-CM | POA: Diagnosis not present

## 2015-08-17 DIAGNOSIS — R55 Syncope and collapse: Secondary | ICD-10-CM | POA: Diagnosis not present

## 2015-08-17 DIAGNOSIS — R42 Dizziness and giddiness: Secondary | ICD-10-CM

## 2015-08-17 NOTE — Patient Instructions (Signed)
Medication Instructions: - Your physician recommends that you continue on your current medications as directed. Please refer to the Current Medication list given to you today.  Labwork: - none  Procedures/Testing: - none  Follow-Up: - Your physician recommends that you keep your follow-up appointment as scheduled with Dr. Graciela HusbandsKlein.:  Any Additional Special Instructions Will Be Listed Below (If Applicable).     If you need a refill on your cardiac medications before your next appointment, please call your pharmacy.

## 2015-08-17 NOTE — Progress Notes (Signed)
      Patient Care Team: Cain Saupeammie Fulp, MD as PCP - General (Family Medicine) Cain Saupeammie Fulp, MD as Referring Physician (Family Medicine)   HPI  Mia Rubio is a 35 y.o. female Seen in follow-up for spells which when I saw her last month I noted that I did not understand. She has had recurrent syncope.  Her dizziness in the past has been recumbent associated with normal vital signs which to me suggested a noncardiac perhaps neuropsychiatric origin. It was recommended that she be referred for consideration of these components. 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 She is increasing fluid intake but her urine is still yellow. She is increased her salt intake. Blood pressures were mildly elevated in the 1:30 range  There is a recorded blood pressure 98/70 in the chart from 2 years ago    Past Medical History  Diagnosis Date  . Low blood pressure   . Tachycardia   . Hypotension     Past Surgical History  Procedure Laterality Date  . Breast surgery    . Appendectomy    . Knee surgery  2001  2002  . Foot surgery  2011 2012  . Plantar fasciitis    . Cesarean section N/A 08/19/2014    Procedure: CESAREAN SECTION;  Surgeon: Myna HidalgoJennifer Ozan, DO;  Location: [facility];  Service: Obstetrics;  Laterality: N/A;    Current Outpatient Prescriptions  Medication Sig Dispense Refill  . ALPRAZolam (XANAX) 0.25 MG tablet Take 1 tablet by mouth 2 (two) times daily as needed. sleep  1  . ferrous sulfate 325 (65 FE) MG EC tablet Take 1 tablet (325 mg total) by mouth 2 (two) times daily. 60 tablet 3  . glucosamine-chondroitin 500-400 MG tablet Take 1 tablet by mouth daily. For joint lubrication     . SODIUM CHLORIDE PO Take 2 tablets by mouth daily.    Marland Kitchen. zolpidem (AMBIEN) 10 MG tablet Take 10 mg by mouth at bedtime as needed. insomnia  5   No current facility-administered medications for this visit.    Allergies  Allergen Reactions  . Caffeine Palpitations  . Codeine Itching  . Phenergan [Promethazine Hcl] Itching and Rash      Review of Systems negative except from HPI and PMH  Physical Exam BP 131/89 mmHg  Pulse 95  Ht 5\' 9"  (1.753 m)  Wt 169 lb 3.2 oz (76.749 kg)  BMI 24.98 kg/m2  SpO2 99%  LMP 08/10/2015 (Approximate) Well developed and well nourished in no acute distress HENT normal E scleral and icterus clear Neck Supple JVP flat; carotids brisk and full Clear to ausculation  Regular rate and rhythm, no murmurs gallops or rub Soft with active bowel sounds No clubbing cyanosis  Edema Alert and oriented, grossly normal motor and sensory function Skin Warm and Dry    Assessment and  Plan  Syncope  Orthostatic and heat intolerance   Her symptoms increasingly seem to be consistent with autonomic insufficiency. Her response to the abdominal binder is very encouraging. I think dragging his.A6 and 60 over

## 2015-10-21 ENCOUNTER — Ambulatory Visit: Payer: Medicaid Other | Admitting: Internal Medicine

## 2015-10-24 IMAGING — US US ABDOMEN LIMITED
1 series · 14 of 25 positions shown · non-contrast
Comparison: None.

CLINICAL DATA: Abdominal tenderness particularly within the right
upper quadrant

EXAM:
US ABDOMEN LIMITED - RIGHT UPPER QUADRANT

[Series 1: us abdomen limited · 0.26mm/px · 14 of 51 slices shown]
[im 1/51]
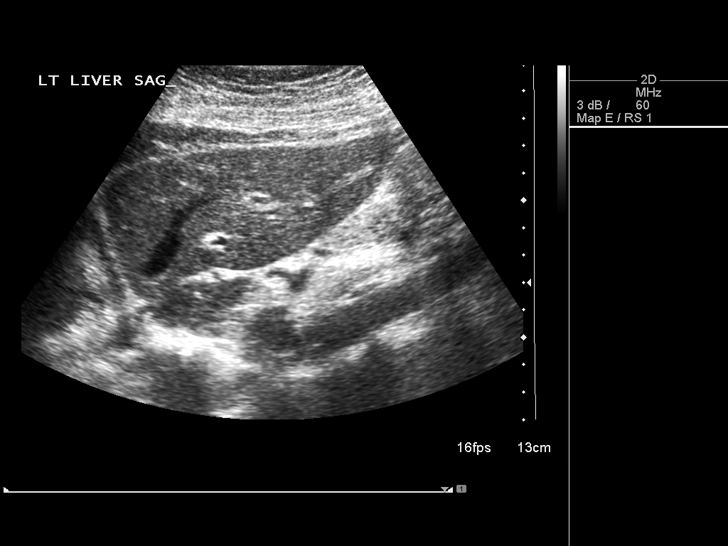
[im 5/51]
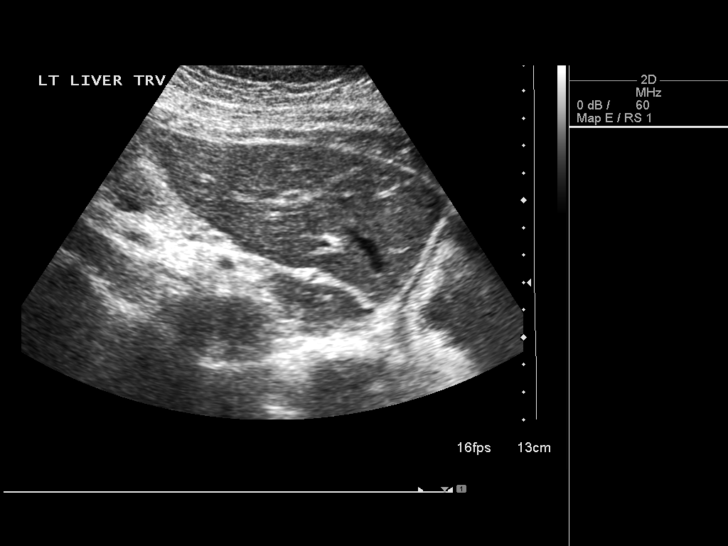
[im 9/51]
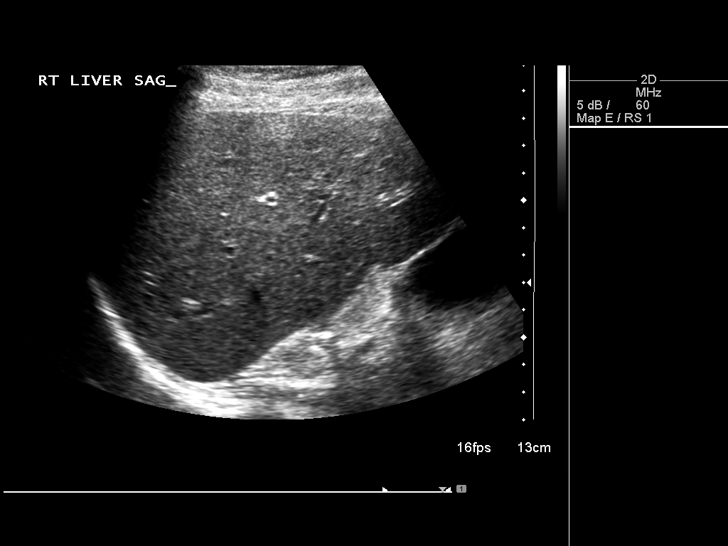
[im 13/51]
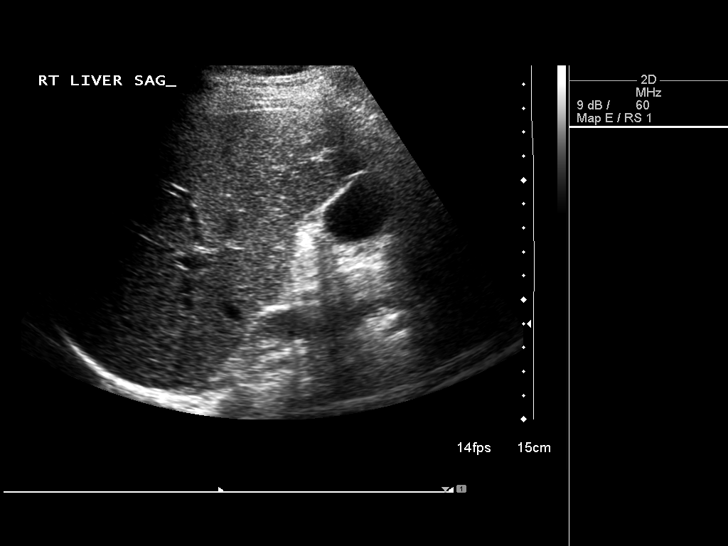
[im 17/51]
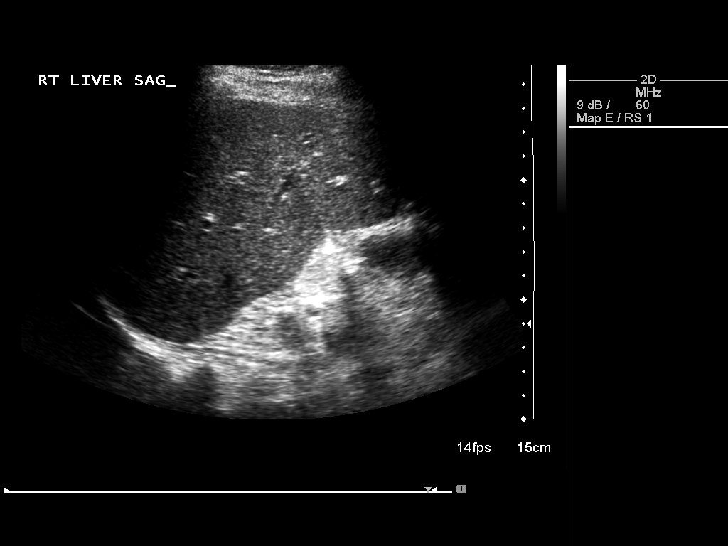
[im 19/51]
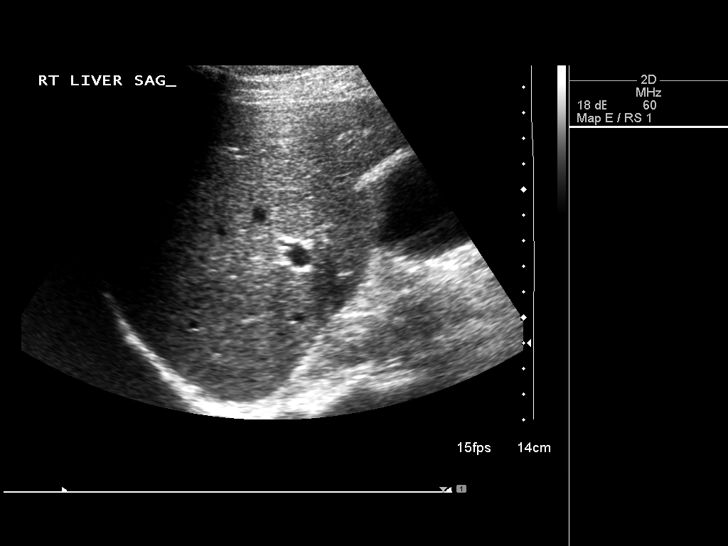
[im 23/51]
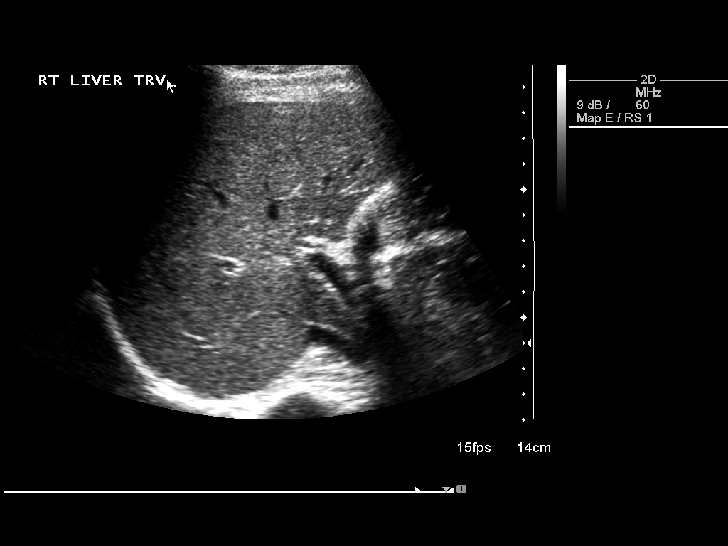
[im 28/51]
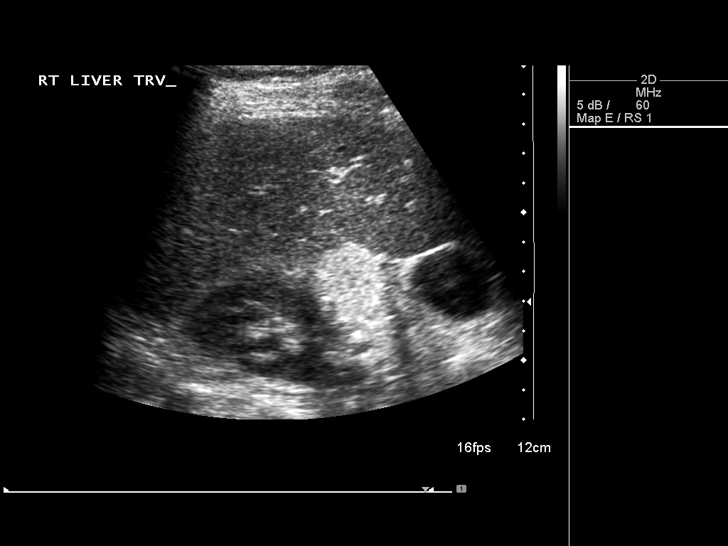
[im 32/51]
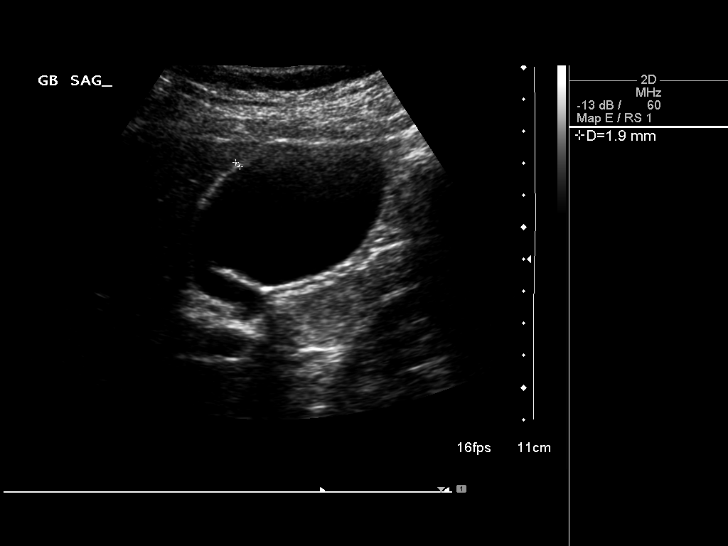
[im 34/51]
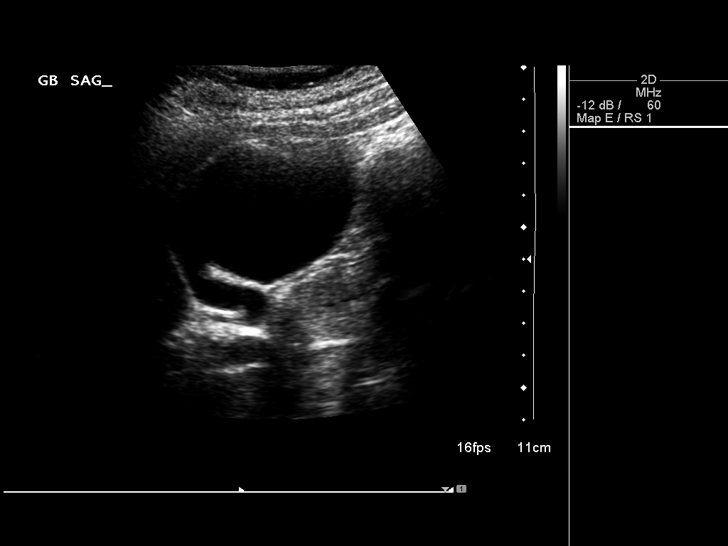
[im 38/51]
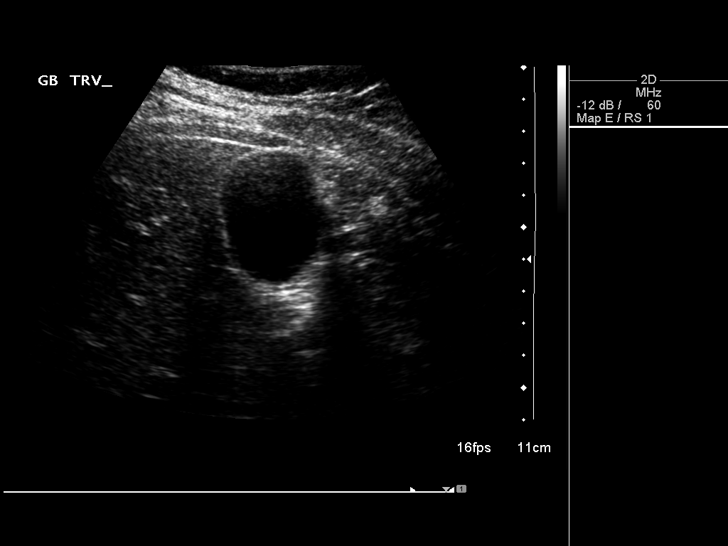
[im 42/51]
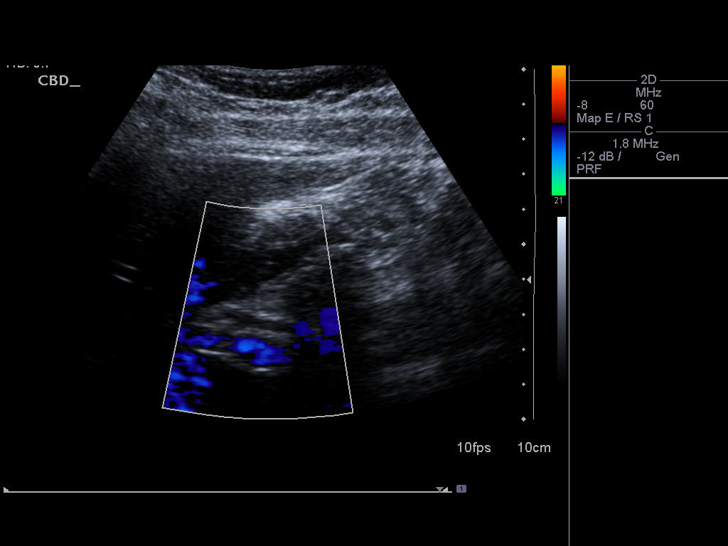
[im 46/51]
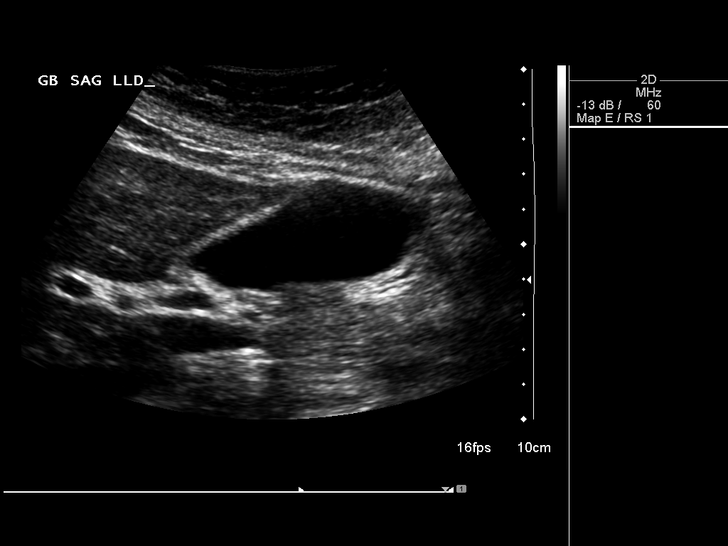
[im 51/51]
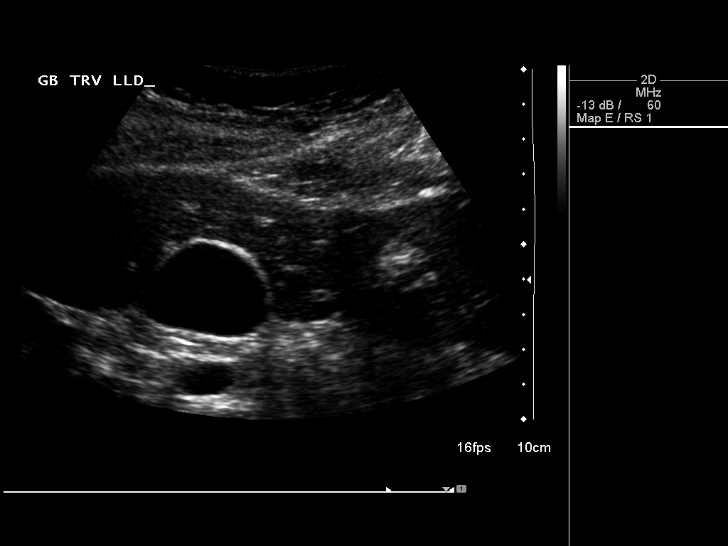

[14 of 25 positions shown; findings below may reference images not displayed]

FINDINGS: Gallbladder:

The gallbladder is visualized and no gallstones are noted. There is
no pain over the gallbladder with compression.

Common bile duct:

Diameter: The common bile duct is normal measuring 3.3 mm in
diameter.

Liver:

The liver has a normal echogenic pattern. No focal abnormality is
seen.
IMPRESSION: Negative limited ultrasound of the right upper quadrant

## 2015-11-05 ENCOUNTER — Telehealth: Payer: Self-pay | Admitting: Obstetrics and Gynecology

## 2015-11-05 NOTE — Telephone Encounter (Signed)
Telephone call from patient with reports of DUB while on continuous BCP. Questioning if she may be pregnant and having a miscarriage. Bleeding is moderate. Advised to do a home UPT, if positive should be evaluated. If negative, patient was given bleeding precautions and instructions to follow-up with her physician on Monday to review plan of care. Patient voiced understanding and is agreeable.

## 2016-03-08 ENCOUNTER — Ambulatory Visit (INDEPENDENT_AMBULATORY_CARE_PROVIDER_SITE_OTHER): Payer: Medicaid Other | Admitting: Internal Medicine

## 2016-03-08 ENCOUNTER — Encounter: Payer: Self-pay | Admitting: Internal Medicine

## 2016-03-08 VITALS — BP 150/95 | HR 90 | Ht 69.0 in | Wt 162.8 lb

## 2016-03-08 DIAGNOSIS — G909 Disorder of the autonomic nervous system, unspecified: Secondary | ICD-10-CM | POA: Diagnosis not present

## 2016-03-08 DIAGNOSIS — R55 Syncope and collapse: Secondary | ICD-10-CM | POA: Diagnosis not present

## 2016-03-08 DIAGNOSIS — R002 Palpitations: Secondary | ICD-10-CM | POA: Diagnosis not present

## 2016-03-08 DIAGNOSIS — R42 Dizziness and giddiness: Secondary | ICD-10-CM | POA: Diagnosis not present

## 2016-03-08 NOTE — Patient Instructions (Addendum)

## 2016-03-08 NOTE — Progress Notes (Signed)
Patient Care Team: Cain Saupe, MD as PCP - General (Family Medicine) Cain Saupe, MD as Referring Physician (Family Medicine)   HPI  Mia Rubio is a 36 y.o. female Seen in follow-up for spells which when I saw her last month I noted that I did not understand. She has had recurrent syncope.  Her dizziness in the past has been recumbent associated with normal vital signs which to me suggested a noncardiac perhaps neuropsychiatric origin. It was recommended that she be referred for consideration of these components. There also was a component of heat and orthostatic intolerance.  HM had suggested an abdominal binder which had helped considerably    She has done much better with the cooler temperatures. She has significant exertional fatigue. She has postprandial lightheadedness. She comes in today with elevated blood pressures. She has been copious with her salt intake but denies other stimulants.  Compressive clothing has been a challenge because she gets hot and lightheaded.  She's tried recumbent exercise that this is been poorly tolerated.  She also struggles with insomnia which has been an increasing problem for which she is currently taking benzodiazepines  There is a recorded blood pressure 98/70 in the chart from 2 years ago    Past Medical History:  Diagnosis Date  . Hypotension   . Low blood pressure   . Tachycardia     Past Surgical History:  Procedure Laterality Date  . APPENDECTOMY    . BREAST SURGERY    . CESAREAN SECTION N/A 08/19/2014   Procedure: CESAREAN SECTION;  Surgeon: Myna Hidalgo, DO;  Location: [facility];  Service: Obstetrics;  Laterality: N/A;  . FOOT SURGERY  2011 2012  . KNEE SURGERY  2001  2002  . plantar fasciitis      Current Outpatient Prescriptions  Medication Sig Dispense Refill  . ALPRAZolam (XANAX) 0.25 MG tablet Take 1 tablet by mouth 2 (two) times daily as needed. sleep  1  . glucosamine-chondroitin 500-400 MG tablet Take 1  tablet by mouth daily. For joint lubrication    . SODIUM CHLORIDE PO Take 2 tablets by mouth daily.    Marland Kitchen zolpidem (AMBIEN) 10 MG tablet Take 10 mg by mouth at bedtime as needed. insomnia  5   No current facility-administered medications for this visit.     Allergies  Allergen Reactions  . Caffeine Palpitations  . Codeine Itching  . Phenergan [Promethazine Hcl] Itching and Rash      Review of Systems negative except from HPI and PMH  Physical Exam BP (!) 150/95   Pulse 90   Ht 5\' 9"  (1.753 m)   Wt 162 lb 12.8 oz (73.8 kg)   SpO2 99%   BMI 24.04 kg/m  Well developed and well nourished in no acute distress HENT normal E scleral and icterus clear Neck Supple JVP flat; carotids brisk and full Clear to ausculation  Regular rate and rhythm, no murmurs gallops or rub Soft with active bowel sounds No clubbing cyanosis  Edema Alert and oriented, grossly normal motor and sensory function Skin Warm and Dry    Assessment and  Plan  Syncope  Orthostatic and heat intolerance   Sleep intolerance  Elevated blood pressure   Her symptoms continue to be some consistent with autonomic dysfunction. The elevated blood pressures longer concerning, I think or hope rather that it is related to her salt intake. We will decrease it. There has been a group of patients who have postural tachycardia and  elevated blood pressures; she may benefit from a beta blocker.  We have also discussed the importance of exercise. She will again make efforts on her recumbent fight.  We've talked again about compressive clothing and try to figure out some way to mitigate the heat issues that are problematic for her wearing it. She notes that she is hyper hidrotic  More than 50% of 45 min was spent in counseling related to the above  Also discussed counseling to deal with chronic illness  Gave info for restorration place

## 2016-03-12 ENCOUNTER — Telehealth: Payer: Self-pay | Admitting: Internal Medicine

## 2016-03-12 NOTE — Telephone Encounter (Signed)
Returned call to patient.She stated she saw PCP today her B/P 120/76.Stated B/P was high when she saw Dr.Klein.Stated she wanted to know name of sleep specialist Dr.Klein was going to send her to.Advised I will send message to Dr.Klein's RN Herbert SetaHeather.

## 2016-03-12 NOTE — Telephone Encounter (Signed)
New Message     Has concerns regarding last office visits Bp.  Also would like name of sleep specialist that Dr Graciela HusbandsKlein was referring her to

## 2016-03-16 NOTE — Telephone Encounter (Signed)
Reviewed with Dr. Graciela HusbandsKlein- BP ok- no further recommendations.  For the sleep referral- I advised her that Dr. Graciela HusbandsKlein was not referring her to a sleep specialist, but was trying to get some resources that might be helpful for her insomina.  I advised her that per a message Dr. Graciela HusbandsKlein received from a sleep specialist as below: " The most effective treatment for insomnia of any type is cognitive behavioral therapy of insomnia (CBTI)...there are two websites that can coach patients. Sleepio.com and https://www.west-esparza.com/Shuti.com. Another called cbtforinsomnia.com is helpful."  The patient was appreciative for the information.

## 2016-03-27 ENCOUNTER — Other Ambulatory Visit: Payer: Self-pay | Admitting: Obstetrics & Gynecology

## 2016-03-27 ENCOUNTER — Other Ambulatory Visit (HOSPITAL_COMMUNITY)
Admission: RE | Admit: 2016-03-27 | Discharge: 2016-03-27 | Disposition: A | Discharge status: Home / Self Care | Payer: Medicaid Other | Source: Ambulatory Visit | Attending: Obstetrics and Gynecology | Admitting: Obstetrics and Gynecology

## 2016-03-27 DIAGNOSIS — Z01419 Encounter for gynecological examination (general) (routine) without abnormal findings: Secondary | ICD-10-CM | POA: Diagnosis present

## 2016-03-29 LAB — CYTOLOGY - PAP: DIAGNOSIS: NEGATIVE

## 2016-05-11 ENCOUNTER — Telehealth: Payer: Self-pay | Admitting: Internal Medicine

## 2016-05-11 NOTE — Telephone Encounter (Signed)
°  New Prob   Pt has some questions regarding antiinflammatory medications,Zoloft, and Wellbutrin. Please call.

## 2016-05-11 NOTE — Telephone Encounter (Signed)
NSAIDS usually increase BP readings and can cause fluid retention. Wellbutrin can cause either - hypertension (1% to 4%; may be severe) or hypotension (3%). Zoloft can also cause hypertension (2%).

## 2016-05-11 NOTE — Telephone Encounter (Signed)
Pt has questions regarding how prescription strength antiinflammatory medicines as well as Zoloft or Wellbutrin would affect her blood pressure.  She has been diagnosed with POTS/dysautonomia  She is aware I am forwarding to our PharmD and we will call her back with information.

## 2016-05-17 NOTE — Telephone Encounter (Signed)
Will forward to Dr. Klein to review. 

## 2016-05-21 NOTE — Telephone Encounter (Signed)
She has elevated blood pressure so important that she follow her BP closely with the MDs prescribing these meds

## 2016-05-25 NOTE — Telephone Encounter (Signed)
I called and spoke with the patient and advised her of pharmacy and Dr. Odessa Fleming recommendations regarding her medications. She verbalizes understanding.

## 2016-07-27 ENCOUNTER — Telehealth: Payer: Self-pay | Admitting: Internal Medicine

## 2016-07-27 NOTE — Telephone Encounter (Signed)
I left a message on the patient's identified voice mail- if she is only wanting to take a baby aspirin occasionally, then this should be ok, however, if this is something that she is wanting to take more on a daily basis, that I would need to get feedback from Dr. Graciela HusbandsKlein. I will review this with him when he is back next week and give her a call back.

## 2016-07-27 NOTE — Telephone Encounter (Signed)
Mrs.Primeau is calling to see if baby aspirin safe to take w/ P.O.T.S . Please call thanks

## 2016-08-02 NOTE — Telephone Encounter (Signed)
I left a message of Dr. Odessa FlemingKlein's recommendations regarding aspirin on her identified voice mail.

## 2016-08-02 NOTE — Telephone Encounter (Signed)
No contraindication for ASA with POTS

## 2016-10-09 ENCOUNTER — Encounter: Payer: Self-pay | Admitting: Internal Medicine

## 2016-10-09 ENCOUNTER — Ambulatory Visit (INDEPENDENT_AMBULATORY_CARE_PROVIDER_SITE_OTHER): Payer: Medicaid Other | Admitting: Internal Medicine

## 2016-10-09 VITALS — BP 122/70 | HR 95 | Ht 69.0 in | Wt 165.0 lb

## 2016-10-09 DIAGNOSIS — G909 Disorder of the autonomic nervous system, unspecified: Secondary | ICD-10-CM | POA: Diagnosis not present

## 2016-10-09 NOTE — Patient Instructions (Addendum)
Medication Instructions: - Your physician recommends that you continue on your current medications as directed. Please refer to the Current Medication list given to you today.  Labwork: - none ordered  Procedures/Testing: - none ordered  Follow-Up: - Your physician wants you to follow-up in: June 2019 with Dr. Graciela HusbandsKlein. You will receive a reminder letter in the mail two months in advance. If you don't receive a letter, please call our office to schedule the follow-up appointment.   Any Additional Special Instructions Will Be Listed Below (If Applicable).     If you need a refill on your cardiac medications before your next appointment, please call your pharmacy.

## 2016-10-09 NOTE — Progress Notes (Signed)
      Patient Care Team: Cain SaupeFulp, Cammie, MD as PCP - General (Family Medicine) Cain SaupeFulp, Cammie, MD as Referring Physician (Family Medicine)   HPI  Mia Rubio is a 36 y.o. female Seen in follow-up for spells which when I saw her last month I noted that I did not understand. She has had recurrent syncope.  Her dizziness in the past has been recumbent associated with normal vital signs which to me suggested a noncardiac perhaps neuropsychiatric origin. It was recommended that she be referred for consideration of these components. There also was a component of heat and orthostatic intolerance.  HM had suggested an abdominal binder which had helped considerably    She is currently not on any medicines. She is feeling much much better. She is using propel and water.  She is pregnant. Her first trimester    Past Medical History:  Diagnosis Date  . Hypotension   . Low blood pressure   . Tachycardia     Past Surgical History:  Procedure Laterality Date  . APPENDECTOMY    . BREAST SURGERY    . CESAREAN SECTION N/A 08/19/2014   Procedure: CESAREAN SECTION;  Surgeon: Myna HidalgoJennifer Ozan, DO;  Location: [facility];  Service: Obstetrics;  Laterality: N/A;  . FOOT SURGERY  2011 2012  . KNEE SURGERY  2001  2002  . plantar fasciitis      Current Outpatient Prescriptions  Medication Sig Dispense Refill  . Prenatal Vit-Fe Fumarate-FA (PRENATAL MULTIVITAMIN) TABS tablet Take 2 tablets by mouth daily at 12 noon.     No current facility-administered medications for this visit.     Allergies  Allergen Reactions  . Caffeine Palpitations  . Codeine Itching  . Phenergan [Promethazine Hcl] Itching and Rash      Review of Systems negative except from HPI and PMH  Physical Exam BP 122/70   Pulse 95   Ht 5\' 9"  (1.753 m)   Wt 165 lb (74.8 kg)   SpO2 99%   BMI 24.37 kg/m  Well developed and nourished in no acute distress HENT normal Neck supple with JVP-flat Carotids brisk and full without  bruits Clear Regular rate and rhythm, no murmurs or gallops Abd-soft with active BS without hepatomegaly No Clubbing cyanosis edema Skin-warm and dry A & Oriented  Grossly normal sensory and motor function  Sinus at 87 Intervals 13/09/35   Assessment and  Plan  Syncope  Orthostatic and heat intolerance  Elevated blood pressure  Pregnancy she is doing much better. She is just fourth pregnancy. We discussed the 3 phases. She had significant trouble following her last pregnancy following the C-section. She will need to stay tonight of fluids. Mental health is much improved

## 2016-11-24 ENCOUNTER — Encounter (HOSPITAL_COMMUNITY)
Admission: AD | Disposition: A | Discharge status: Home / Self Care | Payer: Self-pay | Source: Ambulatory Visit | Attending: Obstetrics & Gynecology

## 2016-11-24 ENCOUNTER — Inpatient Hospital Stay (HOSPITAL_COMMUNITY): Payer: Medicaid Other | Admitting: Anesthesiology

## 2016-11-24 ENCOUNTER — Inpatient Hospital Stay (HOSPITAL_COMMUNITY)
Admission: AD | Admit: 2016-11-24 | Discharge: 2016-11-24 | Disposition: A | Discharge status: Home / Self Care | Payer: Medicaid Other | Source: Ambulatory Visit | Attending: Obstetrics & Gynecology | Admitting: Obstetrics & Gynecology

## 2016-11-24 ENCOUNTER — Encounter (HOSPITAL_COMMUNITY): Payer: Self-pay | Admitting: *Deleted

## 2016-11-24 DIAGNOSIS — O021 Missed abortion: Secondary | ICD-10-CM | POA: Diagnosis not present

## 2016-11-24 DIAGNOSIS — F17290 Nicotine dependence, other tobacco product, uncomplicated: Secondary | ICD-10-CM | POA: Insufficient documentation

## 2016-11-24 DIAGNOSIS — N9489 Other specified conditions associated with female genital organs and menstrual cycle: Secondary | ICD-10-CM | POA: Diagnosis present

## 2016-11-24 HISTORY — PX: DILATION AND EVACUATION: SHX1459

## 2016-11-24 LAB — CBC
HCT: 33.7 % — ABNORMAL LOW (ref 36.0–46.0)
Hemoglobin: 11.5 g/dL — ABNORMAL LOW (ref 12.0–15.0)
MCH: 31 pg (ref 26.0–34.0)
MCHC: 34.1 g/dL (ref 30.0–36.0)
MCV: 90.8 fL (ref 78.0–100.0)
PLATELETS: 212 10*3/uL (ref 150–400)
RBC: 3.71 MIL/uL — ABNORMAL LOW (ref 3.87–5.11)
RDW: 13 % (ref 11.5–15.5)
WBC: 6.3 10*3/uL (ref 4.0–10.5)

## 2016-11-24 SURGERY — DILATION AND EVACUATION, UTERUS
Anesthesia: General | Site: Vagina

## 2016-11-24 MED ORDER — ONDANSETRON 8 MG PO TBDP: 8.0000 mg | ORAL_TABLET | Freq: Three times a day (TID) | ORAL | 0 refills | Status: DC | PRN

## 2016-11-24 MED ORDER — LACTATED RINGERS IV SOLN: INTRAVENOUS | Status: DC

## 2016-11-24 MED ORDER — MEPERIDINE HCL 25 MG/ML IJ SOLN: 6.2500 mg | INTRAMUSCULAR | Status: DC | PRN

## 2016-11-24 MED ORDER — LACTATED RINGERS IV SOLN
INTRAVENOUS | Status: DC
  Administered 2016-11-24 (×2): via INTRAVENOUS

## 2016-11-24 MED ORDER — CEFAZOLIN SODIUM-DEXTROSE 2-4 GM/100ML-% IV SOLN
2.0000 g | INTRAVENOUS | Status: AC
  Administered 2016-11-24: 2 g via INTRAVENOUS
  Filled 2016-11-24: qty 100

## 2016-11-24 MED ORDER — FENTANYL CITRATE (PF) 100 MCG/2ML IJ SOLN: 25.0000 ug | INTRAMUSCULAR | Status: DC | PRN

## 2016-11-24 MED ORDER — BUPIVACAINE-EPINEPHRINE 0.5% -1:200000 IJ SOLN
INTRAMUSCULAR | Status: DC | PRN
  Administered 2016-11-24: 10 mL

## 2016-11-24 MED ORDER — ONDANSETRON HCL 4 MG/2ML IJ SOLN
INTRAMUSCULAR | Status: DC | PRN
  Administered 2016-11-24: 4 mg via INTRAVENOUS

## 2016-11-24 MED ORDER — PROPOFOL 10 MG/ML IV BOLUS
INTRAVENOUS | Status: DC | PRN
  Administered 2016-11-24: 180 mg via INTRAVENOUS

## 2016-11-24 MED ORDER — ATROPINE SULFATE 0.4 MG/ML IJ SOLN
INTRAMUSCULAR | Status: DC | PRN
  Administered 2016-11-24: .3 mg via INTRAVENOUS

## 2016-11-24 MED ORDER — ACETAMINOPHEN 10 MG/ML IV SOLN
INTRAVENOUS | Status: DC | PRN
  Administered 2016-11-24: 1000 mg via INTRAVENOUS

## 2016-11-24 MED ORDER — DEXAMETHASONE SODIUM PHOSPHATE 4 MG/ML IJ SOLN
INTRAMUSCULAR | Status: DC | PRN
  Administered 2016-11-24: 4 mg via INTRAVENOUS

## 2016-11-24 MED ORDER — MIDAZOLAM HCL 2 MG/2ML IJ SOLN
INTRAMUSCULAR | Status: DC | PRN
  Administered 2016-11-24: 2 mg via INTRAVENOUS

## 2016-11-24 MED ORDER — METOCLOPRAMIDE HCL 5 MG/ML IJ SOLN: 10.0000 mg | Freq: Once | INTRAMUSCULAR | Status: DC | PRN

## 2016-11-24 MED ORDER — SOD CITRATE-CITRIC ACID 500-334 MG/5ML PO SOLN
30.0000 mL | Freq: Once | ORAL | Status: AC
  Administered 2016-11-24: 30 mL via ORAL
  Filled 2016-11-24: qty 15

## 2016-11-24 MED ORDER — FENTANYL CITRATE (PF) 100 MCG/2ML IJ SOLN
INTRAMUSCULAR | Status: DC | PRN
  Administered 2016-11-24: 50 ug via INTRAVENOUS
  Administered 2016-11-24: 100 ug via INTRAVENOUS
  Administered 2016-11-24 (×2): 50 ug via INTRAVENOUS

## 2016-11-24 MED ORDER — IBUPROFEN 800 MG PO TABS: 800.0000 mg | ORAL_TABLET | Freq: Three times a day (TID) | ORAL | 1 refills | Status: DC | PRN

## 2016-11-24 MED ORDER — LIDOCAINE HCL (CARDIAC) 20 MG/ML IV SOLN
INTRAVENOUS | Status: DC | PRN
  Administered 2016-11-24: 70 mg via INTRAVENOUS

## 2016-11-24 SURGICAL SUPPLY — 19 items
CATH ROBINSON RED A/P 16FR (CATHETERS) ×2 IMPLANT
DECANTER SPIKE VIAL GLASS SM (MISCELLANEOUS) ×2 IMPLANT
GLOVE BIOGEL PI IND STRL 7.0 (GLOVE) ×1 IMPLANT
GLOVE BIOGEL PI IND STRL 8.5 (GLOVE) ×1 IMPLANT
GLOVE BIOGEL PI INDICATOR 7.0 (GLOVE) ×1
GLOVE BIOGEL PI INDICATOR 8.5 (GLOVE) ×1
GLOVE ECLIPSE 8.0 STRL XLNG CF (GLOVE) ×2 IMPLANT
GOWN STRL REUS W/TWL LRG LVL3 (GOWN DISPOSABLE) ×4 IMPLANT
KIT BERKELEY 1ST TRIMESTER 3/8 (MISCELLANEOUS) ×2 IMPLANT
NS IRRIG 1000ML POUR BTL (IV SOLUTION) ×2 IMPLANT
PACK VAGINAL MINOR WOMEN LF (CUSTOM PROCEDURE TRAY) ×2 IMPLANT
PAD OB MATERNITY 4.3X12.25 (PERSONAL CARE ITEMS) ×2 IMPLANT
PAD PREP 24X48 CUFFED NSTRL (MISCELLANEOUS) ×2 IMPLANT
SET BERKELEY SUCTION TUBING (SUCTIONS) ×2 IMPLANT
TOWEL OR 17X24 6PK STRL BLUE (TOWEL DISPOSABLE) ×4 IMPLANT
VACURETTE 10 RIGID CVD (CANNULA) IMPLANT
VACURETTE 7MM CVD STRL WRAP (CANNULA) IMPLANT
VACURETTE 8 RIGID CVD (CANNULA) ×2 IMPLANT
VACURETTE 9 RIGID CVD (CANNULA) IMPLANT

## 2016-11-24 NOTE — Anesthesia Preprocedure Evaluation (Addendum)
Anesthesia Evaluation  Patient identified by MRN, date of birth, ID band Patient awake    Reviewed: Allergy & Precautions, NPO status , Patient's Chart, lab work & pertinent test results  Airway Mallampati: II  TM Distance: >3 FB Neck ROM: Full    Dental no notable dental hx.    Pulmonary neg pulmonary ROS, Current Smoker,    Pulmonary exam normal breath sounds clear to auscultation       Cardiovascular negative cardio ROS Normal cardiovascular exam Rhythm:Regular Rate:Normal  POTS   Neuro/Psych negative neurological ROS  negative psych ROS   GI/Hepatic negative GI ROS, Neg liver ROS,   Endo/Other  negative endocrine ROS  Renal/GU negative Renal ROS  negative genitourinary   Musculoskeletal negative musculoskeletal ROS (+)   Abdominal   Peds negative pediatric ROS (+)  Hematology negative hematology ROS (+)   Anesthesia Other Findings   Reproductive/Obstetrics negative OB ROS                             Anesthesia Physical Anesthesia Plan  ASA: II  Anesthesia Plan: General   Post-op Pain Management:    Induction: Intravenous  PONV Risk Score and Plan: 3 and Ondansetron, Dexamethasone, Midazolam and Treatment may vary due to age or medical condition  Airway Management Planned: LMA  Additional Equipment:   Intra-op Plan:   Post-operative Plan:   Informed Consent: I have reviewed the patients History and Physical, chart, labs and discussed the procedure including the risks, benefits and alternatives for the proposed anesthesia with the patient or authorized representative who has indicated his/her understanding and acceptance.   Dental advisory given  Plan Discussed with: CRNA  Anesthesia Plan Comments:        Anesthesia Quick Evaluation

## 2016-11-24 NOTE — Anesthesia Procedure Notes (Signed)
Procedure Name: LMA Insertion Date/Time: 11/24/2016 8:10 PM Performed by: Elbert Ewings Pre-anesthesia Checklist: Patient identified, Emergency Drugs available, Patient being monitored, Timeout performed and Suction available Patient Re-evaluated:Patient Re-evaluated prior to induction Oxygen Delivery Method: Circle system utilized Preoxygenation: Pre-oxygenation with 100% oxygen Induction Type: IV induction LMA: LMA inserted LMA Size: 4.0 Dental Injury: Teeth and Oropharynx as per pre-operative assessment

## 2016-11-24 NOTE — MAU Note (Signed)
Pt states was dx with a miscarriage last wk, took the medicine on Sunday.  Started 'the process', then things slowed down, started up again bleeding and cramping.  Is here for a D&C.

## 2016-11-24 NOTE — Op Note (Signed)
OPERATIVE NOTE  Mia Rubio  DOB:    [birth year]  MRN:    [id number]  CSN:    [id number]  Date of Surgery:  11/24/2016  Preoperative Diagnosis:  Missed Abortion at [redacted]w[redacted]d Gestation  Postoperative Diagnosis:  Missed Abortion at [redacted]w[redacted]d Gestation  Procedure:  FIRST TRIMESTER SUCTION DILATATION AND CURETTAGE  Surgeon:  Leonard Schwartz, M.D.  Assistant:  None  Anesthetic:  General  Disposition:  The patient is a 36 y.o. year old female who presents at [redacted]w[redacted]d gestation with a nonviable gestation based on ultrasound and/or quantitative hCG values. She understands the indications for her surgical procedure as well as the alternative treatment options. She accepts the risk of, but not limited to, anesthetic complications, bleeding, infections, and possible damage to the surrounding organs.  Findings:  The patient's blood type is O+. A moderate amount of products of conception were removed from within a 10 weeks size uterus. No adnexal masses were appreciated.  Procedure:  The patient was taken to the operating room where a General anesthetic was given. The patient's abdomen, perineum, and vagina were prepped with Betadine. The bladder was drained of urine. The patient was sterilely draped. Examination under anesthesia was performed. A paracervical block was placed using 10 cc of half percent Marcaine with epinephrine. The uterus sounded to 11 centimeters. The cervix was gently dilated. The uterine cavity was evacuated using a size 8 suction curet. The cavity was then cleaned using a medium sharp curet. The cavity was felt to be clean at the end of our procedure. The estimated blood loss was 100 cc. The uterus was reexamined and was noted to be firm. Hemostasis was adequate. Sponge and needle counts were correct. The patient tolerated her but her procedure well. She was awakened from her anesthetic without difficulty. She was transported to the recovery room in stable  condition. The products of conception were sent to pathology.  Followup instructions:  The patient return to see Dr. Stefano Gaul in 2 weeks. She was given prescriptions for pain medications. She was given instructions for patients who've undergone the surgical procedure. She will call for questions or concerns.  Leonard Schwartz, M.D. 11/24/2016 8:41 PM

## 2016-11-24 NOTE — H&P (Signed)
Admission History and Physical Exam for a Gynecology Patient  Mia Rubio is a 36 y.o. female, G2P1001, who presents for uterine cramping and bleeding. She was noted to have an incomplete miscarriage one week ago. She was given misoprostol. She did not have cramping or bleeding for 5 days. Now she is extremely uncomfortable. She has been followed at [facility] obstetric[facility] and gynecology.  OB History    Gravida Para Term Preterm AB Living   SAB TAB Ectopic Multiple Live Births         0 1      Past Medical History:  Diagnosis Date  . Hypotension   . Low blood pressure   . Tachycardia     Prescriptions Prior to Admission  Medication Sig Dispense Refill Last Dose  . Prenatal Vit-Fe Fumarate-FA (PRENATAL MULTIVITAMIN) TABS tablet Take 2 tablets by mouth daily at 12 noon.   Taking    Past Surgical History:  Procedure Laterality Date  . APPENDECTOMY    . BREAST SURGERY    . CESAREAN SECTION N/A 08/19/2014   Procedure: CESAREAN SECTION;  Surgeon: Myna Hidalgo, DO;  Location: [facility];  Service: Obstetrics;  Laterality: N/A;  . FOOT SURGERY  2011 2012  . KNEE SURGERY  2001  2002  . plantar fasciitis      Allergies  Allergen Reactions  . Caffeine Palpitations  . Codeine Itching  . Phenergan [Promethazine Hcl] Itching and Rash    Family History: family history includes Cancer in her maternal grandmother; Diabetes in her maternal grandmother; Heart murmur in her father.  Social History:  reports that she has been smoking E-cigarettes.  She has a 7.50 pack-year smoking history. She has never used smokeless tobacco. She reports that she drinks alcohol. She reports that she does not use drugs.  Review of systems: See HPI.  Admission Physical Exam:  Exam by:: Kirkland Hun, MD Body mass index is 26.11 kg/m.  Blood pressure 131/75, pulse 85, temperature 98.6 F (37 C), temperature source Oral, resp. rate 16, height  (1.727 m), weight 77.9 kg (171 lb 12 oz),  last menstrual period 08/26/2016, SpO2 100 %, unknown if currently breastfeeding.  HEENT:                 Within normal limits Chest:                   Clear Heart:                    Regular rate and rhythm Breasts:                No masses, skin changes, bleeding, or discharge present Abdomen:             Nontender, no masses Extremities:          Grossly normal Neurologic exam: Grossly normal  Pelvic exam:  External genitalia: normal general appearance Vaginal: normal without tenderness, induration or masses Cervix: Closed Adnexa: Tender adnexa, no masses Uterus: tender, enlarged and Soft  Assessment:  First trimester incomplete miscarriage  Failed medical management of miscarriage  Blood type: O positive  Plan:  I reviewed our management options. The risk and benefits of those options were outlined. Questions were answered. The patient wants to proceed with dilation and evacuation.   Janine Limbo 11/24/2016

## 2016-11-24 NOTE — Transfer of Care (Signed)
Immediate Anesthesia Transfer of Care Note  Patient: Mia Rubio  Procedure(s) Performed: Procedure(s): DILATATION AND EVACUATION (N/A)  Patient Location: PACU  Anesthesia Type:General  Level of Consciousness: awake, alert  and oriented  Airway & Oxygen Therapy: Patient Spontanous Breathing and Patient connected to nasal cannula oxygen  Post-op Assessment: Report given to RN and Post -op Vital signs reviewed and stable 123/82, HR 96, RR 16, SaO2 100%.  Post vital signs: Reviewed and stable  Last Vitals:  Vitals:   11/24/16 1650  BP: 131/75  Pulse: 85  Resp: 16  Temp: 37 C  SpO2: 100%    Last Pain:  Vitals:   11/24/16 1931  TempSrc:   PainSc: 5          Complications: No apparent anesthesia complications

## 2016-11-26 NOTE — Anesthesia Postprocedure Evaluation (Signed)
Anesthesia Post Note  Patient: Mia Rubio  Procedure(s) Performed: DILATATION AND EVACUATION (N/A Vagina )     Patient location during evaluation: PACU Anesthesia Type: General Level of consciousness: awake and alert Pain management: pain level controlled Vital Signs Assessment: post-procedure vital signs reviewed and stable Respiratory status: spontaneous breathing, nonlabored ventilation, respiratory function stable and patient connected to nasal cannula oxygen Cardiovascular status: blood pressure returned to baseline and stable Postop Assessment: no apparent nausea or vomiting Anesthetic complications: no    Last Vitals:  Vitals:   11/24/16 2145 11/24/16 2200  BP: 109/75 112/70  Pulse: 78 69  Resp: 16 16  Temp: 36.9 C 36.9 C  SpO2: 100% 100%    Last Pain:  Vitals:   11/24/16 2200  TempSrc:   PainSc: 0-No pain   Pain Goal:                 Phillips Grout

## 2016-11-27 ENCOUNTER — Encounter (HOSPITAL_COMMUNITY): Payer: Self-pay | Admitting: Obstetrics and Gynecology

## 2017-03-05 DIAGNOSIS — H938X2 Other specified disorders of left ear: Secondary | ICD-10-CM | POA: Diagnosis not present

## 2017-03-05 DIAGNOSIS — H9312 Tinnitus, left ear: Secondary | ICD-10-CM | POA: Diagnosis not present

## 2017-03-08 DIAGNOSIS — R062 Wheezing: Secondary | ICD-10-CM | POA: Diagnosis not present

## 2017-03-08 DIAGNOSIS — J069 Acute upper respiratory infection, unspecified: Secondary | ICD-10-CM | POA: Diagnosis not present

## 2017-04-25 ENCOUNTER — Telehealth: Payer: Self-pay | Admitting: Internal Medicine

## 2017-04-25 NOTE — Telephone Encounter (Signed)
Forwarded to Glynda JaegerMelissa Tatum to schedule an office visit.

## 2017-04-25 NOTE — Telephone Encounter (Signed)
Patient calling,  States that she had a miscarriage, she was diagnosed with POTS syndrome and is having a flare -up/symptoms. Ms. Mia Rubio would like to forward an article for your review and would like to discuss flare-up with you

## 2017-04-29 ENCOUNTER — Encounter: Payer: Self-pay | Admitting: Internal Medicine

## 2017-04-29 ENCOUNTER — Telehealth: Payer: Self-pay | Admitting: Internal Medicine

## 2017-04-29 DIAGNOSIS — O200 Threatened abortion: Secondary | ICD-10-CM | POA: Diagnosis not present

## 2017-04-29 NOTE — Telephone Encounter (Signed)
Called pt per staff message to make an appt to discuss flaer up of POTS symptoms, appt made 05-20-17 at 4pm. Pt has questions she would like to ask the nurse-pls advise

## 2017-04-30 NOTE — Telephone Encounter (Signed)
LM for pt to call back.

## 2017-05-20 ENCOUNTER — Encounter: Payer: Self-pay | Admitting: Internal Medicine

## 2017-05-20 ENCOUNTER — Ambulatory Visit (INDEPENDENT_AMBULATORY_CARE_PROVIDER_SITE_OTHER): Payer: BLUE CROSS/BLUE SHIELD | Admitting: Internal Medicine

## 2017-05-20 VITALS — BP 132/80 | HR 66 | Ht 70.0 in | Wt 161.0 lb

## 2017-05-20 DIAGNOSIS — I951 Orthostatic hypotension: Secondary | ICD-10-CM

## 2017-05-20 DIAGNOSIS — G90A Postural orthostatic tachycardia syndrome (POTS): Secondary | ICD-10-CM

## 2017-05-20 DIAGNOSIS — R000 Tachycardia, unspecified: Secondary | ICD-10-CM | POA: Diagnosis not present

## 2017-05-20 DIAGNOSIS — G909 Disorder of the autonomic nervous system, unspecified: Secondary | ICD-10-CM | POA: Diagnosis not present

## 2017-05-20 DIAGNOSIS — I498 Other specified cardiac arrhythmias: Secondary | ICD-10-CM

## 2017-05-20 DIAGNOSIS — R002 Palpitations: Secondary | ICD-10-CM | POA: Diagnosis not present

## 2017-05-20 NOTE — Telephone Encounter (Signed)
Pt has appointment today, MyChart messages forwarded to Dr Graciela HusbandsKlein last week.

## 2017-05-20 NOTE — Patient Instructions (Signed)

## 2017-05-20 NOTE — Progress Notes (Signed)
Patient Care Team: Cain SaupeFulp, Cammie, MD (Inactive) as PCP - General (Family Medicine) Cain SaupeFulp, Cammie, MD (Inactive) as Referring Physician (Family Medicine)   HPI  Mia Rubio is a 37 y.o. female Seen in follow-up POTS  Her dizziness in the past has been recumbent associated with normal vital signs which to me suggested a noncardiac perhaps neuropsychiatric origin. It was recommended that she be referred for consideration of these components. There also was a component of heat and orthostatic intolerance.  An abdominal binder helped considerably    She is here having lost 2 children miscarriages in the last 6 months.  The first was associated with profound and persistent bleeding and resulted in exercise intolerance bedrest and profound dizziness.  She recalls that when she had hormonal suppression of her periods she felt the best that she had in years.  She and her husband are continuing to try to have a second child.  Hence got pregnant again and she miscarried her second child about 3 weeks ago.  She says she is barely able to sit up without being dizzy.  She is not able to stand well.  Fluorescent lights are bothersome.    She is unable to exercise  She is nauseated and has had significant decrease PO intake.  Past Medical History:  Diagnosis Date  . Hypotension   . Low blood pressure   . Tachycardia     Past Surgical History:  Procedure Laterality Date  . APPENDECTOMY    . BREAST SURGERY    . CESAREAN SECTION N/A 08/19/2014   Procedure: CESAREAN SECTION;  Surgeon: Myna HidalgoJennifer Ozan, DO;  Location: [facility];  Service: Obstetrics;  Laterality: N/A;  . DILATION AND EVACUATION N/A 11/24/2016   Procedure: DILATATION AND EVACUATION;  Surgeon: Kirkland HunStringer, Arthur, MD;  Location: [facility];  Service: Gynecology;  Laterality: N/A;  . FOOT SURGERY  2011 2012  . KNEE SURGERY  2001  2002  . plantar fasciitis      Current Outpatient Medications  Medication Sig Dispense Refill  . ibuprofen  (ADVIL,MOTRIN) 800 MG tablet Take 1 tablet (800 mg total) by mouth every 8 (eight) hours as needed. 50 tablet 1  . ondansetron (ZOFRAN ODT) 8 MG disintegrating tablet Take 1 tablet (8 mg total) by mouth every 8 (eight) hours as needed for nausea or vomiting. 30 tablet 0  . Prenatal Vit-Fe Fumarate-FA (PRENATAL MULTIVITAMIN) TABS tablet Take 2 tablets by mouth daily at 12 noon.     No current facility-administered medications for this visit.     Allergies  Allergen Reactions  . Caffeine Palpitations  . Codeine Itching  . Phenergan [Promethazine Hcl] Itching and Rash      Review of Systems negative except from HPI and PMH  Physical Exam BP 132/80   Pulse 66   Ht 5\' 10"  (1.778 m)   Wt 161 lb (73 kg)   LMP 08/26/2016   SpO2 98%   Breastfeeding? Unknown   BMI 23.10 kg/m  Well developed and nourished in no acute distress HENT normal Neck supple with JVP-flat Clear Regular rate and rhythm, no murmurs or gallops Abd-soft with active BS No Clubbing cyanosis edema Skin-warm and dry A & Oriented  Grossly normal sensory and motor function   ECG  Sinus @ 66 02/03/37  Assessment and  Plan  Syncope  Orthostatic and heat intolerance  Elevated blood pressure  Miscarriages   She has symptoms of exercise intolerance and orthostatic intolerance.  Her vital signs are  modestly abnormal with a change in pulse from 76--103.  She has blood pressure elevations that remained mild.  Her p.o. intake is poor.  I have encouraged her to use her Zofran to support her use of fluids.  We have also discussed the importance of aerobic exercise.  While she is aware that her symptoms were much more mild without menses, she and her husband are eager to try to have a second child.  She is not interested in hormonal suppression of menses at this time.  I encouraged her to be attentive to the trauma of miscarriages and the observation that has been made that anxiety/depression can aggravate autonomic  dysfunction  More than 50% of 40 min was spent in counseling related to the above

## 2017-05-27 ENCOUNTER — Telehealth: Payer: Self-pay | Admitting: Internal Medicine

## 2017-05-27 NOTE — Telephone Encounter (Signed)
Patient had concern about the "short" P-R interval noted on her EKG through my chart..  I described the meaning in measurement to her and she verbalized understanding..Marland Kitchen

## 2017-05-27 NOTE — Telephone Encounter (Signed)
New message    Patient wants to know what sinus rhythm with short p/r means?  This was on her ECG report on mychart?

## 2017-06-05 DIAGNOSIS — Z3009 Encounter for other general counseling and advice on contraception: Secondary | ICD-10-CM | POA: Diagnosis not present

## 2017-06-05 DIAGNOSIS — N96 Recurrent pregnancy loss: Secondary | ICD-10-CM | POA: Diagnosis not present

## 2017-06-05 DIAGNOSIS — Z01411 Encounter for gynecological examination (general) (routine) with abnormal findings: Secondary | ICD-10-CM | POA: Diagnosis not present

## 2017-06-05 DIAGNOSIS — I951 Orthostatic hypotension: Secondary | ICD-10-CM | POA: Diagnosis not present

## 2017-06-05 DIAGNOSIS — N898 Other specified noninflammatory disorders of vagina: Secondary | ICD-10-CM | POA: Diagnosis not present

## 2017-06-12 DIAGNOSIS — M9901 Segmental and somatic dysfunction of cervical region: Secondary | ICD-10-CM | POA: Diagnosis not present

## 2017-06-12 DIAGNOSIS — M542 Cervicalgia: Secondary | ICD-10-CM | POA: Diagnosis not present

## 2017-06-12 DIAGNOSIS — M62830 Muscle spasm of back: Secondary | ICD-10-CM | POA: Diagnosis not present

## 2017-06-12 DIAGNOSIS — M47812 Spondylosis without myelopathy or radiculopathy, cervical region: Secondary | ICD-10-CM | POA: Diagnosis not present

## 2017-06-18 DIAGNOSIS — M9901 Segmental and somatic dysfunction of cervical region: Secondary | ICD-10-CM | POA: Diagnosis not present

## 2017-06-18 DIAGNOSIS — M47812 Spondylosis without myelopathy or radiculopathy, cervical region: Secondary | ICD-10-CM | POA: Diagnosis not present

## 2017-06-18 DIAGNOSIS — M542 Cervicalgia: Secondary | ICD-10-CM | POA: Diagnosis not present

## 2017-06-18 DIAGNOSIS — M62830 Muscle spasm of back: Secondary | ICD-10-CM | POA: Diagnosis not present

## 2017-06-20 DIAGNOSIS — M62830 Muscle spasm of back: Secondary | ICD-10-CM | POA: Diagnosis not present

## 2017-06-20 DIAGNOSIS — M542 Cervicalgia: Secondary | ICD-10-CM | POA: Diagnosis not present

## 2017-06-20 DIAGNOSIS — M9901 Segmental and somatic dysfunction of cervical region: Secondary | ICD-10-CM | POA: Diagnosis not present

## 2017-06-20 DIAGNOSIS — M47812 Spondylosis without myelopathy or radiculopathy, cervical region: Secondary | ICD-10-CM | POA: Diagnosis not present

## 2017-06-25 DIAGNOSIS — M542 Cervicalgia: Secondary | ICD-10-CM | POA: Diagnosis not present

## 2017-06-25 DIAGNOSIS — M62830 Muscle spasm of back: Secondary | ICD-10-CM | POA: Diagnosis not present

## 2017-06-25 DIAGNOSIS — M9901 Segmental and somatic dysfunction of cervical region: Secondary | ICD-10-CM | POA: Diagnosis not present

## 2017-06-25 DIAGNOSIS — M47812 Spondylosis without myelopathy or radiculopathy, cervical region: Secondary | ICD-10-CM | POA: Diagnosis not present

## 2017-06-27 DIAGNOSIS — M9901 Segmental and somatic dysfunction of cervical region: Secondary | ICD-10-CM | POA: Diagnosis not present

## 2017-06-27 DIAGNOSIS — M47812 Spondylosis without myelopathy or radiculopathy, cervical region: Secondary | ICD-10-CM | POA: Diagnosis not present

## 2017-06-27 DIAGNOSIS — M62830 Muscle spasm of back: Secondary | ICD-10-CM | POA: Diagnosis not present

## 2017-06-27 DIAGNOSIS — M542 Cervicalgia: Secondary | ICD-10-CM | POA: Diagnosis not present

## 2017-07-02 DIAGNOSIS — M9901 Segmental and somatic dysfunction of cervical region: Secondary | ICD-10-CM | POA: Diagnosis not present

## 2017-07-02 DIAGNOSIS — M62830 Muscle spasm of back: Secondary | ICD-10-CM | POA: Diagnosis not present

## 2017-07-02 DIAGNOSIS — M542 Cervicalgia: Secondary | ICD-10-CM | POA: Diagnosis not present

## 2017-07-02 DIAGNOSIS — M47812 Spondylosis without myelopathy or radiculopathy, cervical region: Secondary | ICD-10-CM | POA: Diagnosis not present

## 2017-07-04 DIAGNOSIS — M9901 Segmental and somatic dysfunction of cervical region: Secondary | ICD-10-CM | POA: Diagnosis not present

## 2017-07-04 DIAGNOSIS — M47812 Spondylosis without myelopathy or radiculopathy, cervical region: Secondary | ICD-10-CM | POA: Diagnosis not present

## 2017-07-04 DIAGNOSIS — M62830 Muscle spasm of back: Secondary | ICD-10-CM | POA: Diagnosis not present

## 2017-07-04 DIAGNOSIS — M542 Cervicalgia: Secondary | ICD-10-CM | POA: Diagnosis not present

## 2017-07-08 ENCOUNTER — Encounter: Payer: Self-pay | Admitting: Internal Medicine

## 2017-07-09 DIAGNOSIS — M47812 Spondylosis without myelopathy or radiculopathy, cervical region: Secondary | ICD-10-CM | POA: Diagnosis not present

## 2017-07-09 DIAGNOSIS — M9901 Segmental and somatic dysfunction of cervical region: Secondary | ICD-10-CM | POA: Diagnosis not present

## 2017-07-09 DIAGNOSIS — M542 Cervicalgia: Secondary | ICD-10-CM | POA: Diagnosis not present

## 2017-07-09 DIAGNOSIS — M62830 Muscle spasm of back: Secondary | ICD-10-CM | POA: Diagnosis not present

## 2017-07-11 DIAGNOSIS — M9901 Segmental and somatic dysfunction of cervical region: Secondary | ICD-10-CM | POA: Diagnosis not present

## 2017-07-11 DIAGNOSIS — M542 Cervicalgia: Secondary | ICD-10-CM | POA: Diagnosis not present

## 2017-07-11 DIAGNOSIS — M62830 Muscle spasm of back: Secondary | ICD-10-CM | POA: Diagnosis not present

## 2017-07-11 DIAGNOSIS — M47812 Spondylosis without myelopathy or radiculopathy, cervical region: Secondary | ICD-10-CM | POA: Diagnosis not present

## 2017-07-16 DIAGNOSIS — M47812 Spondylosis without myelopathy or radiculopathy, cervical region: Secondary | ICD-10-CM | POA: Diagnosis not present

## 2017-07-16 DIAGNOSIS — M542 Cervicalgia: Secondary | ICD-10-CM | POA: Diagnosis not present

## 2017-07-16 DIAGNOSIS — M9901 Segmental and somatic dysfunction of cervical region: Secondary | ICD-10-CM | POA: Diagnosis not present

## 2017-07-16 DIAGNOSIS — M62830 Muscle spasm of back: Secondary | ICD-10-CM | POA: Diagnosis not present

## 2017-07-18 DIAGNOSIS — M62830 Muscle spasm of back: Secondary | ICD-10-CM | POA: Diagnosis not present

## 2017-07-18 DIAGNOSIS — M542 Cervicalgia: Secondary | ICD-10-CM | POA: Diagnosis not present

## 2017-07-18 DIAGNOSIS — M9901 Segmental and somatic dysfunction of cervical region: Secondary | ICD-10-CM | POA: Diagnosis not present

## 2017-07-18 DIAGNOSIS — M47812 Spondylosis without myelopathy or radiculopathy, cervical region: Secondary | ICD-10-CM | POA: Diagnosis not present

## 2017-07-26 DIAGNOSIS — G4700 Insomnia, unspecified: Secondary | ICD-10-CM | POA: Diagnosis not present

## 2017-07-26 DIAGNOSIS — J069 Acute upper respiratory infection, unspecified: Secondary | ICD-10-CM | POA: Diagnosis not present

## 2017-07-31 DIAGNOSIS — Z3169 Encounter for other general counseling and advice on procreation: Secondary | ICD-10-CM | POA: Diagnosis not present

## 2017-07-31 DIAGNOSIS — Z3141 Encounter for fertility testing: Secondary | ICD-10-CM | POA: Diagnosis not present

## 2017-08-01 DIAGNOSIS — Z3141 Encounter for fertility testing: Secondary | ICD-10-CM | POA: Diagnosis not present

## 2017-08-06 DIAGNOSIS — M62830 Muscle spasm of back: Secondary | ICD-10-CM | POA: Diagnosis not present

## 2017-08-06 DIAGNOSIS — M542 Cervicalgia: Secondary | ICD-10-CM | POA: Diagnosis not present

## 2017-08-06 DIAGNOSIS — M9901 Segmental and somatic dysfunction of cervical region: Secondary | ICD-10-CM | POA: Diagnosis not present

## 2017-08-06 DIAGNOSIS — M47812 Spondylosis without myelopathy or radiculopathy, cervical region: Secondary | ICD-10-CM | POA: Diagnosis not present

## 2017-08-08 DIAGNOSIS — M9901 Segmental and somatic dysfunction of cervical region: Secondary | ICD-10-CM | POA: Diagnosis not present

## 2017-08-08 DIAGNOSIS — M47812 Spondylosis without myelopathy or radiculopathy, cervical region: Secondary | ICD-10-CM | POA: Diagnosis not present

## 2017-08-08 DIAGNOSIS — M542 Cervicalgia: Secondary | ICD-10-CM | POA: Diagnosis not present

## 2017-08-08 DIAGNOSIS — M62830 Muscle spasm of back: Secondary | ICD-10-CM | POA: Diagnosis not present

## 2017-08-13 DIAGNOSIS — M47812 Spondylosis without myelopathy or radiculopathy, cervical region: Secondary | ICD-10-CM | POA: Diagnosis not present

## 2017-08-13 DIAGNOSIS — M9901 Segmental and somatic dysfunction of cervical region: Secondary | ICD-10-CM | POA: Diagnosis not present

## 2017-08-13 DIAGNOSIS — M62830 Muscle spasm of back: Secondary | ICD-10-CM | POA: Diagnosis not present

## 2017-08-13 DIAGNOSIS — M542 Cervicalgia: Secondary | ICD-10-CM | POA: Diagnosis not present

## 2017-08-15 DIAGNOSIS — M9901 Segmental and somatic dysfunction of cervical region: Secondary | ICD-10-CM | POA: Diagnosis not present

## 2017-08-15 DIAGNOSIS — M62830 Muscle spasm of back: Secondary | ICD-10-CM | POA: Diagnosis not present

## 2017-08-15 DIAGNOSIS — M542 Cervicalgia: Secondary | ICD-10-CM | POA: Diagnosis not present

## 2017-08-15 DIAGNOSIS — M47812 Spondylosis without myelopathy or radiculopathy, cervical region: Secondary | ICD-10-CM | POA: Diagnosis not present

## 2017-08-20 DIAGNOSIS — M9901 Segmental and somatic dysfunction of cervical region: Secondary | ICD-10-CM | POA: Diagnosis not present

## 2017-08-20 DIAGNOSIS — M62830 Muscle spasm of back: Secondary | ICD-10-CM | POA: Diagnosis not present

## 2017-08-20 DIAGNOSIS — M47812 Spondylosis without myelopathy or radiculopathy, cervical region: Secondary | ICD-10-CM | POA: Diagnosis not present

## 2017-08-20 DIAGNOSIS — M542 Cervicalgia: Secondary | ICD-10-CM | POA: Diagnosis not present

## 2017-08-27 DIAGNOSIS — M542 Cervicalgia: Secondary | ICD-10-CM | POA: Diagnosis not present

## 2017-08-27 DIAGNOSIS — M9901 Segmental and somatic dysfunction of cervical region: Secondary | ICD-10-CM | POA: Diagnosis not present

## 2017-08-27 DIAGNOSIS — M47812 Spondylosis without myelopathy or radiculopathy, cervical region: Secondary | ICD-10-CM | POA: Diagnosis not present

## 2017-08-27 DIAGNOSIS — M62830 Muscle spasm of back: Secondary | ICD-10-CM | POA: Diagnosis not present

## 2017-08-30 DIAGNOSIS — Z3141 Encounter for fertility testing: Secondary | ICD-10-CM | POA: Diagnosis not present

## 2017-09-03 DIAGNOSIS — M47812 Spondylosis without myelopathy or radiculopathy, cervical region: Secondary | ICD-10-CM | POA: Diagnosis not present

## 2017-09-03 DIAGNOSIS — M9901 Segmental and somatic dysfunction of cervical region: Secondary | ICD-10-CM | POA: Diagnosis not present

## 2017-09-03 DIAGNOSIS — M62830 Muscle spasm of back: Secondary | ICD-10-CM | POA: Diagnosis not present

## 2017-09-03 DIAGNOSIS — M542 Cervicalgia: Secondary | ICD-10-CM | POA: Diagnosis not present

## 2017-09-12 DIAGNOSIS — M62830 Muscle spasm of back: Secondary | ICD-10-CM | POA: Diagnosis not present

## 2017-09-12 DIAGNOSIS — M9901 Segmental and somatic dysfunction of cervical region: Secondary | ICD-10-CM | POA: Diagnosis not present

## 2017-09-12 DIAGNOSIS — M542 Cervicalgia: Secondary | ICD-10-CM | POA: Diagnosis not present

## 2017-09-12 DIAGNOSIS — M47812 Spondylosis without myelopathy or radiculopathy, cervical region: Secondary | ICD-10-CM | POA: Diagnosis not present

## 2017-10-07 ENCOUNTER — Telehealth: Payer: Self-pay | Admitting: Internal Medicine

## 2017-10-07 NOTE — Telephone Encounter (Signed)
No message needed °

## 2017-10-09 ENCOUNTER — Encounter (HOSPITAL_BASED_OUTPATIENT_CLINIC_OR_DEPARTMENT_OTHER): Payer: Self-pay

## 2017-10-09 ENCOUNTER — Other Ambulatory Visit: Payer: Self-pay

## 2017-10-09 ENCOUNTER — Emergency Department (HOSPITAL_BASED_OUTPATIENT_CLINIC_OR_DEPARTMENT_OTHER)
Admission: EM | Admit: 2017-10-09 | Discharge: 2017-10-10 | Disposition: A | Discharge status: Home / Self Care | Payer: BLUE CROSS/BLUE SHIELD | Attending: Emergency Medicine | Admitting: Emergency Medicine

## 2017-10-09 DIAGNOSIS — R000 Tachycardia, unspecified: Secondary | ICD-10-CM

## 2017-10-09 DIAGNOSIS — Z87891 Personal history of nicotine dependence: Secondary | ICD-10-CM | POA: Diagnosis not present

## 2017-10-09 DIAGNOSIS — I951 Orthostatic hypotension: Secondary | ICD-10-CM

## 2017-10-09 DIAGNOSIS — G90A Postural orthostatic tachycardia syndrome (POTS): Secondary | ICD-10-CM

## 2017-10-09 DIAGNOSIS — I498 Other specified cardiac arrhythmias: Secondary | ICD-10-CM

## 2017-10-09 DIAGNOSIS — Z79899 Other long term (current) drug therapy: Secondary | ICD-10-CM | POA: Diagnosis not present

## 2017-10-09 DIAGNOSIS — R002 Palpitations: Secondary | ICD-10-CM | POA: Diagnosis not present

## 2017-10-09 HISTORY — DX: Orthostatic hypotension: I95.1

## 2017-10-09 HISTORY — DX: Tachycardia, unspecified: R00.0

## 2017-10-09 HISTORY — DX: Postural orthostatic tachycardia syndrome (POTS): G90.A

## 2017-10-09 HISTORY — DX: Other specified cardiac arrhythmias: I49.8

## 2017-10-09 LAB — URINALYSIS, ROUTINE W REFLEX MICROSCOPIC
Bilirubin Urine: NEGATIVE
GLUCOSE, UA: NEGATIVE mg/dL
HGB URINE DIPSTICK: NEGATIVE
KETONES UR: NEGATIVE mg/dL
LEUKOCYTES UA: NEGATIVE
Nitrite: NEGATIVE
PH: 6.5 (ref 5.0–8.0)
Protein, ur: NEGATIVE mg/dL
Specific Gravity, Urine: <1.005 — ABNORMAL LOW (ref 1.005–1.030)

## 2017-10-09 LAB — CBC WITH DIFFERENTIAL/PLATELET
Basophils Absolute: 0 10*3/uL (ref 0.0–0.1)
Basophils Relative: 0 %
EOS PCT: 0 %
Eosinophils Absolute: 0 10*3/uL (ref 0.0–0.7)
HEMATOCRIT: 40 % (ref 36.0–46.0)
Hemoglobin: 13.7 g/dL (ref 12.0–15.0)
LYMPHS ABS: 1.1 10*3/uL (ref 0.7–4.0)
LYMPHS PCT: 13 %
MCH: 30.5 pg (ref 26.0–34.0)
MCHC: 34.3 g/dL (ref 30.0–36.0)
MCV: 89.1 fL (ref 78.0–100.0)
MONO ABS: 0.4 10*3/uL (ref 0.1–1.0)
Monocytes Relative: 4 %
NEUTROS ABS: 6.9 10*3/uL (ref 1.7–7.7)
Neutrophils Relative %: 83 %
PLATELETS: 202 10*3/uL (ref 150–400)
RBC: 4.49 MIL/uL (ref 3.87–5.11)
RDW: 12.6 % (ref 11.5–15.5)
WBC: 8.4 10*3/uL (ref 4.0–10.5)

## 2017-10-09 MED ORDER — SODIUM CHLORIDE 0.9 % IV BOLUS
2000.0000 mL | Freq: Once | INTRAVENOUS | Status: AC
Start: 2017-10-09 — End: 2017-10-10
  Administered 2017-10-10: 2000 mL via INTRAVENOUS

## 2017-10-09 NOTE — ED Provider Notes (Signed)
[facility] EMERGENCY DEPARTMENT Provider Note   CSN: [id number] Arrival date & time: 10/09/17  2219     History   Chief Complaint Chief Complaint  Patient presents with  . Tachycardia    HPI Mia Rubio is a 37 y.o. female.  Patient is a 37 year old female with past medical history of pots disease.  She presents today for evaluation of weakness, low breath pressure, and heart palpitations.  She reports a bad episode of her pots about 3 weeks ago and has not felt well since.  She has tried taking fluids at home, however does not feel as though she is adequately hydrating.  She called her primary doctor who told her to come here.  She denies other symptoms such as vomiting or diarrhea.  She denies any chest pain or difficulty breathing.  The history is provided by the patient.    Past Medical History:  Diagnosis Date  . Hypotension   . Low blood pressure   . POTS (postural orthostatic tachycardia syndrome)   . Tachycardia     Patient Active Problem List   Diagnosis Date Noted  . Missed abortion 11/24/2016  . Intrauterine normal pregnancy 08/19/2014    Past Surgical History:  Procedure Laterality Date  . APPENDECTOMY    . BREAST SURGERY    . CESAREAN SECTION N/A 08/19/2014   Procedure: CESAREAN SECTION;  Surgeon: Myna HidalgoJennifer Ozan, DO;  Location: [facility];  Service: Obstetrics;  Laterality: N/A;  . DILATION AND EVACUATION N/A 11/24/2016   Procedure: DILATATION AND EVACUATION;  Surgeon: Kirkland HunStringer, Arthur, MD;  Location: [facility];  Service: Gynecology;  Laterality: N/A;  . FOOT SURGERY  2011 2012  . KNEE SURGERY  2001  2002  . plantar fasciitis       OB History    Gravida  2   Para  1   Term  1   Preterm      AB      Living  1     SAB      TAB      Ectopic      Multiple  0   Live Births  1            Home Medications    Prior to Admission medications   Medication Sig Start Date End Date Taking? Authorizing Provider  ibuprofen  (ADVIL,MOTRIN) 800 MG tablet Take 1 tablet (800 mg total) by mouth every 8 (eight) hours as needed. 11/24/16   Kirkland HunStringer, Arthur, MD  ondansetron (ZOFRAN ODT) 8 MG disintegrating tablet Take 1 tablet (8 mg total) by mouth every 8 (eight) hours as needed for nausea or vomiting. 11/24/16   Kirkland HunStringer, Arthur, MD  Prenatal Vit-Fe Fumarate-FA (PRENATAL MULTIVITAMIN) TABS tablet Take 2 tablets by mouth daily at 12 noon.    [provider]    Family History Family History  Problem Relation Age of Onset  . Cancer Maternal Grandmother        leg  . Diabetes Maternal Grandmother   . Heart murmur Father     Social History Social History   Tobacco Use  . Smoking status: Former Smoker    Packs/day: 0.00    Years: 0.00    Pack years: 0.00  . Smokeless tobacco: Never Used  . Tobacco comment: only when drinks  Substance Use Topics  . Alcohol use: Yes    Comment: rare  . Drug use: No     Allergies   Caffeine; Codeine; and Phenergan [promethazine hcl]  Review of Systems Review of Systems  All other systems reviewed and are negative.    Physical Exam Updated Vital Signs BP (!) 85/59   Pulse 87   Temp 98.6 F (37 C) (Oral)   Resp 16   Ht 5\' 9"  (1.753 m)   Wt 65.8 kg   SpO2 100%   BMI 21.41 kg/m   Physical Exam  Constitutional: She is oriented to person, place, and time. She appears well-developed and well-nourished. No distress.  HENT:  Head: Normocephalic and atraumatic.  Mouth/Throat: Oropharynx is clear and moist.  Eyes: Pupils are equal, round, and reactive to light. EOM are normal.  Neck: Normal range of motion. Neck supple.  Cardiovascular: Normal rate and regular rhythm. Exam reveals no gallop and no friction rub.  No murmur heard. Pulmonary/Chest: Effort normal and breath sounds normal. No respiratory distress. She has no wheezes.  Abdominal: Soft. Bowel sounds are normal. She exhibits no distension. There is no tenderness.  Musculoskeletal: Normal range of  motion.  Neurological: She is alert and oriented to person, place, and time. No cranial nerve deficit. She exhibits normal muscle tone. Coordination normal.  Skin: Skin is warm and dry. She is not diaphoretic.  Nursing note and vitals reviewed.    ED Treatments / Results  Labs (all labs ordered are listed, but only abnormal results are displayed) Labs Reviewed  BASIC METABOLIC PANEL  CBC WITH DIFFERENTIAL/PLATELET  URINALYSIS, ROUTINE W REFLEX MICROSCOPIC  HCG, SERUM, QUALITATIVE    EKG EKG Interpretation  Date/Time:  Wednesday October 09 2017 22:26:48 EDT Ventricular Rate:  116 PR Interval:  122 QRS Duration: 82 QT Interval:  306 QTC Calculation: 425 R Axis:   86 Text Interpretation:  Sinus tachycardia Nonspecific ST abnormality Abnormal ECG SINCE LAST TRACING HEART RATE HAS INCREASED 4/17 Confirmed by Meridee ScoreButler, Michael [phone]) on 10/09/2017 10:30:47 PM   Radiology No results found.  Procedures Procedures (including critical care time)  Medications Ordered in ED Medications  sodium chloride 0.9 % bolus 2,000 mL (has no administration in time range)     Initial Impression / Assessment and Plan / ED Course  I have reviewed the triage vital signs and the nursing notes.  Pertinent labs & imaging results that were available during my care of the patient were reviewed by me and considered in my medical decision making (see chart for details).  Patient with history of POTS presenting with weakness, fatigue, and feeling dehydrated.  Her work-up today is unremarkable including CBC and electrolytes.  Pregnancy test is negative.  She was given 2 L of normal saline here in the ER and it is feeling better.  She is hemodynamically stable and I believe appropriate for discharge.  Final Clinical Impressions(s) / ED Diagnoses   Final diagnoses:  None    ED Discharge Orders    None       Geoffery Lyonselo, Ashawna Hanback, MD 10/10/17 [phone]

## 2017-10-09 NOTE — ED Triage Notes (Signed)
Pt states she has hx of POTS-had episode 7/21-c/o nausea, loss of appetite, anxiety-states she has not been seen by PCP/she spoke with PCP and was advised to come to ED

## 2017-10-10 DIAGNOSIS — G4700 Insomnia, unspecified: Secondary | ICD-10-CM | POA: Diagnosis not present

## 2017-10-10 DIAGNOSIS — F419 Anxiety disorder, unspecified: Secondary | ICD-10-CM | POA: Diagnosis not present

## 2017-10-10 LAB — BASIC METABOLIC PANEL
Anion gap: 10 (ref 5–15)
BUN: 7 mg/dL (ref 6–20)
CHLORIDE: 106 mmol/L (ref 98–111)
CO2: 23 mmol/L (ref 22–32)
CREATININE: 0.7 mg/dL (ref 0.44–1.00)
Calcium: 9.5 mg/dL (ref 8.9–10.3)
GFR calc Af Amer: >60 mL/min (ref >60)
GFR calc non Af Amer: >60 mL/min (ref >60)
GLUCOSE: 91 mg/dL (ref 70–99)
POTASSIUM: 3.8 mmol/L (ref 3.5–5.1)
Sodium: 139 mmol/L (ref 135–145)

## 2017-10-10 LAB — HCG, SERUM, QUALITATIVE: Preg, Serum: NEGATIVE

## 2017-10-10 NOTE — Discharge Instructions (Addendum)
Drink plenty of fluids and get plenty of rest.  Follow-up with your primary doctor in the next week, and return to the ER if symptoms significantly worsen or change. 

## 2017-10-22 ENCOUNTER — Other Ambulatory Visit: Payer: Self-pay

## 2017-10-22 MED ORDER — FLUDROCORTISONE ACETATE 0.1 MG PO TABS: 0.1000 mg | ORAL_TABLET | Freq: Two times a day (BID) | ORAL | 3 refills | Status: DC

## 2017-11-01 ENCOUNTER — Telehealth: Payer: Self-pay | Admitting: Internal Medicine

## 2017-11-01 NOTE — Telephone Encounter (Signed)
° °  Patient seeking advice for POTS flare up. Patient wants to know if Dr Graciela Husbands would recommend her seeing a Neurologist

## 2017-11-01 NOTE — Telephone Encounter (Signed)
Spoke with pt who states she has been having prolonged periods of dizziness, light disturbances, nausea, etc. She states her BP has been running "OK" so she has not started her florinef as of yet. Given her complexity and prolonged symptoms, I have arranged for her to see Dr Graciela Husbands in the office on 9/11. Pt understands there may be some schedule changes, but as of now she is on the schedule. She states she will call or message back if there are any additional questions.

## 2017-11-06 ENCOUNTER — Encounter: Payer: Self-pay | Admitting: Internal Medicine

## 2017-11-06 ENCOUNTER — Ambulatory Visit: Payer: BLUE CROSS/BLUE SHIELD | Admitting: Internal Medicine

## 2017-11-06 VITALS — BP 109/79 | HR 87 | Ht 69.0 in | Wt 143.6 lb

## 2017-11-06 DIAGNOSIS — I951 Orthostatic hypotension: Secondary | ICD-10-CM

## 2017-11-06 DIAGNOSIS — G909 Disorder of the autonomic nervous system, unspecified: Secondary | ICD-10-CM | POA: Diagnosis not present

## 2017-11-06 DIAGNOSIS — R42 Dizziness and giddiness: Secondary | ICD-10-CM

## 2017-11-06 DIAGNOSIS — R000 Tachycardia, unspecified: Secondary | ICD-10-CM | POA: Diagnosis not present

## 2017-11-06 DIAGNOSIS — G90A Postural orthostatic tachycardia syndrome (POTS): Secondary | ICD-10-CM

## 2017-11-06 DIAGNOSIS — R55 Syncope and collapse: Secondary | ICD-10-CM | POA: Diagnosis not present

## 2017-11-06 DIAGNOSIS — I498 Other specified cardiac arrhythmias: Secondary | ICD-10-CM

## 2017-11-06 DIAGNOSIS — R002 Palpitations: Secondary | ICD-10-CM | POA: Diagnosis not present

## 2017-11-06 NOTE — Patient Instructions (Signed)
Medication Instructions:  Your physician has recommended you make the following change in your medication:   1. Stop your Zoloft  Labwork: You will have labs drawn today: Cortisol, BMP, TSH  Testing/Procedures: None ordered.  Follow-Up: Your physician recommends that you schedule a follow-up appointment in:   3 months with Dr Graciela Husbands.   Any Other Special Instructions Will Be Listed Below (If Applicable).  I will call you in the morning to organize a 2 liter normal saline bolus appointment.   If you need a refill on your cardiac medications before your next appointment, please call your pharmacy.

## 2017-11-06 NOTE — Progress Notes (Addendum)
Patient Care Team: Danie Binder as PCP - General (Physician Assistant) Cain Saupe, MD as Referring Physician (Family Medicine)   HPI  Mia Rubio is a 37 y.o. female Seen in follow-up POTS  Her dizziness in the past has been recumbent associated with normal vital signs which to me suggested a noncardiac perhaps neuropsychiatric origin. It was recommended that she be referred for consideration of these components. There also was a component of heat and orthostatic intolerance.  An abdominal binder helped considerably    She is here having lost 2 children miscarriages in the last 6 months.  The first was associated with profound and persistent bleeding and resulted in exercise intolerance bedrest and profound dizziness.  She recalls that when she had hormonal suppression of her periods she felt the best that she had in years.  She and her husband are continuing to try to have a second child.  Hence got pregnant again and she miscarried her second child about 6 months ago.  That is also the timeframe in which she began to have significantly worsened symptoms.  When seen 3/19, >>"She says she is barely able to sit up without being dizzy.  She is not able to stand well.  Fluorescent lights are bothersome.  She is unable to exercise  She is nauseated and has had significant decrease PO intake."  The symptoms have continued to worsen.    A second major event supervened about 2 months ago>> she ended up with an episode of profound anxiety associated with light and sound hypersensitivity both of which have worsened since then.  She has had difficulty with her ADLs, being bedbound, not able to take care of her 36-year-old son, not well able to eat.  She has had problems with diarrhea which were worsened by the recent introduction of Zoloft    Past Medical History:  Diagnosis Date  . Hypotension   . Low blood pressure   . POTS (postural orthostatic tachycardia syndrome)   .  Tachycardia     Past Surgical History:  Procedure Laterality Date  . APPENDECTOMY    . BREAST SURGERY    . CESAREAN SECTION N/A 08/19/2014   Procedure: CESAREAN SECTION;  Surgeon: Myna Hidalgo, DO;  Location: [facility];  Service: Obstetrics;  Laterality: N/A;  . DILATION AND EVACUATION N/A 11/24/2016   Procedure: DILATATION AND EVACUATION;  Surgeon: Kirkland Hun, MD;  Location: [facility];  Service: Gynecology;  Laterality: N/A;  . FOOT SURGERY  2011 2012  . KNEE SURGERY  2001  2002  . plantar fasciitis      Current Outpatient Medications  Medication Sig Dispense Refill  . ALPRAZolam (XANAX XR) 1 MG 24 hr tablet Take 1 mg by mouth daily.    Marland Kitchen ALPRAZolam (XANAX) 0.5 MG tablet Take 0.5 mg by mouth at bedtime as needed for anxiety.    . dicyclomine (BENTYL) 20 MG tablet Take 20 mg by mouth every 6 (six) hours as needed for spasms.    . fludrocortisone (FLORINEF) 0.1 MG tablet Take 1 tablet (0.1 mg total) by mouth 2 (two) times daily. 180 tablet 3  . levonorgestrel-ethinyl estradiol (SEASONALE,INTROVALE,JOLESSA) 0.15-0.03 MG tablet Take 1 tablet by mouth daily.    . meclizine (ANTIVERT) 25 MG tablet Take 25 mg by mouth 3 (three) times daily as needed for dizziness.    . ondansetron (ZOFRAN ODT) 8 MG disintegrating tablet Take 1 tablet (8 mg total) by mouth every 8 (eight) hours as  needed for nausea or vomiting. 30 tablet 0  . sertraline (ZOLOFT) 50 MG tablet Take 50 mg by mouth daily.    Marland Kitchen zolpidem (AMBIEN) 10 MG tablet Take 10 mg by mouth at bedtime as needed for sleep.     No current facility-administered medications for this visit.     Allergies  Allergen Reactions  . Caffeine Palpitations  . Codeine Itching  . Phenergan [Promethazine Hcl] Itching and Rash      Review of Systems negative except from HPI and PMH  Physical Exam BP 109/79   Pulse 87   Ht 5\' 9"  (1.753 m)   Wt 143 lb 9.6 oz (65.1 kg)   SpO2 99%   BMI 21.21 kg/m  Well developed and nourished in no acute  distress HENT normal Neck supple with JVP-flat Clear Regular rate and rhythm, no murmurs or gallops Abd-soft with active BS No Clubbing cyanosis edema Skin-warm and dry A & Oriented  Grossly normal sensory and motor function   ECG     Assessment and  Plan  Syncope  Orthostatic and heat intolerance  Postural Tachycardia  Elevated blood pressure  Miscarriages--multiple  Anxiety    Mechanism of the 2 events is not clear.  Anxiety is overwhelming.  Her GI symptoms are debilitating.  I am not quite sure how it all ties together.  I do not know how the light and sound hypersensitivity may relate to the autonomic dysfunction evidence of which is objective today.  However, reviewing notes back to initial visit 2017 when it ws noted that she had a negative tilt test, the orthostasis today may be, to some ( a perhaps large) degree related to her deconditioning.  Furthermore, as noted above, when seen 3/19, these symptoms were already described, occurring in the wake of her most recent miscarriage.  I wonder also to what degree that emotional trauma may be contributing, either directly or indirectly, early research highlighting the aggravation of autonomic syndromes by anxiety/depression etc.    For now, I suggested I will be in touch with Dr. Leretha Dykes to just think out loud with her as to what this might be.  In addition, I have asked her to stop her Zoloft.  Aggravation of her GI symptoms at this point is detrimental. I would wonder if Xanax in the short-term i.e. a number of weeks would be particularly beneficial given the overwhelming anxiety and her inability to sleep.  I have asked her to reach out to her PCP about this.  I suggested that she get established with a GI doctor; there is apparently a GI doctor at [facility] name is on the dysautonomia Reports who might be able to guide her in this.  In the interim, have encouraged her to increase her p.o. intake.  She is using pickle  juice for sodium intake.  We will try and get her set up for IV fluids tomorrow since she is so far behind.  Check today her cortisol and electrolytes.      More than 50% of 75 min was spent in counseling related to the above

## 2017-11-07 ENCOUNTER — Other Ambulatory Visit: Payer: Self-pay | Admitting: Internal Medicine

## 2017-11-07 ENCOUNTER — Telehealth: Payer: Self-pay | Admitting: Internal Medicine

## 2017-11-07 DIAGNOSIS — I951 Orthostatic hypotension: Principal | ICD-10-CM

## 2017-11-07 DIAGNOSIS — I498 Other specified cardiac arrhythmias: Secondary | ICD-10-CM

## 2017-11-07 DIAGNOSIS — R000 Tachycardia, unspecified: Principal | ICD-10-CM

## 2017-11-07 DIAGNOSIS — G90A Postural orthostatic tachycardia syndrome (POTS): Secondary | ICD-10-CM

## 2017-11-07 LAB — CORTISOL: CORTISOL: 8.1 ug/dL

## 2017-11-07 LAB — TSH: TSH: 1.61 u[IU]/mL (ref 0.450–4.500)

## 2017-11-07 NOTE — Telephone Encounter (Signed)
New message:       Bridgette from [facility] is calling about some iv fluid orders that needs to be put in for the pt. She states they sent a fax order for this but the order has not been put in

## 2017-11-07 NOTE — Telephone Encounter (Signed)
Orders have been placed.

## 2017-11-08 ENCOUNTER — Ambulatory Visit (HOSPITAL_COMMUNITY)
Admission: RE | Admit: 2017-11-08 | Discharge: 2017-11-08 | Disposition: A | Discharge status: Home / Self Care | Payer: BLUE CROSS/BLUE SHIELD | Source: Ambulatory Visit | Attending: Internal Medicine | Admitting: Internal Medicine

## 2017-11-08 DIAGNOSIS — I498 Other specified cardiac arrhythmias: Secondary | ICD-10-CM

## 2017-11-08 DIAGNOSIS — R000 Tachycardia, unspecified: Secondary | ICD-10-CM | POA: Insufficient documentation

## 2017-11-08 DIAGNOSIS — I951 Orthostatic hypotension: Secondary | ICD-10-CM | POA: Insufficient documentation

## 2017-11-08 DIAGNOSIS — G90A Postural orthostatic tachycardia syndrome (POTS): Secondary | ICD-10-CM

## 2017-11-08 MED ORDER — SODIUM CHLORIDE 0.9 % IV SOLN
INTRAVENOUS | Status: AC
  Administered 2017-11-08 (×2): via INTRAVENOUS

## 2017-11-19 DIAGNOSIS — J019 Acute sinusitis, unspecified: Secondary | ICD-10-CM | POA: Diagnosis not present

## 2017-11-26 DIAGNOSIS — J069 Acute upper respiratory infection, unspecified: Secondary | ICD-10-CM | POA: Diagnosis not present

## 2017-11-26 DIAGNOSIS — R091 Pleurisy: Secondary | ICD-10-CM | POA: Diagnosis not present

## 2017-12-04 DIAGNOSIS — H93293 Other abnormal auditory perceptions, bilateral: Secondary | ICD-10-CM

## 2017-12-04 DIAGNOSIS — H539 Unspecified visual disturbance: Secondary | ICD-10-CM

## 2017-12-04 DIAGNOSIS — E876 Hypokalemia: Secondary | ICD-10-CM

## 2017-12-09 ENCOUNTER — Other Ambulatory Visit: Payer: BLUE CROSS/BLUE SHIELD | Admitting: *Deleted

## 2017-12-09 DIAGNOSIS — R42 Dizziness and giddiness: Secondary | ICD-10-CM

## 2017-12-09 DIAGNOSIS — R55 Syncope and collapse: Secondary | ICD-10-CM | POA: Diagnosis not present

## 2017-12-09 DIAGNOSIS — G90A Postural orthostatic tachycardia syndrome (POTS): Secondary | ICD-10-CM

## 2017-12-09 DIAGNOSIS — I951 Orthostatic hypotension: Secondary | ICD-10-CM

## 2017-12-09 DIAGNOSIS — R002 Palpitations: Secondary | ICD-10-CM

## 2017-12-09 DIAGNOSIS — I498 Other specified cardiac arrhythmias: Secondary | ICD-10-CM

## 2017-12-09 DIAGNOSIS — R000 Tachycardia, unspecified: Secondary | ICD-10-CM

## 2017-12-09 DIAGNOSIS — G909 Disorder of the autonomic nervous system, unspecified: Secondary | ICD-10-CM

## 2017-12-10 LAB — BASIC METABOLIC PANEL
BUN/Creatinine Ratio: 18 (ref 9–23)
BUN: 13 mg/dL (ref 6–20)
CALCIUM: 8.7 mg/dL (ref 8.7–10.2)
CO2: 22 mmol/L (ref 20–29)
Chloride: 104 mmol/L (ref 96–106)
Creatinine, Ser: 0.71 mg/dL (ref 0.57–1.00)
GFR calc Af Amer: 128 mL/min/{1.73_m2} (ref >59)
GFR calc non Af Amer: 111 mL/min/{1.73_m2} (ref >59)
Glucose: 84 mg/dL (ref 65–99)
POTASSIUM: 3.4 mmol/L — AB (ref 3.5–5.2)
SODIUM: 142 mmol/L (ref 134–144)

## 2017-12-12 DIAGNOSIS — R000 Tachycardia, unspecified: Secondary | ICD-10-CM | POA: Diagnosis not present

## 2017-12-12 DIAGNOSIS — I951 Orthostatic hypotension: Secondary | ICD-10-CM | POA: Diagnosis not present

## 2017-12-12 DIAGNOSIS — H9203 Otalgia, bilateral: Secondary | ICD-10-CM | POA: Diagnosis not present

## 2017-12-12 DIAGNOSIS — F419 Anxiety disorder, unspecified: Secondary | ICD-10-CM | POA: Diagnosis not present

## 2017-12-19 ENCOUNTER — Encounter: Payer: Self-pay | Admitting: Neurology

## 2017-12-19 ENCOUNTER — Ambulatory Visit: Payer: BLUE CROSS/BLUE SHIELD | Admitting: Neurology

## 2017-12-19 ENCOUNTER — Other Ambulatory Visit: Payer: Self-pay

## 2017-12-19 DIAGNOSIS — F410 Panic disorder [episodic paroxysmal anxiety] without agoraphobia: Secondary | ICD-10-CM

## 2017-12-19 DIAGNOSIS — I951 Orthostatic hypotension: Secondary | ICD-10-CM

## 2017-12-19 DIAGNOSIS — I498 Other specified cardiac arrhythmias: Secondary | ICD-10-CM

## 2017-12-19 DIAGNOSIS — R000 Tachycardia, unspecified: Secondary | ICD-10-CM | POA: Diagnosis not present

## 2017-12-19 DIAGNOSIS — G43019 Migraine without aura, intractable, without status migrainosus: Secondary | ICD-10-CM

## 2017-12-19 DIAGNOSIS — G90A Postural orthostatic tachycardia syndrome (POTS): Secondary | ICD-10-CM

## 2017-12-19 HISTORY — DX: Panic disorder (episodic paroxysmal anxiety): F41.0

## 2017-12-19 HISTORY — DX: Migraine without aura, intractable, without status migrainosus: G43.019

## 2017-12-19 MED ORDER — VENLAFAXINE HCL ER 37.5 MG PO CP24
ORAL_CAPSULE | ORAL | 3 refills | Status: DC
Start: 2017-12-19 — End: 2017-12-30

## 2017-12-19 MED ORDER — FLUDROCORTISONE ACETATE 0.1 MG PO TABS: 0.1000 mg | ORAL_TABLET | Freq: Every day | ORAL | 3 refills | Status: DC

## 2017-12-19 NOTE — Addendum Note (Signed)
Addended by: York Spaniel on: 12/19/2017 06:01 PM   Modules accepted: Level of Service

## 2017-12-19 NOTE — Progress Notes (Signed)
Reason for visit: Photophobia, phonophobia  Referring physician: Dr. Elta Guadeloupe is a 37 y.o. female  History of present illness:  Ms. Nordell is a 37 year old right-handed white female with a history of migraine headache.  The patient was having severe headaches around 2010, MRI of the brain was done at that time and showed nonspecific minimal white matter changes that could be consistent with migraine.  The patient has had symptoms of exercise intolerance consistent with POTS since 2010, she was formally diagnosed with POTS in 2017.  The patient is on Florinef currently, but her blood pressures have elevated on this medication.  The patient has had some problems with photophobia and phonophobia off and on in the past, she has a long-standing history of motion sickness, but these issues have significantly worsened in July 2017.  The patient was taking care of her children, she turned her head quickly and then suddenly had onset of symptoms of photophobia, phonophobia, and she has had increased motion sickness since that time.  The patient indicates that rapid head movements and fluorescent lights may worsen her symptoms.  She has had a lot of anxiety issues since July 2019, she is now having panic attacks on a regular basis.  She claims that when her POTS has a flare, she will have increased nausea, she has irritable bowel syndrome symptoms with intermittent diarrhea and constipation.  She denies any problems controlling the bladder.  The patient had been on Zoloft, but this worsened her diarrhea and she was taken off of it.  The patient denies any numbness or weakness of the extremities, she denies any balance issues or falls.  She is sent to this office for an evaluation.  She does get headaches off and on, she has about 2 headaches a week on average, oftentimes in the occipital area of the head.  The headaches are not severe at this point.  Past Medical History:  Diagnosis Date  .  Hypotension   . Low blood pressure   . POTS (postural orthostatic tachycardia syndrome)   . Tachycardia     Past Surgical History:  Procedure Laterality Date  . APPENDECTOMY    . BREAST SURGERY    . CESAREAN SECTION N/A 08/19/2014   Procedure: CESAREAN SECTION;  Surgeon: Myna Hidalgo, DO;  Location: [facility];  Service: Obstetrics;  Laterality: N/A;  . DILATION AND EVACUATION N/A 11/24/2016   Procedure: DILATATION AND EVACUATION;  Surgeon: Kirkland Hun, MD;  Location: [facility];  Service: Gynecology;  Laterality: N/A;  . FOOT SURGERY  2011 2012  . KNEE SURGERY  2001  2002  . plantar fasciitis      Family History  Problem Relation Age of Onset  . Cancer Maternal Grandmother        leg  . Diabetes Maternal Grandmother   . Heart murmur Father     Social history:  reports that she has quit smoking. She smoked 0.00 packs per day for 0.00 years. She has never used smokeless tobacco. She reports that she drinks alcohol. She reports that she does not use drugs.  Medications:  Prior to Admission medications   Medication Sig Start Date End Date Taking? Authorizing Provider  ALPRAZolam (XANAX XR) 1 MG 24 hr tablet Take 1 mg by mouth daily.   Yes [provider]  ALPRAZolam Prudy Feeler) 0.5 MG tablet Take 0.5 mg by mouth at bedtime as needed for anxiety.   Yes [provider]  dicyclomine (BENTYL) 20 MG  tablet Take 20 mg by mouth every 6 (six) hours as needed for spasms.   Yes [provider]  fludrocortisone (FLORINEF) 0.1 MG tablet Take 1 tablet (0.1 mg total) by mouth 2 (two) times daily. 10/22/17  Yes Duke Salvia, MD  hydrOXYzine (VISTARIL) 25 MG capsule TK 1 C PO Q 8 H PRN 12/05/17  Yes [provider]  levonorgestrel-ethinyl estradiol (SEASONALE,INTROVALE,JOLESSA) 0.15-0.03 MG tablet Take 1 tablet by mouth daily.   Yes [provider]  meclizine (ANTIVERT) 25 MG tablet Take 25 mg by mouth 3 (three) times daily as needed for dizziness.   Yes  [provider]  ondansetron (ZOFRAN ODT) 8 MG disintegrating tablet Take 1 tablet (8 mg total) by mouth every 8 (eight) hours as needed for nausea or vomiting. 11/24/16  Yes Kirkland Hun, MD  zolpidem (AMBIEN) 10 MG tablet Take 10 mg by mouth at bedtime as needed for sleep.   Yes [provider]      Allergies  Allergen Reactions  . Caffeine Palpitations  . Codeine Itching  . Phenergan [Promethazine Hcl] Itching and Rash    ROS:  Out of a complete 14 system review of symptoms, the patient complains only of the following symptoms, and all other reviewed systems are negative.  Nausea Photophobia, phonophobia  Blood pressure (!) 146/99, pulse 85, resp. rate 16, height 5\' 9"  (1.753 m), weight 151 lb (68.5 kg), unknown if currently breastfeeding.  Physical Exam  General: The patient is alert and cooperative at the time of the examination.  Eyes: Pupils are equal, round, and reactive to light. Discs are flat bilaterally.  Neck: The neck is supple, no carotid bruits are noted.  Respiratory: The respiratory examination is clear.  Cardiovascular: The cardiovascular examination reveals a regular rate and rhythm, no obvious murmurs or rubs are noted.  Skin: Extremities are without significant edema.  Neurologic Exam  Mental status: The patient is alert and oriented x 3 at the time of the examination. The patient has apparent normal recent and remote memory, with an apparently normal attention span and concentration ability.  Cranial nerves: Facial symmetry is present. There is good sensation of the face to pinprick and soft touch bilaterally. The strength of the facial muscles and the muscles to head turning and shoulder shrug are normal bilaterally. Speech is well enunciated, no aphasia or dysarthria is noted. Extraocular movements are full. Visual fields are full. The tongue is midline, and the patient has symmetric elevation of the soft palate. No obvious hearing  deficits are noted.  Motor: The motor testing reveals 5 over 5 strength of all 4 extremities. Good symmetric motor tone is noted throughout.  Sensory: Sensory testing is intact to pinprick, soft touch, vibration sensation, and position sense on all 4 extremities. No evidence of extinction is noted.  Coordination: Cerebellar testing reveals good finger-nose-finger and heel-to-shin bilaterally.  Gait and station: Gait is normal. Tandem gait is normal. Romberg is negative. No drift is seen.  Reflexes: Deep tendon reflexes are symmetric and normal bilaterally. Toes are downgoing bilaterally.   Assessment/Plan:  1.  Photophobia, phonophobia  2.  Anxiety disorder, panic disorder  3.  Irritable bowel syndrome  4.  Migraine headache  5. POTS  The primary issue with this patient appears to be a severe underlying anxiety disorder that is likely causing a multitude of somatic symptoms.  The patient is on alprazolam which seems to help, she should continue this.  I will add Effexor which may be useful  in treating migraine headache, POTS, and depression and anxiety.  The patient will be placed on 37.5 mg daily for a week and then go to 75 mg a day, she will call for dose adjustments, she will likely need a very high dose to treat her symptoms.  The patient will follow-up in 3 months.  Marlan Palau MD 12/19/2017 11:52 AM  [facility] [address] [place], [place] [address]  Phone [phone] Fax [phone]

## 2017-12-19 NOTE — Patient Instructions (Signed)
We will start Effexor for your symptoms.

## 2017-12-30 ENCOUNTER — Other Ambulatory Visit: Payer: Self-pay | Admitting: Neurology

## 2017-12-30 MED ORDER — VENLAFAXINE HCL ER 150 MG PO CP24
150.0000 mg | ORAL_CAPSULE | Freq: Every day | ORAL | 3 refills | Status: DC
Start: 2017-12-30 — End: 2018-04-25

## 2018-02-13 ENCOUNTER — Ambulatory Visit: Payer: BLUE CROSS/BLUE SHIELD | Admitting: Internal Medicine

## 2018-02-20 DIAGNOSIS — E559 Vitamin D deficiency, unspecified: Secondary | ICD-10-CM | POA: Diagnosis not present

## 2018-02-20 DIAGNOSIS — F419 Anxiety disorder, unspecified: Secondary | ICD-10-CM | POA: Diagnosis not present

## 2018-02-20 DIAGNOSIS — G901 Familial dysautonomia [Riley-Day]: Secondary | ICD-10-CM | POA: Diagnosis not present

## 2018-02-20 DIAGNOSIS — E538 Deficiency of other specified B group vitamins: Secondary | ICD-10-CM | POA: Diagnosis not present

## 2018-02-24 DIAGNOSIS — L988 Other specified disorders of the skin and subcutaneous tissue: Secondary | ICD-10-CM | POA: Diagnosis not present

## 2018-03-28 ENCOUNTER — Ambulatory Visit: Payer: BLUE CROSS/BLUE SHIELD | Admitting: Internal Medicine

## 2018-03-28 ENCOUNTER — Encounter: Payer: Self-pay | Admitting: Internal Medicine

## 2018-03-28 VITALS — BP 135/92 | HR 81 | Ht 69.0 in | Wt 145.0 lb

## 2018-03-28 DIAGNOSIS — R55 Syncope and collapse: Secondary | ICD-10-CM

## 2018-03-28 DIAGNOSIS — H93293 Other abnormal auditory perceptions, bilateral: Secondary | ICD-10-CM

## 2018-03-28 DIAGNOSIS — G90A Postural orthostatic tachycardia syndrome (POTS): Secondary | ICD-10-CM

## 2018-03-28 DIAGNOSIS — G909 Disorder of the autonomic nervous system, unspecified: Secondary | ICD-10-CM

## 2018-03-28 DIAGNOSIS — R000 Tachycardia, unspecified: Secondary | ICD-10-CM

## 2018-03-28 DIAGNOSIS — I951 Orthostatic hypotension: Secondary | ICD-10-CM

## 2018-03-28 DIAGNOSIS — H539 Unspecified visual disturbance: Secondary | ICD-10-CM

## 2018-03-28 DIAGNOSIS — I498 Other specified cardiac arrhythmias: Secondary | ICD-10-CM

## 2018-03-28 NOTE — Progress Notes (Signed)
Patient Care Team: Danie Binder as PCP - General (Physician Assistant) Cain Saupe, MD as Referring Physician (Family Medicine)   HPI  Mia Rubio is a 38 y.o. female Seen in follow-up POTS  Her dizziness in the past has been recumbent associated with normal vital signs which to me suggested a noncardiac perhaps neuropsychiatric origin. It was recommended that she be referred for consideration of these components. There also was a component of heat and orthostatic intolerance.  An abdominal binder helped considerably    She is here having lost 2 children miscarriages in the last 6 months.  The first was associated with profound and persistent bleeding and resulted in exercise intolerance bedrest and profound dizziness.  She recalls that when she had hormonal suppression of her periods she felt the best that she had in years.  She and her husband are continuing to try to have a second child.  Hence got pregnant again and she miscarried her second child about 6 months ago.  That is also the timeframe in which she began to have significantly worsened symptoms.  When seen 3/19, >>"She says she is barely able to sit up without being dizzy.  She is not able to stand well.  Fluorescent lights are bothersome.  She is unable to exercise  She is nauseated and has had significant decrease PO intake."  The symptoms have continued to worsen.    A second major event supervened about 7/19 >> she ended up with an episode of profound anxiety associated with light and sound hypersensitivity both of which have worsened since then.   This turned out to be temporally related to hormonal therapy for another attempted pregnancy  In the interim she has seen Dr. Anne Hahn who thought these may be related to migraines.  He has treated her with Effexor at rapidly increasing doses 37.5--150.  She has a sense that her anxiety and tremulousness is much much less.  She notes however also that there have  been disturbing nightmarish dreams, worsening insomnia, fatigue and night sweats.  No longer exercising since started on the Effexor    Past Medical History:  Diagnosis Date  . Common migraine with intractable migraine 12/19/2017  . Hypotension   . Low blood pressure   . Panic disorder 12/19/2017  . POTS (postural orthostatic tachycardia syndrome)   . Tachycardia     Past Surgical History:  Procedure Laterality Date  . APPENDECTOMY    . BREAST SURGERY    . CESAREAN SECTION N/A 08/19/2014   Procedure: CESAREAN SECTION;  Surgeon: Myna Hidalgo, DO;  Location: [facility];  Service: Obstetrics;  Laterality: N/A;  . DILATION AND EVACUATION N/A 11/24/2016   Procedure: DILATATION AND EVACUATION;  Surgeon: Kirkland Hun, MD;  Location: [facility];  Service: Gynecology;  Laterality: N/A;  . FOOT SURGERY  2011 2012  . KNEE SURGERY  2001  2002  . plantar fasciitis      Current Outpatient Medications  Medication Sig Dispense Refill  . ALPRAZolam (XANAX XR) 1 MG 24 hr tablet Take 1 mg by mouth daily.    Marland Kitchen ALPRAZolam (XANAX) 0.5 MG tablet Take 0.5 mg by mouth at bedtime as needed for anxiety.    . dicyclomine (BENTYL) 20 MG tablet Take 20 mg by mouth every 6 (six) hours as needed for spasms.    . fludrocortisone (FLORINEF) 0.1 MG tablet Take 1 tablet (0.1 mg total) by mouth daily. 180 tablet 3  . levonorgestrel-ethinyl estradiol (SEASONALE,INTROVALE,JOLESSA)  0.15-0.03 MG tablet Take 1 tablet by mouth daily.    . meclizine (ANTIVERT) 25 MG tablet Take 25 mg by mouth 3 (three) times daily as needed for dizziness.    . ondansetron (ZOFRAN ODT) 8 MG disintegrating tablet Take 1 tablet (8 mg total) by mouth every 8 (eight) hours as needed for nausea or vomiting. 30 tablet 0  . venlafaxine XR (EFFEXOR XR) 150 MG 24 hr capsule Take 1 capsule (150 mg total) by mouth daily with breakfast. 30 capsule 3  . zolpidem (AMBIEN) 10 MG tablet Take 10 mg by mouth at bedtime as needed for sleep.     No current  facility-administered medications for this visit.     Allergies  Allergen Reactions  . Caffeine Palpitations  . Codeine Itching  . Phenergan [Promethazine Hcl] Itching and Rash      Review of Systems negative except from HPI and PMH  Physical Exam BP (!) 135/92   Pulse 81   Ht 5\' 9"  (1.753 m)   Wt 145 lb (65.8 kg)   SpO2 99%   BMI 21.41 kg/m  Well developed and nourished in no acute distress HENT normal Neck supple with JVP-flat Clear Regular rate and rhythm, no murmurs or gallops Abd-soft with active BS No Clubbing cyanosis edema Skin-warm and dry A & Oriented  Grossly normal sensory and motor function   ECG sinus at 81 Intervals 14/09/37   Assessment and  Plan  Syncope  Orthostatic and heat intolerance  Postural Tachycardia  Elevated blood pressure  Miscarriages--multiple  Anxiety  She is better in some ways on the Effexor.  I worry that she is having side effects and have encouraged her to be proactive in the titration of her Effexor  She has significant orthostatic tachycardia today.  This is unassociated with hypotension and indeed with baseline modest elevation of blood pressure.  Is her impression that the Florinef is associated with less orthostatic intolerance; hence, we will continue it.  I wonder however if we could get away with little bit of a beta-blocker given the elevated blood pressure and profound tachycardia.  I suspect part of this is related to poor conditioning and I have stressed the importance of being non-horizontal as much as she can during the day and working on exercise again.  She has recumbent exercise bike at home   We have given her prescriptions for 3 beta-blockers, atenolol 50, metoprolol succinate 50 and bisoprolol 5 to try at half dose to see if she can can take without side effects and is so we can see if it attenuates her symptoms.  More than 50% of 40 min was spent in counseling related to the above        More  than 50% of 75 min was spent in counseling related to the above

## 2018-03-28 NOTE — Patient Instructions (Signed)
Medication Instructions:  Your physician has recommended you make the following change in your medication:   You are being given three prescriptions for different beta blockers to try.  You may take these in any order. DO NOT TAKE MORE THAN ONE BETA BLOCKER AT A TIME. Choose one beta blocker to start with. If after two weeks, you feel the medication is working well for you, continue that current medication. If it does not work well for you, try another prescription for a beta blocker at that time. You may continue that current beta blocker at that time if it works well for you. If it does not, repeat these instructions for the third beta blocker.  When you find a beta blocker that works well for you, call the office and we will send in a prescription for you.               1. Atenolol 50mg  tablet             2. Metoprolol Succ 50mg  tablet             3. Bisoprolol 5mg  tablet  You may take these in any order you please.   Please call us with any questions.  Labwork: None ordered.  Testing/Procedures: None ordered.  Follow-Up: Your physician recommends that you schedule a follow-up appointment in:   6 months with Dr Graciela Husbands  Any Other Special Instructions Will Be Listed Below (If Applicable).     If you need a refill on your cardiac medications before your next appointment, please call your pharmacy.

## 2018-04-04 NOTE — Progress Notes (Deleted)
[facility]  PATIENT: Mia Rubio DOB: 01/04/1981   REASON FOR VISIT: *** HISTORY FROM:    HISTORY OF PRESENT ILLNESS: 12/19/17 KWMs. Mia Rubio is a 38 year old right-handed white female with a history of migraine headache.  The patient was having severe headaches around 2010, MRI of the brain was done at that time and showed nonspecific minimal white matter changes that could be consistent with migraine.  The patient has had symptoms of exercise intolerance consistent with POTS since 2010, she was formally diagnosed with POTS in 2017.  The patient is on Florinef currently, but her blood pressures have elevated on this medication.  The patient has had some problems with photophobia and phonophobia off and on in the past, she has a long-standing history of motion sickness, but these issues have significantly worsened in July 2017.  The patient was taking care of her children, she turned her head quickly and then suddenly had onset of symptoms of photophobia, phonophobia, and she has had increased motion sickness since that time.  The patient indicates that rapid head movements and fluorescent lights may worsen her symptoms.  She has had a lot of anxiety issues since July 2019, she is now having panic attacks on a regular basis.  She claims that when her POTS has a flare, she will have increased nausea, she has irritable bowel syndrome symptoms with intermittent diarrhea and constipation.  She denies any problems controlling the bladder.  The patient had been on Zoloft, but this worsened her diarrhea and she was taken off of it.  The patient denies any numbness or weakness of the extremities, she denies any balance issues or falls.  She is sent to this office for an evaluation.  She does get headaches off and on, she has about 2 headaches a week on average, oftentimes in the occipital area of the head.  The headaches are not severe at this point.   REVIEW OF SYSTEMS: Full 14  system review of systems performed and notable only for those listed, all others are neg:  Constitutional: neg  Cardiovascular: neg Ear/Nose/Throat: neg  Skin: neg Eyes: neg Respiratory: neg Gastroitestinal: neg  Hematology/Lymphatic: neg  Endocrine: neg Musculoskeletal:neg Allergy/Immunology: neg Neurological: neg Psychiatric: neg Sleep : neg   ALLERGIES: Allergies  Allergen Reactions  . Caffeine Palpitations  . Codeine Itching  . Phenergan [Promethazine Hcl] Itching and Rash    HOME MEDICATIONS: Outpatient Medications Prior to Visit  Medication Sig Dispense Refill  . ALPRAZolam (XANAX XR) 1 MG 24 hr tablet Take 1 mg by mouth daily.    Marland Kitchen. ALPRAZolam (XANAX) 0.5 MG tablet Take 0.5 mg by mouth at bedtime as needed for anxiety.    . dicyclomine (BENTYL) 20 MG tablet Take 20 mg by mouth every 6 (six) hours as needed for spasms.    . fludrocortisone (FLORINEF) 0.1 MG tablet Take 1 tablet (0.1 mg total) by mouth daily. 180 tablet 3  . levonorgestrel-ethinyl estradiol (SEASONALE,INTROVALE,JOLESSA) 0.15-0.03 MG tablet Take 1 tablet by mouth daily.    . meclizine (ANTIVERT) 25 MG tablet Take 25 mg by mouth 3 (three) times daily as needed for dizziness.    . ondansetron (ZOFRAN ODT) 8 MG disintegrating tablet Take 1 tablet (8 mg total) by mouth every 8 (eight) hours as needed for nausea or vomiting. 30 tablet 0  . venlafaxine XR (EFFEXOR XR) 150 MG 24 hr capsule Take 1 capsule (150 mg total) by mouth daily with breakfast. 30 capsule 3  . zolpidem (AMBIEN)  10 MG tablet Take 10 mg by mouth at bedtime as needed for sleep.     No facility-administered medications prior to visit.     PAST MEDICAL HISTORY: Past Medical History:  Diagnosis Date  . Common migraine with intractable migraine 12/19/2017  . Hypotension   . Low blood pressure   . Panic disorder 12/19/2017  . POTS (postural orthostatic tachycardia syndrome)   . Tachycardia     PAST SURGICAL HISTORY: Past Surgical  History:  Procedure Laterality Date  . APPENDECTOMY    . BREAST SURGERY    . CESAREAN SECTION N/A 08/19/2014   Procedure: CESAREAN SECTION;  Surgeon: Myna Hidalgo, DO;  Location: [facility];  Service: Obstetrics;  Laterality: N/A;  . DILATION AND EVACUATION N/A 11/24/2016   Procedure: DILATATION AND EVACUATION;  Surgeon: Kirkland Hun, MD;  Location: [facility];  Service: Gynecology;  Laterality: N/A;  . FOOT SURGERY  2011 2012  . KNEE SURGERY  2001  2002  . plantar fasciitis      FAMILY HISTORY: Family History  Problem Relation Age of Onset  . Cancer Maternal Grandmother        leg  . Diabetes Maternal Grandmother   . Heart murmur Father     SOCIAL HISTORY: Social History   Socioeconomic History  . Marital status: Married    Spouse name: Not on file  . Number of children: Not on file  . Years of education: Not on file  . Highest education level: Not on file  Occupational History  . Not on file  Social Needs  . Financial resource strain: Not on file  . Food insecurity:    Worry: Not on file    Inability: Not on file  . Transportation needs:    Medical: Not on file    Non-medical: Not on file  Tobacco Use  . Smoking status: Former Smoker    Packs/day: 0.00    Years: 0.00    Pack years: 0.00  . Smokeless tobacco: Never Used  . Tobacco comment: only when drinks  Substance and Sexual Activity  . Alcohol use: Yes    Comment: rare  . Drug use: No  . Sexual activity: Not on file  Lifestyle  . Physical activity:    Days per week: Not on file    Minutes per session: Not on file  . Stress: Not on file  Relationships  . Social connections:    Talks on phone: Not on file    Gets together: Not on file    Attends religious service: Not on file    Active member of club or organization: Not on file    Attends meetings of clubs or organizations: Not on file    Relationship status: Not on file  . Intimate partner violence:    Fear of current or ex partner: Not on file     Emotionally abused: Not on file    Physically abused: Not on file    Forced sexual activity: Not on file  Other Topics Concern  . Not on file  Social History Narrative  . Not on file     PHYSICAL EXAM  There were no vitals filed for this visit. There is no height or weight on file to calculate BMI.  Generalized: Well developed, in no acute distress  Head: normocephalic and atraumatic,. Oropharynx benign  Neck: Supple, no carotid bruits  Cardiac: Regular rate rhythm, no murmur  Musculoskeletal: No deformity   Neurological examination   Mentation: Alert oriented  to time, place, history taking. Attention span and concentration appropriate. Recent and remote memory intact.  Follows all commands speech and language fluent.   Cranial nerve II-XII: Fundoscopic exam reveals sharp disc margins.Pupils were equal round reactive to light extraocular movements were full, visual field were full on confrontational test. Facial sensation and strength were normal. hearing was intact to finger rubbing bilaterally. Uvula tongue midline. head turning and shoulder shrug were normal and symmetric.Tongue protrusion into cheek strength was normal. Motor: normal bulk and tone, full strength in the BUE, BLE, fine finger movements normal, no pronator drift. No focal weakness Sensory: normal and symmetric to light touch, pinprick, and  Vibration, proprioception  Coordination: finger-nose-finger, heel-to-shin bilaterally, no dysmetria Reflexes: Brachioradialis 2/2, biceps 2/2, triceps 2/2, patellar 2/2, Achilles 2/2, plantar responses were flexor bilaterally. Gait and Station: Rising up from seated position without assistance, normal stance,  moderate stride, good arm swing, smooth turning, able to perform tiptoe, and heel walking without difficulty. Tandem gait is steady  DIAGNOSTIC DATA (LABS, IMAGING, TESTING) - I reviewed patient records, labs, notes, testing and imaging myself where available.  Lab  Results  Component Value Date   WBC 8.4 10/09/2017   HGB 13.7 10/09/2017   HCT 40.0 10/09/2017   MCV 89.1 10/09/2017   PLT 202 10/09/2017      Component Value Date/Time   NA 142 12/09/2017 1538   NA 140 06/26/2013 1551   K 3.4 (L) 12/09/2017 1538   K 3.5 06/26/2013 1551   CL 104 12/09/2017 1538   CO2 22 12/09/2017 1538   CO2 21 (L) 06/26/2013 1551   GLUCOSE 84 12/09/2017 1538   GLUCOSE 91 10/09/2017 2343   GLUCOSE 116 06/26/2013 1551   BUN 13 12/09/2017 1538   BUN 17.6 06/26/2013 1551   CREATININE 0.71 12/09/2017 1538   CREATININE 0.8 06/26/2013 1551   CALCIUM 8.7 12/09/2017 1538   CALCIUM 10.6 (H) 06/26/2013 1551   PROT 5.6 (L) 07/16/2014 0003   PROT 7.9 06/26/2013 1551   ALBUMIN 2.9 (L) 07/16/2014 0003   ALBUMIN 4.5 06/26/2013 1551   AST 22 07/16/2014 0003   AST 15 06/26/2013 1551   ALT 12 (L) 07/16/2014 0003   ALT <6 06/26/2013 1551   ALKPHOS 130 (H) 07/16/2014 0003   ALKPHOS 46 06/26/2013 1551   BILITOT 0.3 07/16/2014 0003   BILITOT 0.29 06/26/2013 1551   GFRNONAA 111 12/09/2017 1538   GFRAA 128 12/09/2017 1538    Lab Results  Component Value Date   TSH 1.610 11/06/2017      ASSESSMENT AND PLAN  38 y.o. year old female  has a past medical history of Common migraine with intractable migraine (12/19/2017), Hypotension, Low blood pressure, Panic disorder (12/19/2017), POTS (postural orthostatic tachycardia syndrome), and Tachycardia. here with *** Photophobia, phonophobia  2.  Anxiety disorder, panic disorder  3.  Irritable bowel syndrome  4.  Migraine headache  5. POTS  The primary issue with this patient appears to be a severe underlying anxiety disorder that is likely causing a multitude of somatic symptoms.  The patient is on alprazolam which seems to help, she should continue this.  I will add Effexor which may be useful in treating migraine headache, POTS, and depression and anxiety.  The patient will be placed on 37.5 mg daily for a week and  then go to 75 mg a day, she will call for dose adjustments, she will likely need a very high dose to treat her symptoms.  The patient will follow-up  in 3 months.   Nilda Riggs, [facility], [facility], APRN  [facility] [address] [place], [place] [address] [phone]

## 2018-04-07 ENCOUNTER — Ambulatory Visit: Payer: BLUE CROSS/BLUE SHIELD | Admitting: Nurse Practitioner

## 2018-04-08 ENCOUNTER — Encounter: Payer: Self-pay | Admitting: Nurse Practitioner

## 2018-04-09 MED ORDER — ATENOLOL 50 MG PO TABS: 50.0000 mg | ORAL_TABLET | Freq: Every day | ORAL | 3 refills | Status: DC

## 2018-04-09 NOTE — Telephone Encounter (Signed)
Pt has decided to stay on Atenolol, 50mg  tablet, once daily for her beta blocker therapy.   A 90 day refill has been sent to pt's pharmacy.

## 2018-04-25 ENCOUNTER — Other Ambulatory Visit: Payer: Self-pay | Admitting: Neurology

## 2018-08-28 ENCOUNTER — Other Ambulatory Visit: Payer: Self-pay | Admitting: Neurology

## 2018-09-24 ENCOUNTER — Other Ambulatory Visit: Payer: Self-pay | Admitting: Neurology

## 2018-10-27 ENCOUNTER — Other Ambulatory Visit: Payer: Self-pay | Admitting: Neurology

## 2018-11-08 DIAGNOSIS — Z87891 Personal history of nicotine dependence: Secondary | ICD-10-CM | POA: Diagnosis not present

## 2018-11-08 DIAGNOSIS — W57XXXA Bitten or stung by nonvenomous insect and other nonvenomous arthropods, initial encounter: Secondary | ICD-10-CM | POA: Diagnosis not present

## 2018-11-08 DIAGNOSIS — S90562A Insect bite (nonvenomous), left ankle, initial encounter: Secondary | ICD-10-CM | POA: Diagnosis not present

## 2018-11-27 ENCOUNTER — Other Ambulatory Visit: Payer: Self-pay | Admitting: Neurology

## 2019-04-17 ENCOUNTER — Other Ambulatory Visit: Payer: Self-pay | Admitting: Internal Medicine

## 2019-04-23 ENCOUNTER — Encounter: Payer: Self-pay | Admitting: Internal Medicine

## 2019-05-08 ENCOUNTER — Ambulatory Visit: Payer: BLUE CROSS/BLUE SHIELD | Admitting: Physician Assistant

## 2019-05-20 NOTE — Progress Notes (Signed)
Cardiology Office Note Date:  05/21/2019  Patient ID:  Mia Rubio, DOB 02-20-1981, MRN [id number] PCP:  Roseanna Rainbow, PA-C  Cardiologist:  Dr. Graciela Husbands    Chief Complaint: annual visit  History of Present Illness: Mia Rubio is a 39 y.o. female with history of POTS, anxiety, migraines  She comes in today to be seen for Dr. Graciela Husbands, last seen by him Jan 2020.  He recaps her history  As below:  "Her dizziness in the past has been recumbent associated with normal vital signs which to me suggested a noncardiac perhaps neuropsychiatric origin. It was recommended that she be referred for consideration of these components. There also was a component of heat and orthostatic intolerance.  An abdominal binder helped considerably    She is here having lost 2 children miscarriages in the last 6 months.  The first was associated with profound and persistent bleeding and resulted in exercise intolerance bedrest and profound dizziness.  She recalls that when she had hormonal suppression of her periods she felt the best that she had in years.  She and her husband are continuing to try to have a second child.  Hence got pregnant again and she miscarried her second child about 6 months ago.  That is also the timeframe in which she began to have significantly worsened symptoms.  When seen 3/19, >>"She says she is barely able to sit up without being dizzy.  She is not able to stand well.  Fluorescent lights are bothersome.  She is unable to exercise  She is nauseated and has had significant decrease PO intake."  The symptoms have continued to worsen.    A second major event supervened about 7/19 >> she ended up with an episode of profound anxiety associated with light and sound hypersensitivity both of which have worsened since then.   This turned out to be temporally related to hormonal therapy for another attempted pregnancy  In the interim she has seen Dr. Anne Hahn who thought these may be  related to migraines.  He has treated her with Effexor at rapidly increasing doses 37.5--150.  She has a sense that her anxiety and tremulousness is much much less.  She notes however also that there have been disturbing nightmarish dreams, worsening insomnia, fatigue and night sweats.  No longer exercising since started on the Effexor"   At this visit she had marked tachycardia with standing, her BP OK (modest elevation actually), and planned to try addition of BB (he gave her Rx for atenolol 50, metoprolol succinate 50 and bisoprolol 5 to try at half dose to see if she can can take without side effects and minimized her symptoms.  He also urged her to spend as little time in bed (horizontal) as possible and try to start to exercise with her recumbent bike.   She has done the best she has ever done in the last year.  The addition of the atenolol made a tremendous improvement.  She takes it at night to avoid worsening day time fatigu.  Unfortunately she had an insurance change that required her to change PMD and this has cause much stress and anxiety.  She says the new PMD would not refill her medicines and required her to see a physiatrist first, also feel like was mislabeled with a number of anxiety disorders that she has never been felt to have before.  She did give her a month refills on her Xanax, has been unable to get an appointment  with a psychiatrist and the new PMD has stopped returning her calls. She feels like she is losing ground on all the improvements she had made.  She has not had fainting but feels like she has started to feel her tachycardia more often particularly when bending/reaching. NOt as bad, but worries that this is a trend backwards.  Luckily she now has medicaid and can see her prior PMD for one last visit prior to the doctor leaving that practice.  A few weeks or more ago she had central CP that her husband brings up.  They called 911 and they told her her EKG vitals  were OK.  In hindsight, she had eaten rand fallen asleep and when she started belching the pain went away and feels it was GI.  Has not happened again.  Her orthostatics today look good, she felt a little lightheaded with position change, though no marked change in BP or HR  She has a family member with MS and her neurologist suggested CBD oil, she asks if we know if this may be helpful for POTS.  Particularly the times of increased worry/stress/ perhaps settle her nerves without the need for prescription medicines.  Florinef made her BP quite high and she has not used this in a year, compression garments give her the sensation of being overheated and were counter productive having tried them all.  She is not using her recumbent bike, spending a lot of time sewing, making masks for some extra income.  Spends much of her day seated.  Past Medical History:  Diagnosis Date  . Common migraine with intractable migraine 12/19/2017  . Hypotension   . Low blood pressure   . Panic disorder 12/19/2017  . POTS (postural orthostatic tachycardia syndrome)   . Tachycardia     Past Surgical History:  Procedure Laterality Date  . APPENDECTOMY    . BREAST SURGERY    . CESAREAN SECTION N/A 08/19/2014   Procedure: CESAREAN SECTION;  Surgeon: Myna Hidalgo, DO;  Location: [facility];  Service: Obstetrics;  Laterality: N/A;  . DILATION AND EVACUATION N/A 11/24/2016   Procedure: DILATATION AND EVACUATION;  Surgeon: Kirkland Hun, MD;  Location: [facility];  Service: Gynecology;  Laterality: N/A;  . FOOT SURGERY  2011 2012  . KNEE SURGERY  2001  2002  . plantar fasciitis      Current Outpatient Medications  Medication Sig Dispense Refill  . ALPRAZolam (XANAX XR) 1 MG 24 hr tablet Take 1 mg by mouth daily.    Marland Kitchen ALPRAZolam (XANAX) 0.5 MG tablet Take 0.5 mg by mouth at bedtime as needed for anxiety.    Marland Kitchen atenolol (TENORMIN) 50 MG tablet TAKE 1 TABLET(50 MG) BY MOUTH DAILY 30 tablet 0  . fludrocortisone  (FLORINEF) 0.1 MG tablet Take 1 tablet (0.1 mg total) by mouth daily. 180 tablet 3  . levonorgestrel-ethinyl estradiol (SEASONALE,INTROVALE,JOLESSA) 0.15-0.03 MG tablet Take 1 tablet by mouth daily.    . meclizine (ANTIVERT) 25 MG tablet Take 25 mg by mouth 3 (three) times daily as needed for dizziness.    . ondansetron (ZOFRAN ODT) 8 MG disintegrating tablet Take 1 tablet (8 mg total) by mouth every 8 (eight) hours as needed for nausea or vomiting. 30 tablet 0  . venlafaxine XR (EFFEXOR-XR) 150 MG 24 hr capsule TAKE 1 CAPSULE(150 MG) BY MOUTH DAILY WITH BREAKFAST 30 capsule 0  . zolpidem (AMBIEN) 10 MG tablet Take 10 mg by mouth at bedtime as needed for sleep.    Marland Kitchen  Vitamin D, Ergocalciferol, (DRISDOL) 1.25 MG (50000 UNIT) CAPS capsule Take 50,000 Units by mouth once a week.     No current facility-administered medications for this visit.    Allergies:   Caffeine, Codeine, and Phenergan [promethazine hcl]   Social History:  The patient  reports that she has quit smoking. She smoked 0.00 packs per day for 0.00 years. She has never used smokeless tobacco. She reports current alcohol use. She reports that she does not use drugs.   Family History:  The patient's family history includes Cancer in her maternal grandmother; Diabetes in her maternal grandmother; Heart murmur in her father.  ROS:  Please see the history of present illness.   All other systems are reviewed and otherwise negative.   PHYSICAL EXAM:  VS:  BP 124/72   Pulse 74   Ht 5\' 9"  (1.753 m)   Wt 169 lb (76.7 kg)   SpO2 98%   BMI 24.96 kg/m  BMI: Body mass index is 24.96 kg/m. Well nourished, well developed, in no acute distress  HEENT: normocephalic, atraumatic  Neck: no JVD, carotid bruits or masses Cardiac: RRR; no significant murmurs, no rubs, or gallops Lungs:  CTA b/l, no wheezing, rhonchi or rales  Abd: soft, nontender MS: no deformity or atrophy Ext: no edema  Skin: warm and dry, no rash Neuro:  No gross  deficits appreciated Psych: euthymic mood, full affect   EKG:  Done today and reviewed by myself shows  SR 66bpm, normal EKG     Recent Labs: No results found for requested labs within last 8760 hours.  No results found for requested labs within last 8760 hours.   CrCl cannot be calculated (Patient's most recent lab result is older than the maximum 21 days allowed.).   Wt Readings from Last 3 Encounters:  05/21/19 169 lb (76.7 kg)  03/28/18 145 lb (65.8 kg)  12/19/17 151 lb (68.5 kg)     Other studies reviewed: Additional studies/records reviewed today include: summarized above  ASSESSMENT AND PLAN:  1. POTS      She seems to have made good gains with the current regime and BB addition. I am not comfortable refilling her Effexor, xanax should she get gap in PMD.  She is recommended to discuss with her PMD to refer her to someon in the same clinic perhaps that can take over her care without having to establish a whole new chart/history and perhaps sign off to a partner prior to her leaving,  This may help a smoother transition.  She is going to try a PRN extra 1/2 tab (25mg ) of atenolol at night when she feels like her tachycardia is worse.  She asked if I would inquire with Dr. Caryl Comes regarding filling in for her Effexor and xanax in the future if needed as well as his thoughts on using CBD oil.  iw ill send him a note.  She mentions that historically she has tended to have low Potassium and asks if we can check on that for her, will get a BMEt today  WIll have her see him in 27mo, sooner if needed   Current medicines are reviewed at length with the patient today.  The patient did not have any concerns regarding medicines.  Venetia Night, PA-C 05/21/2019 3:47 PM     [address] [place] [address] [phone] (office)  [phone] (fax)

## 2019-05-21 ENCOUNTER — Other Ambulatory Visit: Payer: Self-pay

## 2019-05-21 ENCOUNTER — Ambulatory Visit: Payer: BLUE CROSS/BLUE SHIELD | Admitting: Physician Assistant

## 2019-05-21 ENCOUNTER — Telehealth: Payer: Self-pay | Admitting: Physician Assistant

## 2019-05-21 VITALS — BP 124/72 | HR 74 | Ht 69.0 in | Wt 169.0 lb

## 2019-05-21 DIAGNOSIS — R002 Palpitations: Secondary | ICD-10-CM

## 2019-05-21 DIAGNOSIS — E876 Hypokalemia: Secondary | ICD-10-CM | POA: Diagnosis not present

## 2019-05-21 DIAGNOSIS — I498 Other specified cardiac arrhythmias: Secondary | ICD-10-CM | POA: Diagnosis not present

## 2019-05-21 DIAGNOSIS — G90A Postural orthostatic tachycardia syndrome (POTS): Secondary | ICD-10-CM

## 2019-05-21 MED ORDER — ATENOLOL 50 MG PO TABS: ORAL_TABLET | ORAL | 1 refills | Status: DC

## 2019-05-21 NOTE — Telephone Encounter (Signed)
Mia Rubio is calling requesting her Husband Francis Dowse attend her appointment with her today 05/21/19 due to her having POTS causing her to easily get dizzy and faint. Please advise/

## 2019-05-21 NOTE — Patient Instructions (Addendum)
Medication Instructions:   Your physician recommends that you continue on your current medications as directed. Please refer to the Current Medication list given to you today.  OKAY TAKE AN EXTRA 1/2 TABLET  OF ATENOLOL FOR PALPITATIONS   *If you need a refill on your cardiac medications before your next appointment, please call your pharmacy*  Lab Work: BMET  TODAY   If you have labs (blood work) drawn today and your tests are completely normal, you will receive your results only by: Marland Kitchen MyChart Message (if you have MyChart) OR . A paper copy in the mail If you have any lab test that is abnormal or we need to change your treatment, we will call you to review the results.   Testing/Procedures:  NONE ORDERED  TODAY  Follow-Up: At [facility], you and your health needs are our priority.  As part of our continuing mission to provide you with exceptional heart care, we have created designated Provider Care Teams.  These Care Teams include your primary Cardiologist (physician) and Advanced Practice Providers (APPs -  Physician Assistants and Nurse Practitioners) who all work together to provide you with the care you need, when you need it.  We recommend signing up for the patient portal called "MyChart".  Sign up information is provided on this After Visit Summary.  MyChart is used to connect with patients for Virtual Visits (Telemedicine).  Patients are able to view lab/test results, encounter notes, upcoming appointments, etc.  Non-urgent messages can be sent to your provider as well.   To learn more about what you can do with MyChart, go to [email or web address].    Your next appointment:    3 month(s)  The format for your next appointment:   In Person  Provider:   You may see  or one of the following Advanced Practice Providers on your designated Care Team:    Gypsy Balsam, NP  Francis Dowse, PA-C  Casimiro Needle "Otilio Saber, [place]    Other Instructions

## 2019-05-22 LAB — BASIC METABOLIC PANEL
BUN/Creatinine Ratio: 14 (ref 9–23)
BUN: 11 mg/dL (ref 6–20)
CO2: 21 mmol/L (ref 20–29)
Calcium: 9.4 mg/dL (ref 8.7–10.2)
Chloride: 106 mmol/L (ref 96–106)
Creatinine, Ser: 0.81 mg/dL (ref 0.57–1.00)
GFR calc Af Amer: 107 mL/min/{1.73_m2} (ref >59)
GFR calc non Af Amer: 93 mL/min/{1.73_m2} (ref >59)
Glucose: 96 mg/dL (ref 65–99)
Potassium: 4.3 mmol/L (ref 3.5–5.2)
Sodium: 142 mmol/L (ref 134–144)

## 2019-09-01 ENCOUNTER — Other Ambulatory Visit: Payer: Self-pay

## 2019-09-01 ENCOUNTER — Ambulatory Visit (INDEPENDENT_AMBULATORY_CARE_PROVIDER_SITE_OTHER): Payer: BLUE CROSS/BLUE SHIELD | Admitting: Internal Medicine

## 2019-09-01 ENCOUNTER — Encounter: Payer: Self-pay | Admitting: Internal Medicine

## 2019-09-01 VITALS — BP 132/84 | HR 75 | Ht 69.0 in | Wt 170.0 lb

## 2019-09-01 DIAGNOSIS — G90A Postural orthostatic tachycardia syndrome (POTS): Secondary | ICD-10-CM

## 2019-09-01 DIAGNOSIS — I498 Other specified cardiac arrhythmias: Secondary | ICD-10-CM | POA: Diagnosis not present

## 2019-09-01 NOTE — Patient Instructions (Signed)
Medication Instructions:  Your physician recommends that you continue on your current medications as directed. Please refer to the Current Medication list given to you today.  *If you need a refill on your cardiac medications before your next appointment, please call your pharmacy*   Lab Work: None ordered.  If you have labs (blood work) drawn today and your tests are completely normal, you will receive your results only by: Marland Kitchen MyChart Message (if you have MyChart) OR . A paper copy in the mail If you have any lab test that is abnormal or we need to change your treatment, we will call you to review the results.   Testing/Procedures: None ordered.    Follow-Up: At [facility], you and your health needs are our priority.  As part of our continuing mission to provide you with exceptional heart care, we have created designated Provider Care Teams.  These Care Teams include your primary Cardiologist (physician) and Advanced Practice Providers (APPs -  Physician Assistants and Nurse Practitioners) who all work together to provide you with the care you need, when you need it.  We recommend signing up for the patient portal called "MyChart".  Sign up information is provided on this After Visit Summary.  MyChart is used to connect with patients for Virtual Visits (Telemedicine).  Patients are able to view lab/test results, encounter notes, upcoming appointments, etc.  Non-urgent messages can be sent to your provider as well.   To learn more about what you can do with MyChart, go to [email or web address].    Your next appointment:   6 months with Dr Graciela Husbands.  You will receive a letter in the mail to remind you to schedule.

## 2019-09-01 NOTE — Progress Notes (Signed)
Patient Care Team: Roseanna Rainbow, PA-C (Inactive) as PCP - General (Physician Assistant) Cain Saupe, MD as Referring Physician (Family Medicine)   HPI  Mia Rubio is a 39 y.o. female Seen in follow-up POTS  Her dizziness in the past has been recumbent associated with normal vital signs which to me suggested a noncardiac perhaps neuropsychiatric origin. It was recommended that she be referred for consideration of these components. There also was a component of heat and orthostatic intolerance.  An abdominal binder helped considerably    She is here having lost 2 children miscarriages in the last 6 months.  The first was associated with profound and persistent bleeding and resulted in exercise intolerance bedrest and profound dizziness.  She recalls that when she had hormonal suppression of her periods she felt the best that she had in years.  She and her husband are continuing to try to have a second child.  Hence got pregnant again and she miscarried her second child about 6 months ago.  That is also the timeframe in which she began to have significantly worsened symptoms.  When seen 3/19, >>"She says she is barely able to sit up without being dizzy.  She is not able to stand well.  Fluorescent lights are bothersome.  She is unable to exercise  She is nauseated and has had significant decrease PO intake."  The symptoms have continued to worsen.    A second major event supervened about 7/19 >> she ended up with an episode of profound anxiety associated with light and sound hypersensitivity both of which have worsened since then.   This turned out to be temporally related to hormonal therapy for another attempted pregnancy  In the interim she has seen Dr. Anne Hahn who thought these may be related to migraines.  He has treated her with Effexor at rapidly increasing doses 37.5--150.  She has a sense that her anxiety and tremulousness is much much less.  She notes however also that  there have been disturbing nightmarish dreams, worsening insomnia, fatigue and night sweats.  No longer exercising since started on the Effexor   At her last visit we began her on atenolol.  This in conjunction with her antidepressants has changed her life.  She says she has not felt this good since she was 25.  She has been able to tolerate the heat.  Able to squat and stand.  Blood pressures at home have been in the 120s.  Date Cr K Hgb  3/21  0.81   4.3              Past Medical History:  Diagnosis Date  . Common migraine with intractable migraine 12/19/2017  . Hypotension   . Low blood pressure   . Panic disorder 12/19/2017  . POTS (postural orthostatic tachycardia syndrome)   . Tachycardia     Past Surgical History:  Procedure Laterality Date  . APPENDECTOMY    . BREAST SURGERY    . CESAREAN SECTION N/A 08/19/2014   Procedure: CESAREAN SECTION;  Surgeon: Myna Hidalgo, DO;  Location: [facility];  Service: Obstetrics;  Laterality: N/A;  . DILATION AND EVACUATION N/A 11/24/2016   Procedure: DILATATION AND EVACUATION;  Surgeon: Kirkland Hun, MD;  Location: [facility];  Service: Gynecology;  Laterality: N/A;  . FOOT SURGERY  2011 2012  . KNEE SURGERY  2001  2002  . plantar fasciitis      Current Outpatient Medications  Medication Sig Dispense Refill  .  ALPRAZolam (XANAX XR) 1 MG 24 hr tablet Take 1 mg by mouth daily.    Marland Kitchen ALPRAZolam (XANAX) 0.5 MG tablet Take 0.5 mg by mouth at bedtime as needed for anxiety.    Marland Kitchen atenolol (TENORMIN) 50 MG tablet Take 1.5 tablet (75 mg) by mouth once daily    . fludrocortisone (FLORINEF) 0.1 MG tablet Take 1 tablet (0.1 mg total) by mouth daily. 180 tablet 3  . levonorgestrel-ethinyl estradiol (SEASONALE,INTROVALE,JOLESSA) 0.15-0.03 MG tablet Take 1 tablet by mouth daily.    . meclizine (ANTIVERT) 25 MG tablet Take 25 mg by mouth 3 (three) times daily as needed for dizziness.    . ondansetron (ZOFRAN ODT) 8 MG disintegrating tablet Take 1  tablet (8 mg total) by mouth every 8 (eight) hours as needed for nausea or vomiting. 30 tablet 0  . venlafaxine XR (EFFEXOR-XR) 150 MG 24 hr capsule TAKE 1 CAPSULE(150 MG) BY MOUTH DAILY WITH BREAKFAST 30 capsule 0  . Vitamin D, Ergocalciferol, (DRISDOL) 1.25 MG (50000 UNIT) CAPS capsule Take 50,000 Units by mouth once a week.    . zolpidem (AMBIEN) 10 MG tablet Take 10 mg by mouth at bedtime as needed for sleep.     No current facility-administered medications for this visit.    Allergies  Allergen Reactions  . Caffeine Palpitations  . Codeine Itching  . Phenergan [Promethazine Hcl] Itching and Rash      Review of Systems negative except from HPI and PMH  Physical Exam BP 132/84   Pulse 75   Ht 5\' 9"  (1.753 m)   Wt 170 lb (77.1 kg)   SpO2 97%   BMI 25.10 kg/m  Well developed and nourished in no acute distress HENT normal Neck supple with JVP-flat Clear Regular rate and rhythm, no murmurs or gallops Abd-soft with active BS No Clubbing cyanosis edema Skin-warm and dry A & Oriented  Grossly normal sensory and motor function   ECG sinus at 75 14/08/38   Assessment and  Plan  Syncope  Orthostatic and heat intolerance  Postural Tachycardia  Elevated blood pressure  Miscarriages--multiple  Anxiety  Beta-blockers were successful in attenuating postural tachycardia.  Tolerating atenolol without difficulty.  Recent up titration-50--75  She says she is better than she has been more than a decade.  She is able to stand and squat.  She is able to tolerate the heat.  Her mood is exceedingly improved.  Volume repletion is good.  Started to exercise.  Blood pressures have been elevated on and off for the last 5 years here.  She thinks is primarily related to in office checks.  Blood pressures at home have been in the 120 range.  We will continue her Florinef at the current dose.  Last potassium levels were normal.

## 2019-09-04 ENCOUNTER — Telehealth: Payer: Self-pay | Admitting: Internal Medicine

## 2019-09-04 DIAGNOSIS — G909 Disorder of the autonomic nervous system, unspecified: Secondary | ICD-10-CM

## 2019-09-04 DIAGNOSIS — H539 Unspecified visual disturbance: Secondary | ICD-10-CM

## 2019-09-04 DIAGNOSIS — G90A Postural orthostatic tachycardia syndrome (POTS): Secondary | ICD-10-CM

## 2019-09-04 DIAGNOSIS — I498 Other specified cardiac arrhythmias: Secondary | ICD-10-CM

## 2019-09-04 NOTE — Telephone Encounter (Signed)
Patient states that Dr. Graciela Husbands wanted her to see a Neuro Ophthamologist. She wants to know if he will send a referral for her. Please advise.

## 2019-09-14 NOTE — Telephone Encounter (Signed)
I dont know of one--  It was just an idea  :))  Sorry

## 2019-09-17 NOTE — Telephone Encounter (Signed)
Attempted phone call to pt.  Left voicemail message to contact RN at [phone]. 

## 2019-09-23 NOTE — Telephone Encounter (Signed)
Follow Up:    Returning Mia Rubio's call from 09-17-19. Pt said she had an POTS episode that lasted 4 hours last night.

## 2019-09-23 NOTE — Telephone Encounter (Signed)
I spoke with patient. She states her optometrist has recommended she see Dr Theone Murdoch who is an ophthalmologist at [facility].  Optometrist wanted patient to check with Dr Graciela Husbands to see if he would like to make this referral as he initiated the conversation.  Patient reports optometrist said she would make referral if Dr Graciela Husbands preferred that. Patient reports she over exerted herself yesterday during the day but felt fine at the time. Last night while lying in bed she had episode where heart rate ranged from 60-188. BP 74/52.  Heart rate stayed around 160 for 4 hours.  She took Xanax and florinef. BP 83/66 after florinef.  She had numbness, tingling and palpitations during this time.  After about 4 hours she was able to go to sleep.  Today she is feeling more normal.  Heart rate is 78 and BP 111/71.

## 2019-09-24 NOTE — Telephone Encounter (Signed)
Spoke with pt and advised we will be glad to make Ophthalmology referral for pt as Dr Graciela Husbands recommended.  Pt reports she continues to feel better without symptoms. Pt advised if she has another lengthy episode such as this last with heart rates in the 180's for 4 hours pt should go to ED for further evaluation and especially if it is accompanied with any CP or SOB.  Referral order request placed for Dr Gentry Roch, Ophthalmology, [facility].  Pt verbalizes understanding and agrees with current plan.

## 2019-10-16 MED ORDER — ATENOLOL 50 MG PO TABS: 75.0000 mg | ORAL_TABLET | Freq: Every day | ORAL | 3 refills | Status: DC

## 2019-10-24 ENCOUNTER — Other Ambulatory Visit (HOSPITAL_COMMUNITY): Payer: Self-pay | Admitting: Physician Assistant

## 2019-10-24 ENCOUNTER — Telehealth (HOSPITAL_COMMUNITY): Payer: Self-pay | Admitting: Physician Assistant

## 2019-10-24 DIAGNOSIS — I498 Other specified cardiac arrhythmias: Secondary | ICD-10-CM

## 2019-10-24 DIAGNOSIS — G90A Postural orthostatic tachycardia syndrome (POTS): Secondary | ICD-10-CM

## 2019-10-24 DIAGNOSIS — Z6825 Body mass index (BMI) 25.0-25.9, adult: Secondary | ICD-10-CM

## 2019-10-24 DIAGNOSIS — U071 COVID-19: Secondary | ICD-10-CM

## 2019-10-24 NOTE — Progress Notes (Signed)
I connected by phone with Mia Rubio on 10/24/2019 at 1:28 PM to discuss the potential use of a new treatment for mild to moderate COVID-19 viral infection in non-hospitalized patients.  This patient is a 39 y.o. female that meets the FDA criteria for Emergency Use Authorization of COVID monoclonal antibody casirivimab/imdevimab.  Has a (+) direct SARS-CoV-2 viral test result  Has mild or moderate COVID-19   Is NOT hospitalized due to COVID-19  Is within 10 days of symptom onset  Has at least one of the high risk factor(s) for progression to severe COVID-19 and/or hospitalization as defined in EUA.  Specific high risk criteria : BMI > 25 and Immunosuppressive Disease or Treatment   I have spoken and communicated the following to the patient or parent/caregiver regarding COVID monoclonal antibody treatment:  1. FDA has authorized the emergency use for the treatment of mild to moderate COVID-19 in adults and pediatric patients with positive results of direct SARS-CoV-2 viral testing who are 29 years of age and older weighing at least 40 kg, and who are at high risk for progressing to severe COVID-19 and/or hospitalization.  2. The significant known and potential risks and benefits of COVID monoclonal antibody, and the extent to which such potential risks and benefits are unknown.  3. Information on available alternative treatments and the risks and benefits of those alternatives, including clinical trials.  4. Patients treated with COVID monoclonal antibody should continue to self-isolate and use infection control measures (e.g., wear mask, isolate, social distance, avoid sharing personal items, clean and disinfect "high touch" surfaces, and frequent handwashing) according to CDC guidelines.   5. The patient or parent/caregiver has the option to accept or refuse COVID monoclonal antibody treatment.  After reviewing this information with the patient, The patient agreed to proceed with  receiving casirivimab\imdevimab infusion and will be provided a copy of the Fact sheet prior to receiving the infusion.  Sx onset 8/25. Set up for infusion on 8/30 @ 5PM. Directions given to [facility]. Pt is aware that insurance will be charged an infusion fee. She was vaccinated with moderna.   Cline Crock 10/24/2019 1:28 PM

## 2019-10-24 NOTE — Telephone Encounter (Signed)
Called to discuss with patient about Covid symptoms and the use of casirivimab/imdevimab, a monoclonal antibody infusion for those with mild to moderate Covid symptoms and at a high risk of hospitalization.  Pt is qualified for this infusion at the [facility] infusion center due to; Specific high risk criteria : BMI > 25, POTS   Message left to call back our hotline [phone] and sent mychart message.   Cline Crock PA-C  [facility]

## 2019-10-26 ENCOUNTER — Ambulatory Visit (HOSPITAL_COMMUNITY)
Admission: RE | Admit: 2019-10-26 | Discharge: 2019-10-26 | Disposition: A | Discharge status: Home / Self Care | Payer: BLUE CROSS/BLUE SHIELD | Source: Ambulatory Visit | Attending: Pulmonary Disease | Admitting: Pulmonary Disease

## 2019-10-26 DIAGNOSIS — U071 COVID-19: Secondary | ICD-10-CM

## 2019-10-26 DIAGNOSIS — I498 Other specified cardiac arrhythmias: Secondary | ICD-10-CM | POA: Diagnosis not present

## 2019-10-26 DIAGNOSIS — Z6825 Body mass index (BMI) 25.0-25.9, adult: Secondary | ICD-10-CM

## 2019-10-26 DIAGNOSIS — G90A Postural orthostatic tachycardia syndrome (POTS): Secondary | ICD-10-CM

## 2019-10-26 MED ORDER — METHYLPREDNISOLONE SODIUM SUCC 125 MG IJ SOLR: 125.0000 mg | Freq: Once | INTRAMUSCULAR | Status: DC | PRN

## 2019-10-26 MED ORDER — SODIUM CHLORIDE 0.9 % IV SOLN: INTRAVENOUS | Status: DC | PRN

## 2019-10-26 MED ORDER — EPINEPHRINE 0.3 MG/0.3ML IJ SOAJ: 0.3000 mg | Freq: Once | INTRAMUSCULAR | Status: DC | PRN

## 2019-10-26 MED ORDER — FAMOTIDINE IN NACL 20-0.9 MG/50ML-% IV SOLN: 20.0000 mg | Freq: Once | INTRAVENOUS | Status: DC | PRN

## 2019-10-26 MED ORDER — SODIUM CHLORIDE 0.9 % IV SOLN
1200.0000 mg | Freq: Once | INTRAVENOUS | Status: AC
  Administered 2019-10-26: 1200 mg via INTRAVENOUS

## 2019-10-26 MED ORDER — ALBUTEROL SULFATE HFA 108 (90 BASE) MCG/ACT IN AERS: 2.0000 | INHALATION_SPRAY | Freq: Once | RESPIRATORY_TRACT | Status: DC | PRN

## 2019-10-26 MED ORDER — DIPHENHYDRAMINE HCL 50 MG/ML IJ SOLN: 50.0000 mg | Freq: Once | INTRAMUSCULAR | Status: DC | PRN

## 2019-10-26 NOTE — Discharge Instructions (Signed)

## 2019-10-26 NOTE — Progress Notes (Signed)
  Diagnosis: COVID-19  Physician: Dr. Wright  Procedure: Covid Infusion Clinic Med: casirivimab\imdevimab infusion - Provided patient with casirivimab\imdevimab fact sheet for patients, parents and caregivers prior to infusion.  Complications: No immediate complications noted.  Discharge: Discharged home   Michaila Kenney E Keyle Doby 10/26/2019   

## 2020-02-01 ENCOUNTER — Telehealth: Payer: Self-pay | Admitting: Neurology

## 2020-02-01 NOTE — Telephone Encounter (Signed)
Mia Rubio, this patient has been referred to me for headaches. Can you put her in a new patient spot this month for me? I'm sure we can find something.Referral from Gentry Roch, MD at [facility]. I know she saw Dr. Anne Hahn in the past, I am ok with that thanks

## 2020-02-15 ENCOUNTER — Telehealth: Payer: Self-pay | Admitting: Neurology

## 2020-02-15 NOTE — Telephone Encounter (Signed)
And ophthalmologic evaluation done on 27 January 2020 through Dr. Gentry Roch reveals a relatively normal neuro ophthalmologic evaluation.  She may have minimal dry eye syndrome.  The clinical history he believes suggests migraine related problems.  Dr. Daphine Deutscher recommended continued care with her neurologist.

## 2020-02-16 ENCOUNTER — Encounter: Payer: Self-pay | Admitting: Neurology

## 2020-02-16 ENCOUNTER — Ambulatory Visit: Payer: BLUE CROSS/BLUE SHIELD | Admitting: Neurology

## 2020-02-16 NOTE — Progress Notes (Deleted)
[facility]    Provider:  Dr Lucia Gaskins Requesting Provider: No ref. provider found Gentry Roch MD Primary Care Provider:  Roseanna Rainbow, PA-C (Inactive)  CC:  ***  HPI:  Mia Rubio is a 39 y.o. female here as requested by Gentry Roch for Headaches.  Past medical history migraine, POTS, panic disorder.  I reviewed Dr. Glennon Mac notes from January 27, 2020: Patient presented for neuro-ophthalmology consultation at the request of Dr. Graciela Husbands who treats her POTS, sent for visual disturbance and autoimmune disorder, visual changes are fine with glasses, but did she does note issues with three-dimensional perception, some objects in her vision seem to move further closer away, exacerbated with motion, no true diplopia, she has not been driving in several years because the depth perception problem seems to be an issue, she also describes light sensitivity in general and that lights can sometimes trigger migraine headaches or scintillating scotoma, the visual aura can occur a number of times a week, she has seen neurology and is on a trial of medications (I reviewed her chart and could not find any recent neurology notes, she has seen neurology remotely).  Examination showed right visual acuity 20/20 left 20/25 +2, small anisocoria with the right pupil larger than the left about the same in darkness and in light, no relative apparent pupillary defect, visual fields full, funduscopic exam showed normal discs.  She had a relatively normal ophthalmic examination, minimal evidence of dry eye syndrome, sent here for evaluation of migraines.  Patient has classic fortification spectra.  She may benefit from [facility] tinted in her glasses for photosensitivity.  Reviewed notes, labs and imaging from outside physicians, which showed ***  tsh 10/2017 normal, BMP unremarkable  Meds tried in the past that can be used in migraine management include: Tylenol, atenolol, Stadol, Benadryl,  Decadron injection, ibuprofen, ketorolac Toradol injection, meclizine, methylprednisolone, Reglan injections, Zofran oral and injections, oxycodone, prednisone oral tablets, Zoloft, scopolamine patches, tramadol, venlafaxine,  Review of Systems: Patient complains of symptoms per HPI as well as the following symptoms ***. Pertinent negatives and positives per HPI. All others negative.   Social History   Socioeconomic History  . Marital status: Married    Spouse name: Not on file  . Number of children: Not on file  . Years of education: Not on file  . Highest education level: Not on file  Occupational History  . Not on file  Tobacco Use  . Smoking status: Former Smoker    Packs/day: 0.00    Years: 0.00    Pack years: 0.00  . Smokeless tobacco: Never Used  . Tobacco comment: only when drinks  Vaping Use  . Vaping Use: Never used  Substance and Sexual Activity  . Alcohol use: Yes    Comment: rare  . Drug use: No  . Sexual activity: Not on file  Other Topics Concern  . Not on file  Social History Narrative  . Not on file   Social Determinants of Health   Financial Resource Strain: Not on file  Food Insecurity: Not on file  Transportation Needs: Not on file  Physical Activity: Not on file  Stress: Not on file  Social Connections: Not on file  Intimate Partner Violence: Not on file    Family History  Problem Relation Age of Onset  . Cancer Maternal Grandmother        leg  . Diabetes Maternal Grandmother   . Heart murmur Father     Past  Medical History:  Diagnosis Date  . Common migraine with intractable migraine 12/19/2017  . Hypotension   . Low blood pressure   . Panic disorder 12/19/2017  . POTS (postural orthostatic tachycardia syndrome)   . Tachycardia     Patient Active Problem List   Diagnosis Date Noted  . POTS (postural orthostatic tachycardia syndrome) 12/19/2017  . Panic disorder 12/19/2017  . Common migraine with intractable migraine 12/19/2017   . Missed abortion 11/24/2016  . Intrauterine normal pregnancy 08/19/2014    Past Surgical History:  Procedure Laterality Date  . APPENDECTOMY    . BREAST SURGERY    . CESAREAN SECTION N/A 08/19/2014   Procedure: CESAREAN SECTION;  Surgeon: Myna Hidalgo, DO;  Location: [facility];  Service: Obstetrics;  Laterality: N/A;  . DILATION AND EVACUATION N/A 11/24/2016   Procedure: DILATATION AND EVACUATION;  Surgeon: Kirkland Hun, MD;  Location: [facility];  Service: Gynecology;  Laterality: N/A;  . FOOT SURGERY  2011 2012  . KNEE SURGERY  2001  2002  . plantar fasciitis      Current Outpatient Medications  Medication Sig Dispense Refill  . ALPRAZolam (XANAX XR) 1 MG 24 hr tablet Take 1 mg by mouth daily.    Marland Kitchen ALPRAZolam (XANAX) 0.5 MG tablet Take 0.5 mg by mouth at bedtime as needed for anxiety.    Marland Kitchen atenolol (TENORMIN) 50 MG tablet Take 1.5 tablets (75 mg total) by mouth daily. Take 1.5 tablet (75 mg) by mouth once daily 135 tablet 3  . fludrocortisone (FLORINEF) 0.1 MG tablet Take 1 tablet (0.1 mg total) by mouth daily. 180 tablet 3  . levonorgestrel-ethinyl estradiol (SEASONALE,INTROVALE,JOLESSA) 0.15-0.03 MG tablet Take 1 tablet by mouth daily.    . meclizine (ANTIVERT) 25 MG tablet Take 25 mg by mouth 3 (three) times daily as needed for dizziness.    . ondansetron (ZOFRAN ODT) 8 MG disintegrating tablet Take 1 tablet (8 mg total) by mouth every 8 (eight) hours as needed for nausea or vomiting. 30 tablet 0  . venlafaxine XR (EFFEXOR-XR) 150 MG 24 hr capsule TAKE 1 CAPSULE(150 MG) BY MOUTH DAILY WITH BREAKFAST 30 capsule 0  . Vitamin D, Ergocalciferol, (DRISDOL) 1.25 MG (50000 UNIT) CAPS capsule Take 50,000 Units by mouth once a week.    . zolpidem (AMBIEN) 10 MG tablet Take 10 mg by mouth at bedtime as needed for sleep.     No current facility-administered medications for this visit.    Allergies as of 02/16/2020 - Review Complete 10/26/2019  Allergen Reaction Noted  . Caffeine  Palpitations 07/14/2015  . Codeine Itching 11/28/2011  . Phenergan [promethazine hcl] Itching and Rash 05/02/2013    Vitals: There were no vitals taken for this visit. Last Weight:  Wt Readings from Last 1 Encounters:  09/01/19 170 lb (77.1 kg)   Last Height:   Ht Readings from Last 1 Encounters:  09/01/19 5\' 9"  (1.753 m)     Physical exam: Exam: Gen: NAD, conversant, well nourised, obese, well groomed                     CV: RRR, no MRG. No Carotid Bruits. No peripheral edema, warm, nontender Eyes: Conjunctivae clear without exudates or hemorrhage  Neuro: Detailed Neurologic Exam  Speech:    Speech is normal; fluent and spontaneous with normal comprehension.  Cognition:    The patient is oriented to person, place, and time;     recent and remote memory intact;     language fluent;  normal attention, concentration,     fund of knowledge Cranial Nerves:    The pupils are equal, round, and reactive to light. The fundi are normal and spontaneous venous pulsations are present. Visual fields are full to finger confrontation. Extraocular movements are intact. Trigeminal sensation is intact and the muscles of mastication are normal. The face is symmetric. The palate elevates in the midline. Hearing intact. Voice is normal. Shoulder shrug is normal. The tongue has normal motion without fasciculations.   Coordination:    Normal finger to nose and heel to shin. Normal rapid alternating movements.   Gait:    Heel-toe and tandem gait are normal.   Motor Observation:    No asymmetry, no atrophy, and no involuntary movements noted. Tone:    Normal muscle tone.    Posture:    Posture is normal. normal erect    Strength:    Strength is V/V in the upper and lower limbs.      Sensation: intact to LT     Reflex Exam:  DTR's:    Deep tendon reflexes in the upper and lower extremities are normal bilaterally.   Toes:    The toes are downgoing bilaterally.   Clonus:     Clonus is absent.    Assessment/Plan:    No orders of the defined types were placed in this encounter.  No orders of the defined types were placed in this encounter.   Cc: No ref. provider found,  Roseanna Rainbow, PA-C (Inactive)  Naomie Dean, MD  [facility] [address] [place], [place] [address]  Phone [phone] Fax [phone]

## 2020-04-12 ENCOUNTER — Telehealth: Payer: Self-pay | Admitting: Internal Medicine

## 2020-04-12 NOTE — Telephone Encounter (Signed)
Spoke with pt who reports some swelling of hands and feet.  Pt has been unable to weigh herself as her scales are packed d/t moving.  She denies SOB.  Pt states she has been drinking "tons" of Propel which helps with her POTS symptoms.  She has recently increased her Atenolol to 100mg  daily d/t an increase in palpitations.  She reports this has helped tremendously.  She reports her BP as being "all over the place."  She is very active due to moving and painting the new house.  Pt advised Dr is out of the office this week but will forward information to him for review and recommendation upon his return.  Pt advised to increase her plain water intake, elevate feet when sitting, monitor weight and BP first thing in the morning after urinating.  Pt verbalizes understanding and agrees with current plan.  Reviewed ED precautions.

## 2020-04-12 NOTE — Telephone Encounter (Signed)
   Pt c/o swelling: STAT is pt has developed SOB within 24 hours  1) How much weight have you gained and in what time span?   2) If swelling, where is the swelling located? Hands and feet   3) Are you currently taking a fluid pill? No  4) Are you currently SOB? No  5) Do you have a log of your daily weights (if so, list)? none  6) Have you gained 3 pounds in a day or 5 pounds in a week? 5 to 10 lbs in a week  7) Have you traveled recently? No   Pt said she is having swelling on her hands and feet, it mostly worst during the night and she gained weight 5-10 lbs in the past week. She said she is not sure if she needs an appt to see Dr. Graciela Husbands. She said she will taking some meds this morning and might fall asleep if she didn't answer to leave her a detailed message

## 2020-04-14 NOTE — Telephone Encounter (Signed)
Attempted phone call to pt, per Epic ok to leave voicemail message, pt advised per Dr Graciela Husbands decrease amount of Propel intake as swelling is most likely related to salt overload.  Please call [phone] for any further questions or concerns.

## 2020-04-14 NOTE — Telephone Encounter (Signed)
M. Good morning again  I would have her back off on propel for a few days.  It may just be salt overload.  If it doesn't get better we would need BMET AND Ua Thx.

## 2020-04-28 ENCOUNTER — Encounter: Payer: Self-pay | Admitting: Neurology

## 2020-04-28 ENCOUNTER — Ambulatory Visit: Payer: Medicaid Other | Admitting: Neurology

## 2020-04-28 ENCOUNTER — Other Ambulatory Visit: Payer: Self-pay

## 2020-04-28 ENCOUNTER — Telehealth: Payer: Self-pay | Admitting: Neurology

## 2020-04-28 VITALS — BP 129/93 | HR 71 | Ht 69.0 in | Wt 177.0 lb

## 2020-04-28 DIAGNOSIS — R51 Headache with orthostatic component, not elsewhere classified: Secondary | ICD-10-CM | POA: Diagnosis not present

## 2020-04-28 DIAGNOSIS — G43109 Migraine with aura, not intractable, without status migrainosus: Secondary | ICD-10-CM

## 2020-04-28 DIAGNOSIS — R519 Headache, unspecified: Secondary | ICD-10-CM

## 2020-04-28 DIAGNOSIS — G43709 Chronic migraine without aura, not intractable, without status migrainosus: Secondary | ICD-10-CM | POA: Diagnosis not present

## 2020-04-28 DIAGNOSIS — H539 Unspecified visual disturbance: Secondary | ICD-10-CM

## 2020-04-28 DIAGNOSIS — R6889 Other general symptoms and signs: Secondary | ICD-10-CM

## 2020-04-28 DIAGNOSIS — H538 Other visual disturbances: Secondary | ICD-10-CM

## 2020-04-28 MED ORDER — KETOROLAC TROMETHAMINE 60 MG/2ML IM SOLN
60.0000 mg | Freq: Once | INTRAMUSCULAR | Status: AC
  Administered 2020-04-28: 60 mg via INTRAMUSCULAR

## 2020-04-28 MED ORDER — TOPIRAMATE 25 MG PO TABS
25.0000 mg | ORAL_TABLET | Freq: Every evening | ORAL | 3 refills | Status: DC
Start: 2020-04-28 — End: 2020-05-12

## 2020-04-28 MED ORDER — TOPIRAMATE 25 MG PO TABS: 25.0000 mg | ORAL_TABLET | Freq: Every evening | ORAL | 3 refills | Status: DC

## 2020-04-28 NOTE — Progress Notes (Addendum)
[facility]    Provider:  Dr Lucia Gaskins Requesting Provider:  Gentry Roch MD Primary Care Provider:  Roseanna Rainbow, PA-C (Inactive)  CC:  Headaches, generalized light sensitivity, vestibular issues and visual disturbance  HPI:  Mia Rubio is a 40 y.o. female here as requested by No ref. provider found for headaches, visual disturbance, vestibular issues.  She has a past medical history of POTS, anxiety, seeing psychiatry to establish care.  I reviewed Dr. Glennon Mac notes, patient was seen there for visual disturbance, patient stated her vision seemed fine with glasses but she noted issues with three-dimensional perception in which some objects in her vision seem to move further closer away, exacerbated with motion such as riding the lawnmower or driving in the car.  She has no true diplopia, she says she has not been driving in several years because the depth perception problem seems to be an issue, light sensitivity in general and lights can sometimes trigger migraine headaches or scintillating scotoma, the visual aura can occur a number of times a week, she seen a neurologist does not trial of migraine medications, she has POTS disease and is on atenolol but does not think this is related and is connected to her POTS.  Dr. Marcial Pacas Martin's examination showed visual acuity of 20/20 and 20/25, small anisocoria with the right pupil larger than the left about the same in darkness and light, no relative apparent pupillary defect, full visual fields, full extraocular movements, slit-lamp and fundus exam normal, diagnosis "a relatively normal neuro ophthalmic examination ", minimal evidence of dry eye syndrome, may be a migraine related issue.  Patient saw Dr. Anne Hahn in October 2019, female with a history of migraine headache, migraines headaches around 2010, MRI of the brain unremarkable, patient has POTS since 2010, diagnosed with POTS in 2017, on Florinef, longstanding  history of motion sickness, rapid head movements and fluorescent lights may worsen her symptoms, lots of anxiety issues and now having panic attacks, denied any other focal neurologic symptoms.  She has light sensitivity, she has not driven since she has light sensitivity. She has tremors in hre hands. She has "lack of oxygen to the brain" because of a POTS flare, she has bad memory and can;t get words out, eyes tremor when she wakes up(dry eye?), when she is driving she may get hit by a ray of light and it caused her vision to start going away from her, causes anxiety and then a POTS flare, headlights at night trigger it, if she takes xanax she can avoid this and keeps her calm so she can enjoy loud music for example, she has auras twice a day, Migraines do not always have an aura and she tends to have auras without headaches, but can happen with headache, feels like they start slow, the aura is annoying but doesn't stop her from doing anything.  28 headache days a month, 15 migraine days a month without aura that are moderately severe, can be unilateral, pulsating/pounding/throbbing, movement makes it worse, photophobia, nausea, last up to 24 hours to 72 hours, ongoing for > 1 year at this severity and frequency, no medication overuse. Patient has blurry vision with the headaches, wakes with them and can be worse supine. No other focal neurologic deficits, associated symptoms, inciting events or modifiable factors.  Patient endorses trial periods more than 3 months in the past for these medications.  Medications tried that can be used in migraine management includes: Atenolol(tried moe than 3 months),Amitriptyline(tried more  than 3 months), venlafaxine(ried more than 3 months), sumatriptan, topamax(just started), amitriptyline, maxalt, gabapentin(more than 3 months trial)  Reviewed notes, labs and imaging from outside physicians, which showed: see above  05/21/2019: BMP normal  Review of  Systems: Patient complains of symptoms per HPI as well as the following symptoms:light sensitivity. Pertinent negatives and positives per HPI. All others negative.   Social History   Socioeconomic History  . Marital status: Married    Spouse name: Not on file  . Number of children: Not on file  . Years of education: Not on file  . Highest education level: Not on file  Occupational History  . Not on file  Tobacco Use  . Smoking status: Former Smoker    Packs/day: 1.00    Years: 14.00    Pack years: 14.00    Types: Cigarettes    Quit date: 2016    Years since quitting: 6.1  . Smokeless tobacco: Never Used  . Tobacco comment: doesn't think she smoked a pack a day for the whole time   Vaping Use  . Vaping Use: Never used  Substance and Sexual Activity  . Alcohol use: Not Currently  . Drug use: No  . Sexual activity: Not on file  Other Topics Concern  . Not on file  Social History Narrative   Lives at home with husband and son    Right handed   Caffeine: none    Social Determinants of Health   Financial Resource Strain: Not on file  Food Insecurity: Not on file  Transportation Needs: Not on file  Physical Activity: Not on file  Stress: Not on file  Social Connections: Not on file  Intimate Partner Violence: Not on file    Family History  Problem Relation Age of Onset  . Cancer Maternal Grandmother        leg  . Diabetes Maternal Grandmother   . Heart murmur Father   . Headache Maternal Great-grandmother        "sick headache", probably migraine     Past Medical History:  Diagnosis Date  . Common migraine with intractable migraine 12/19/2017  . Hypotension   . Low blood pressure   . Panic disorder 12/19/2017   "something that happens when I have a flare" regarding POTS  . POTS (postural orthostatic tachycardia syndrome)   . Tachycardia     Patient Active Problem List   Diagnosis Date Noted  . Chronic migraine without aura without status migrainosus,  not intractable 04/28/2020  . Migraine with aura and without status migrainosus, not intractable 04/28/2020  . Light sensitivity 04/28/2020  . POTS (postural orthostatic tachycardia syndrome) 12/19/2017  . Panic disorder 12/19/2017  . Common migraine with intractable migraine 12/19/2017  . Missed abortion 11/24/2016  . Intrauterine normal pregnancy 08/19/2014    Past Surgical History:  Procedure Laterality Date  . APPENDECTOMY    . BREAST SURGERY    . CESAREAN SECTION N/A 08/19/2014   Procedure: CESAREAN SECTION;  Surgeon: Myna Hidalgo, DO;  Location: [facility];  Service: Obstetrics;  Laterality: N/A;  . DILATION AND EVACUATION N/A 11/24/2016   Procedure: DILATATION AND EVACUATION;  Surgeon: Kirkland Hun, MD;  Location: [facility];  Service: Gynecology;  Laterality: N/A;  . FOOT SURGERY  2011 2012  . KNEE SURGERY  2001  2002  . plantar fasciitis      Current Outpatient Medications  Medication Sig Dispense Refill  . ALPRAZolam (XANAX XR) 1 MG 24 hr tablet Take 1 mg by mouth  daily.    Marland Kitchen ALPRAZolam (XANAX) 0.5 MG tablet Take 0.5 mg by mouth at bedtime as needed for anxiety.    Marland Kitchen atenolol (TENORMIN) 50 MG tablet Take 1.5 tablets (75 mg total) by mouth daily. Take 1.5 tablet (75 mg) by mouth once daily (Patient taking differently: Take 100 mg by mouth daily. Take 1.5 tablet (75 mg) by mouth once daily) 135 tablet 3  . fludrocortisone (FLORINEF) 0.1 MG tablet Take 1 tablet (0.1 mg total) by mouth daily. 180 tablet 3  . levonorgestrel-ethinyl estradiol (SEASONALE,INTROVALE,JOLESSA) 0.15-0.03 MG tablet Take 1 tablet by mouth daily.    . meclizine (ANTIVERT) 25 MG tablet Take 25 mg by mouth 3 (three) times daily as needed for dizziness.    . Multiple Vitamin (MULTIVITAMIN PO) Take by mouth.    . ondansetron (ZOFRAN ODT) 8 MG disintegrating tablet Take 1 tablet (8 mg total) by mouth every 8 (eight) hours as needed for nausea or vomiting. 30 tablet 0  . venlafaxine XR (EFFEXOR-XR) 150 MG 24 hr  capsule TAKE 1 CAPSULE(150 MG) BY MOUTH DAILY WITH BREAKFAST 30 capsule 0  . zolpidem (AMBIEN) 10 MG tablet Take 10 mg by mouth at bedtime as needed for sleep.    Marland Kitchen topiramate (TOPAMAX) 25 MG tablet Take 1 tablet (25 mg total) by mouth at bedtime. 30 tablet 3   No current facility-administered medications for this visit.    Allergies as of 04/28/2020 - Review Complete 04/28/2020  Allergen Reaction Noted  . Caffeine Palpitations 07/14/2015  . Ciprofloxacin  11/30/2019  . Codeine Itching 11/28/2011  . Meperidine hcl  11/30/2019  . Ondansetron  11/30/2019  . Phenergan [promethazine]  11/30/2019  . Prednisone  11/30/2019  . Septra [sulfamethoxazole-trimethoprim]  11/30/2019  . Phenergan [promethazine hcl] Itching and Rash 05/02/2013    Vitals: BP (!) 129/93 (BP Location: Left Arm, Patient Position: Sitting)   Pulse 71   Ht 5\' 9"  (1.753 m)   Wt 177 lb (80.3 kg)   Breastfeeding No   BMI 26.14 kg/m  Last Weight:  Wt Readings from Last 1 Encounters:  04/28/20 177 lb (80.3 kg)   Last Height:   Ht Readings from Last 1 Encounters:  04/28/20 5\' 9"  (1.753 m)     Physical exam: Exam: Gen: NAD, conversant, well nourised, well groomed                     CV: RRR, no MRG. No Carotid Bruits. No peripheral edema, warm, nontender Eyes: Conjunctivae clear without exudates or hemorrhage  Neuro: Detailed Neurologic Exam  Speech:    Speech is normal; fluent and spontaneous with normal comprehension.  Cognition:    The patient is oriented to person, place, and time;     recent and remote memory intact;     language fluent;     normal attention, concentration,     fund of knowledge Cranial Nerves:    The pupils are round, and reactive to light. Physiologic anisocoria. The fundi are normal and spontaneous venous pulsations are present. Visual fields are full to finger confrontation. Extraocular movements are intact. Trigeminal sensation is intact and the muscles of mastication are  normal. The face is symmetric. The palate elevates in the midline. Hearing intact. Voice is normal. Shoulder shrug is normal. The tongue has normal motion without fasciculations.   Coordination:    Normal finger to nose    Gait: normal.   Motor Observation:    No asymmetry, no atrophy, and no involuntary movements  noted. Tone:    Normal muscle tone.    Posture:    Posture is normal. normal erect    Strength:    Strength is V/V in the upper and lower limbs.      Sensation: intact to LT     Reflex Exam:  DTR's:    Deep tendon reflexes in the upper and lower extremities are normal bilaterally.   Toes:    The toes are downgoing bilaterally.   Clonus:    Clonus is absent.    Assessment/Plan:  40 year old with chronic migraine, failed multiple classes of medications. We discussed options in detail, will initiate botox.   - MRI of the brain w/wo contrast: MRI brain due to concerning symptoms of morning headaches, positional headaches,vision changes  to look for space occupying mass, chiari or intracranial hypertension (pseudotumor). - She borrowed my somilight glasses and allay lamp, she will bring them back at botox. - discussed: FL-41 tinting (somnilight or theraspecs), Car tinting (et online for additonal tinting), Allay light, Emgality in the future if needed* - Topamax at bedtime for migraine prevention - Acute: Rizatriptan: Please take one tablet at the onset of your headache. If it does not improve the symptoms please take one additional tablet. Do not take more then 2 tablets in 24hrs. Do not take use more then 2 to 3 times in a week.  Orders Placed This Encounter  Procedures  . MR BRAIN W WO CONTRAST   Meds ordered this encounter  Medications  . DISCONTD: topiramate (TOPAMAX) 25 MG tablet    Sig: Take 1 tablet (25 mg total) by mouth at bedtime.    Dispense:  30 tablet    Refill:  3  . topiramate (TOPAMAX) 25 MG tablet    Sig: Take 1 tablet (25 mg total) by mouth at  bedtime.    Dispense:  30 tablet    Refill:  3  . ketorolac (TORADOL) injection 60 mg    Cc: Gentry Rochimothy Martin MD  Roseanna RainbowWright, Kristen M, PA-C (Inactive)  Naomie DeanAntonia Mitsuru Dault, MD  [facility] [address] [place], [place] [id number]  Phone [phone] Fax [phone]

## 2020-04-28 NOTE — Telephone Encounter (Signed)
Please initiate botox for migraine

## 2020-04-28 NOTE — Patient Instructions (Addendum)
FL-41 tinting (somnilight or theraspecs) Car tinting (et online for additonal tinting) Allay light MRI brain w/wo contrast  Start Topamax at bedtime for migraine prevention See you for Botox Acute: Rizatriptan: Please take one tablet at the onset of your headache. If it does not improve the symptoms please take one additional tablet. Do not take more then 2 tablets in 24hrs. Do not take use more then 2 to 3 times in a week.   Rizatriptan disintegrating tablets What is this medicine? RIZATRIPTAN (rye za TRIP tan) is used to treat migraines with or without aura. An aura is a strange feeling or visual disturbance that warns you of an attack. It is not used to prevent migraines. This medicine may be used for other purposes; ask your health care provider or pharmacist if you have questions. COMMON BRAND NAME(S): Maxalt-MLT What should I tell my health care provider before I take this medicine? They need to know if you have any of these conditions:  cigarette smoker  circulation problems in fingers and toes  diabetes  heart disease  high blood pressure  high cholesterol  history of irregular heartbeat  history of stroke  kidney disease  liver disease  stomach or intestine problems  an unusual or allergic reaction to rizatriptan, other medicines, foods, dyes, or preservatives  pregnant or trying to get pregnant  breast-feeding How should I use this medicine? Take this medicine by mouth. Follow the directions on the prescription label. Leave the tablet in the sealed blister pack until you are ready to take it. With dry hands, open the blister and gently remove the tablet. If the tablet breaks or crumbles, throw it away and take a new tablet out of the blister pack. Place the tablet in the mouth and allow it to dissolve, and then swallow. Do not cut, crush, or chew this medicine. You do not need water to take this medicine. Do not take it more often than directed. Talk to your  pediatrician regarding the use of this medicine in children. While this drug may be prescribed for children as young as 6 years for selected conditions, precautions do apply. Overdosage: If you think you have taken too much of this medicine contact a poison control center or emergency room at once. NOTE: This medicine is only for you. Do not share this medicine with others. What if I miss a dose? This does not apply. This medicine is not for regular use. What may interact with this medicine? Do not take this medicine with any of the following medicines:  certain medicines for migraine headache like almotriptan, eletriptan, frovatriptan, naratriptan, rizatriptan, sumatriptan, zolmitriptan  ergot alkaloids like dihydroergotamine, ergonovine, ergotamine, methylergonovine  MAOIs like Carbex, Eldepryl, Marplan, Nardil, and Parnate This medicine may also interact with the following medications:  certain medicines for depression, anxiety, or psychotic disorders  propranolol This list may not describe all possible interactions. Give your health care provider a list of all the medicines, herbs, non-prescription drugs, or dietary supplements you use. Also tell them if you smoke, drink alcohol, or use illegal drugs. Some items may interact with your medicine. What should I watch for while using this medicine? Visit your healthcare professional for regular checks on your progress. Tell your healthcare professional if your symptoms do not start to get better or if they get worse. You may get drowsy or dizzy. Do not drive, use machinery, or do anything that needs mental alertness until you know how this medicine affects you. Do not stand  up or sit up quickly, especially if you are an older patient. This reduces the risk of dizzy or fainting spells. Alcohol may interfere with the effect of this medicine. Your mouth may get dry. Chewing sugarless gum or sucking hard candy and drinking plenty of water may help.  Contact your healthcare professional if the problem does not go away or is severe. If you take migraine medicines for 10 or more days a month, your migraines may get worse. Keep a diary of headache days and medicine use. Contact your healthcare professional if your migraine attacks occur more frequently. What side effects may I notice from receiving this medicine? Side effects that you should report to your doctor or health care professional as soon as possible:  allergic reactions like skin rash, itching or hives, swelling of the face, lips, or tongue  chest pain or chest tightness  signs and symptoms of a dangerous change in heartbeat or heart rhythm like chest pain; dizziness; fast, irregular heartbeat; palpitations; feeling faint or lightheaded; falls; breathing problems  signs and symptoms of a stroke like changes in vision; confusion; trouble speaking or understanding; severe headaches; sudden numbness or weakness of the face, arm or leg; trouble walking; dizziness; loss of balance or coordination  signs and symptoms of serotonin syndrome like irritable; confusion; diarrhea; fast or irregular heartbeat; muscle twitching; stiff muscles; trouble walking; sweating; high fever; seizures; chills; vomiting Side effects that usually do not require medical attention (report to your doctor or health care professional if they continue or are bothersome):  diarrhea  dizziness  drowsiness  dry mouth  headache  nausea, vomiting  pain, tingling, numbness in the hands or feet  stomach pain This list may not describe all possible side effects. Call your doctor for medical advice about side effects. You may report side effects to FDA at 1-800-FDA-1088. Where should I keep my medicine? Keep out of the reach of children. Store at room temperature between 15 and 30 degrees C (59 and 86 degrees F). Protect from light and moisture. Throw away any unused medicine after the expiration date. NOTE:  This sheet is a summary. It may not cover all possible information. If you have questions about this medicine, talk to your doctor, pharmacist, or health care provider.  2021 Elsevier/Gold Standard (2017-08-27 14:58:08) Galcanezumab injection What is this medicine? GALCANEZUMAB (gal ka NEZ ue mab) is used to prevent migraines and treat cluster headaches. This medicine may be used for other purposes; ask your health care provider or pharmacist if you have questions. COMMON BRAND NAME(S): Emgality What should I tell my health care provider before I take this medicine? They need to know if you have any of these conditions:  an unusual or allergic reaction to galcanezumab, other medicines, foods, dyes, or preservatives  pregnant or trying to get pregnant  breast-feeding How should I use this medicine? This medicine is for injection under the skin. You will be taught how to prepare and give this medicine. Use exactly as directed. Take your medicine at regular intervals. Do not take your medicine more often than directed. It is important that you put your used needles and syringes in a special sharps container. Do not put them in a trash can. If you do not have a sharps container, call your pharmacist or healthcare provider to get one. Talk to your pediatrician regarding the use of this medicine in children. Special care may be needed. Overdosage: If you think you have taken too much of this medicine  contact a poison control center or emergency room at once. NOTE: This medicine is only for you. Do not share this medicine with others. What if I miss a dose? If you miss a dose, take it as soon as you can. If it is almost time for your next dose, take only that dose. Do not take double or extra doses. What may interact with this medicine? Interactions are not expected. This list may not describe all possible interactions. Give your health care provider a list of all the medicines, herbs,  non-prescription drugs, or dietary supplements you use. Also tell them if you smoke, drink alcohol, or use illegal drugs. Some items may interact with your medicine. What should I watch for while using this medicine? Tell your doctor or healthcare professional if your symptoms do not start to get better or if they get worse. What side effects may I notice from receiving this medicine? Side effects that you should report to your doctor or health care professional as soon as possible:  allergic reactions like skin rash, itching or hives, swelling of the face, lips, or tongue Side effects that usually do not require medical attention (report these to your doctor or health care professional if they continue or are bothersome):  pain, redness, or irritation at site where injected This list may not describe all possible side effects. Call your doctor for medical advice about side effects. You may report side effects to FDA at 1-800-FDA-1088. Where should I keep my medicine? Keep out of the reach of children. You will be instructed on how to store this medicine. Throw away any unused medicine after the expiration date on the label. NOTE: This sheet is a summary. It may not cover all possible information. If you have questions about this medicine, talk to your doctor, pharmacist, or health care provider.  2021 Elsevier/Gold Standard (2017-07-31 12:03:23) Topiramate tablets What is this medicine? TOPIRAMATE (toe PYRE a mate) is used to treat seizures in adults or children with epilepsy. It is also used for the prevention of migraine headaches. This medicine may be used for other purposes; ask your health care provider or pharmacist if you have questions. COMMON BRAND NAME(S): Topamax, Topiragen What should I tell my health care provider before I take this medicine? They need to know if you have any of these conditions:  bleeding disorder  kidney disease  lung disease  suicidal thoughts, plans,  or attempt  an unusual or allergic reaction to topiramate, other medicines, foods, dyes, or preservatives  pregnant or trying to get pregnant  breast-feeding How should I use this medicine? Take this medicine by mouth with a glass of water. Follow the directions on the prescription label. Do not cut, crush or chew this medicine. Swallow the tablets whole. You can take it with or without food. If it upsets your stomach, take it with food. Take your medicine at regular intervals. Do not take it more often than directed. Do not stop taking except on your doctor's advice. A special MedGuide will be given to you by the pharmacist with each prescription and refill. Be sure to read this information carefully each time. Talk to your pediatrician regarding the use of this medicine in children. While this drug may be prescribed for children as young as 35 years of age for selected conditions, precautions do apply. Overdosage: If you think you have taken too much of this medicine contact a poison control center or emergency room at once. NOTE: This medicine is only  for you. Do not share this medicine with others. What if I miss a dose? If you miss a dose, take it as soon as you can. If your next dose is to be taken in less than 6 hours, then do not take the missed dose. Take the next dose at your regular time. Do not take double or extra doses. What may interact with this medicine? This medicine may interact with the following medications:  acetazolamide  alcohol  antihistamines for allergy, cough, and cold  aspirin and aspirin-like medicines  atropine  birth control pills  certain medicines for anxiety or sleep  certain medicines for bladder problems like oxybutynin, tolterodine  certain medicines for depression like amitriptyline, fluoxetine, sertraline  certain medicines for seizures like carbamazepine, phenobarbital, phenytoin, primidone, valproic acid, zonisamide  certain medicines for  stomach problems like dicyclomine, hyoscyamine  certain medicines for travel sickness like scopolamine  certain medicines for Parkinson's disease like benztropine, trihexyphenidyl  certain medicines that treat or prevent blood clots like warfarin, enoxaparin, dalteparin, apixaban, dabigatran, and rivaroxaban  digoxin  general anesthetics like halothane, isoflurane, methoxyflurane, propofol  hydrochlorothiazide  ipratropium  lithium  medicines that relax muscles for surgery  metformin  narcotic medicines for pain  NSAIDs, medicines for pain and inflammation, like ibuprofen or naproxen  phenothiazines like chlorpromazine, mesoridazine, prochlorperazine, thioridazine  pioglitazone This list may not describe all possible interactions. Give your health care provider a list of all the medicines, herbs, non-prescription drugs, or dietary supplements you use. Also tell them if you smoke, drink alcohol, or use illegal drugs. Some items may interact with your medicine. What should I watch for while using this medicine? Visit your doctor or health care professional for regular checks on your progress. Tell your health care professional if your symptoms do not start to get better or if they get worse. Do not stop taking except on your health care professional's advice. You may develop a severe reaction. Your health care professional will tell you how much medicine to take. Wear a medical ID bracelet or chain. Carry a card that describes your disease and details of your medicine and dosage times. This medicine can reduce the response of your body to heat or cold. Dress warm in cold weather and stay hydrated in hot weather. If possible, avoid extreme temperatures like saunas, hot tubs, very hot or cold showers, or activities that can cause dehydration such as vigorous exercise. Check with your health care professional if you have severe diarrhea, nausea, and vomiting, or if you sweat a lot. The  loss of too much body fluid may make it dangerous for you to take this medicine. You may get drowsy or dizzy. Do not drive, use machinery, or do anything that needs mental alertness until you know how this medicine affects you. Do not stand up or sit up quickly, especially if you are an older patient. This reduces the risk of dizzy or fainting spells. Alcohol may interfere with the effect of this medicine. Avoid alcoholic drinks. Tell your health care professional right away if you have any change in your eyesight. Patients and their families should watch out for new or worsening depression or thoughts of suicide. Also watch out for sudden changes in feelings such as feeling anxious, agitated, panicky, irritable, hostile, aggressive, impulsive, severely restless, overly excited and hyperactive, or not being able to sleep. If this happens, especially at the beginning of treatment or after a change in dose, call your healthcare professional. This medicine may cause  serious skin reactions. They can happen weeks to months after starting the medicine. Contact your health care provider right away if you notice fevers or flu-like symptoms with a rash. The rash may be red or purple and then turn into blisters or peeling of the skin. Or, you might notice a red rash with swelling of the face, lips or lymph nodes in your neck or under your arms. Birth control may not work properly while you are taking this medicine. Talk to your health care professional about using an extra method of birth control. Women should inform their health care professional if they wish to become pregnant or think they might be pregnant. There is a potential for serious side effects and harm to an unborn child. Talk to your health care professional for more information. What side effects may I notice from receiving this medicine? Side effects that you should report to your doctor or health care professional as soon as possible:  allergic  reactions like skin rash, itching or hives, swelling of the face, lips, or tongue  blood in the urine  changes in vision  confusion  loss of memory  pain in lower back or side  pain when urinating  redness, blistering, peeling or loosening of the skin, including inside the mouth  signs and symptoms of bleeding such as bloody or black, tarry stools; red or dark brown urine; spitting up blood or brown material that looks like coffee grounds; red spots on the skin; unusual bruising or bleeding from the eyes, gums, or nose  signs and symptoms of increased acid in the body like breathing fast; fast heartbeat; headache; confusion; unusually weak or tired; nausea, vomiting  suicidal thoughts, mood changes  trouble speaking or understanding  unusual sweating  unusually weak or tired Side effects that usually do not require medical attention (report to your doctor or health care professional if they continue or are bothersome):  dizziness  drowsiness  fever  loss of appetite  nausea, vomiting  pain, tingling, numbness in the hands or feet  stomach pain  tiredness  upset stomach This list may not describe all possible side effects. Call your doctor for medical advice about side effects. You may report side effects to FDA at 1-800-FDA-1088. Where should I keep my medicine? Keep out of the reach of children and pets. Store between 15 and 30 degrees C (59 and 86 degrees F). Protect from moisture. Keep the container tightly closed. Get rid of any unused medicine after the expiration date. To get rid of medicines that are no longer needed or have expired:  Take the medicine to a medicine take-back program. Check with your pharmacy or law enforcement to find a location.  If you cannot return the medicine, check the label or package insert to see if the medicine should be thrown out in the garbage or flushed down the toilet. If you are not sure, ask your health care provider. If  it is safe to put it in the trash, empty the medicine out of the container. Mix the medicine with cat litter, dirt, coffee grounds, or other unwanted substance. Seal the mixture in a bag or container. Put it in the trash. NOTE: This sheet is a summary. It may not cover all possible information. If you have questions about this medicine, talk to your doctor, pharmacist, or health care provider.  2021 Elsevier/Gold Standard (2019-08-27 15:41:57)

## 2020-04-28 NOTE — Progress Notes (Signed)
Toradol 60 mg IM administed in RUOQ of R buttock per v.o. Dr Lucia Gaskins. Pt tolerated well. Aseptic technique maintained and bandaid applied. See MAR.

## 2020-04-29 ENCOUNTER — Telehealth: Payer: Self-pay | Admitting: Neurology

## 2020-04-29 NOTE — Telephone Encounter (Signed)
Botox charge sheet completed and is pending MD signature. Pt needs to sign consent at first injection appointment. Dx: N79.728.

## 2020-04-29 NOTE — Telephone Encounter (Signed)
[facility] community auth: [id number] (exp. 04/29/20 to 06/13/20) ordre sent to GI. They will reach out to the patient to schedule.

## 2020-05-02 NOTE — Telephone Encounter (Signed)
Received charge sheet for 200 units of Botox for G43.709. Filled out [facility] [employer] PA form and will give to MD to sign.

## 2020-05-03 NOTE — Telephone Encounter (Signed)
Faxed signed PA form to [facility].

## 2020-05-04 ENCOUNTER — Telehealth: Payer: Self-pay | Admitting: Neurology

## 2020-05-04 ENCOUNTER — Other Ambulatory Visit: Payer: Self-pay | Admitting: Neurology

## 2020-05-04 MED ORDER — ALPRAZOLAM 0.25 MG PO TABS: ORAL_TABLET | ORAL | 0 refills | Status: DC

## 2020-05-04 NOTE — Telephone Encounter (Signed)
Received denial letter via fax from [facility] stating the following:   "Per your health plan's criteria, this drug is covered if you meet the following: (A) You have tried preventative drugs from two of the following different drug classes, each for three months of therapy: (I) Calcium channel blocker (II) Tricyclic antidepressants (III) Anticonvulsants The information provided does not show that you meet the criteria listed above"  I faxed in Dr. Trevor Mace office notes from patient's visit on 3/3. Dr. Trevor Mace notes state that the patient has tried both a tricyclic antidepressant (Amitriptyline) and an anticonvulsant (Topamax). I believe they are looking for the notes to state how long the patient has tried both.

## 2020-05-04 NOTE — Telephone Encounter (Signed)
I sent in xanax, thanks

## 2020-05-04 NOTE — Telephone Encounter (Signed)
Pt. is requesting to be prescribed something for claustrophobia for her MRI tomorrow.  Pharmacy: [facility] [phone]

## 2020-05-04 NOTE — Progress Notes (Signed)
Xanax for MRI

## 2020-05-04 NOTE — Telephone Encounter (Signed)
Yes, how long is she expected to try medications?

## 2020-05-05 ENCOUNTER — Other Ambulatory Visit: Payer: Self-pay

## 2020-05-05 ENCOUNTER — Ambulatory Visit
Admission: RE | Admit: 2020-05-05 | Discharge: 2020-05-05 | Disposition: A | Discharge status: Home / Self Care | Payer: Medicaid Other | Source: Ambulatory Visit | Attending: Neurology | Admitting: Neurology

## 2020-05-05 DIAGNOSIS — R519 Headache, unspecified: Secondary | ICD-10-CM

## 2020-05-05 DIAGNOSIS — R51 Headache with orthostatic component, not elsewhere classified: Secondary | ICD-10-CM

## 2020-05-05 DIAGNOSIS — H539 Unspecified visual disturbance: Secondary | ICD-10-CM

## 2020-05-05 DIAGNOSIS — H538 Other visual disturbances: Secondary | ICD-10-CM

## 2020-05-05 MED ORDER — GADOBENATE DIMEGLUMINE 529 MG/ML IV SOLN
15.0000 mL | Freq: Once | INTRAVENOUS | Status: AC | PRN
  Administered 2020-05-05: 15 mL via INTRAVENOUS

## 2020-05-05 NOTE — Telephone Encounter (Signed)
I updated the chart. When you make her appointment remind her to bring back the allay lamp and sunglasses I lent her thanks!

## 2020-05-05 NOTE — Telephone Encounter (Signed)
I called the patient to advise of the denial, advised that we are going to seek an appeal. E-mailed patient the appeal form for her to sign. Once I receive her signature, I will fax to [facility] with updated notes. Once we receive an appeal decision I will call patient to schedule and relay Dr. Trevor Mace message.

## 2020-05-05 NOTE — Telephone Encounter (Signed)
Patient came into the office and signed the form. I faxed the form with notes to [facility]. The form states we will receive a decision in 30 calendar days.

## 2020-05-12 ENCOUNTER — Telehealth: Payer: Self-pay | Admitting: Neurology

## 2020-05-12 NOTE — Telephone Encounter (Signed)
I called the pt and discussed. She stated she has been on the Topamax 25 mg QHS for 2 weeks and has been very sleepy. She states her POTS is flaring up due to how much lying down/sleeping she has done. She is tired and feeling heavy by 7 PM. Her husband feels she needs to come off as well. The Topamax has helped with migraines however. The patient had already planned to stop it effective today. I let the pt know that since she is on the lowest dose that she is able to stop it and tapering is not necessary. I let her know we would call back if there were any concerns and that a message would be sent to Dr Lucia Gaskins. The pt verbalized appreciation for the call.   Topamax d/c from med list d/t side effects.

## 2020-05-12 NOTE — Telephone Encounter (Signed)
Pt called,topiramate (TOPAMAX) 25 MG tablet causing me to sleep too much. I sleep 2 to 3 times a day. Causing my POTS to flare up. Can I stop taking it or taper off. Would like a call from the nurse.

## 2020-05-12 NOTE — Addendum Note (Signed)
Addended by: Bertram Savin on: 05/12/2020 04:32 PM   Modules accepted: Orders

## 2020-05-12 NOTE — Telephone Encounter (Signed)
Pt returned call. Please call back when available. 

## 2020-05-12 NOTE — Telephone Encounter (Signed)
I returned the pt's call and LVM for call back.

## 2020-05-17 ENCOUNTER — Other Ambulatory Visit: Payer: Self-pay | Admitting: Neurology

## 2020-05-17 NOTE — Telephone Encounter (Signed)
Thank you Toma Copier! I'll let the patient know.

## 2020-05-17 NOTE — Telephone Encounter (Signed)
We received a letter from [facility] indicating appeal request had been received on May 03, 2020 and a determination is anticipated by June 04, 2020. Any updates to appeal have to be sent by May 19, 2020. PA # [id number].   [facility] [employer] [place] [address] [place], [place] [address] Phone [phone] Fax [phone]

## 2020-05-17 NOTE — Telephone Encounter (Signed)
Yes, I will call her in zonisamide let's try that. Alos schedule her for botox with samples with me. Remind her to bring back the allay lamp and sunglasses I lent her thanks!

## 2020-05-30 NOTE — Telephone Encounter (Signed)
Due to her POTS CCB is contraindicated thanks

## 2020-05-30 NOTE — Telephone Encounter (Signed)
Mary with [facility] called me back regarding patient's appeal wanting to know if patient has a contraindication to a calcium channel blocker or if she has tried one in the past. I advised that I did not see a contraindication or that she had taken one previously. Corrie Dandy states that if patient has not, she will need to try a medication such as amlodipine or verapamil and she will close the case. She advised that a PA for Botox can be resubmitted after patient has had a 3 month trial with a CCB. I asked her not to close the case yet, and advised that I would check with MD first to see if there is something that I have missed. Corrie Dandy states she will keep the case open until tomorrow.

## 2020-05-30 NOTE — Telephone Encounter (Addendum)
Received a call from [facility] with [facility] [phone]) regarding patient's appeal, asking for clarification on how long patient has taken Topamax. I advised that patient started Topamax after the denial but could not tolerate it, as it made her sleepy and interfered with her POTS condition (see chart note from 3/17).

## 2020-05-30 NOTE — Telephone Encounter (Signed)
I called Mary back and LVM advising Dr. Trevor Mace message.

## 2020-05-31 ENCOUNTER — Ambulatory Visit: Payer: Medicaid Other | Admitting: Neurology

## 2020-05-31 DIAGNOSIS — G43709 Chronic migraine without aura, not intractable, without status migrainosus: Secondary | ICD-10-CM

## 2020-05-31 MED ORDER — RIZATRIPTAN BENZOATE 10 MG PO TBDP: 10.0000 mg | ORAL_TABLET | ORAL | 11 refills | Status: DC | PRN

## 2020-05-31 NOTE — Progress Notes (Signed)
Botox consent signed  Botox- 200 units x 1 vial Lot: [id number] Expiration: 11/2022 NDC: 7616-0737-10  Bacteriostatic 0.9% Sodium Chloride- 51mL total Lot: [id number] Expiration: 03/29/2021 NDC: 5462-7035-00  Dx: X38.182 sample

## 2020-05-31 NOTE — Progress Notes (Signed)
Consent Form Botulism Toxin Injection For Chronic Migraine   First botox. Bill by time  I spent 30 minutes of face-to-face and non-face-to-face time with patient on the  1. Chronic migraine without aura without status migrainosus, not intractable    diagnosis.  This included previsit chart review, lab review, study review, order entry, electronic health record documentation, patient education on the different diagnostic and therapeutic options, counseling and coordination of care, risks and benefits of management, compliance, or risk factor reduction   Reviewed orally with patient, additionally signature is on file:  Botulism toxin has been approved by the Federal drug administration for treatment of chronic migraine. Botulism toxin does not cure chronic migraine and it may not be effective in some patients.  The administration of botulism toxin is accomplished by injecting a small amount of toxin into the muscles of the neck and head. Dosage must be titrated for each individual. Any benefits resulting from botulism toxin tend to wear off after 3 months with a repeat injection required if benefit is to be maintained. Injections are usually done every 3-4 months with maximum effect peak achieved by about 2 or 3 weeks. Botulism toxin is expensive and you should be sure of what costs you will incur resulting from the injection.  The side effects of botulism toxin use for chronic migraine may include:   -Transient, and usually mild, facial weakness with facial injections  -Transient, and usually mild, head or neck weakness with head/neck injections  -Reduction or loss of forehead facial animation due to forehead muscle weakness  -Eyelid drooping  -Dry eye  -Pain at the site of injection or bruising at the site of injection  -Double vision  -Potential unknown long term risks  Contraindications: You should not have Botox if you are pregnant, nursing, allergic to albumin, have an infection, skin  condition, or muscle weakness at the site of the injection, or have myasthenia gravis, Lambert-Eaton syndrome, or ALS.  It is also possible that as with any injection, there may be an allergic reaction or no effect from the medication. Reduced effectiveness after repeated injections is sometimes seen and rarely infection at the injection site may occur. All care will be taken to prevent these side effects. If therapy is given over a long time, atrophy and wasting in the muscle injected may occur. Occasionally the patient's become refractory to treatment because they develop antibodies to the toxin. In this event, therapy needs to be modified.  I have read the above information and consent to the administration of botulism toxin.    BOTOX PROCEDURE NOTE FOR MIGRAINE HEADACHE    Contraindications and precautions discussed with patient(above). Aseptic procedure was observed and patient tolerated procedure. Procedure performed by Dr. Artemio Aly  The condition has existed for more than 6 months, and pt does not have a diagnosis of ALS, Myasthenia Gravis or Lambert-Eaton Syndrome.  Risks and benefits of injections discussed and pt agrees to proceed with the procedure.  Written consent obtained  These injections are medically necessary. Pt  receives good benefits from these injections. These injections do not cause sedations or hallucinations which the oral therapies may cause.  Description of procedure:  The patient was placed in a sitting position. The standard protocol was used for Botox as follows, with 5 units of Botox injected at each site:   -Procerus muscle, midline injection  -Corrugator muscle, bilateral injection  -Frontalis muscle, bilateral injection, with 2 sites each side, medial injection was performed in the upper one  third of the frontalis muscle, in the region vertical from the medial inferior edge of the superior orbital rim. The lateral injection was again in the upper one  third of the forehead vertically above the lateral limbus of the cornea, 1.5 cm lateral to the medial injection site.  -Temporalis muscle injection, 4 sites, bilaterally. The first injection was 3 cm above the tragus of the ear, second injection site was 1.5 cm to 3 cm up from the first injection site in line with the tragus of the ear. The third injection site was 1.5-3 cm forward between the first 2 injection sites. The fourth injection site was 1.5 cm posterior to the second injection site.   -Occipitalis muscle injection, 3 sites, bilaterally. The first injection was done one half way between the occipital protuberance and the tip of the mastoid process behind the ear. The second injection site was done lateral and superior to the first, 1 fingerbreadth from the first injection. The third injection site was 1 fingerbreadth superiorly and medially from the first injection site.  -Cervical paraspinal muscle injection, 2 sites, bilateral knee first injection site was 1 cm from the midline of the cervical spine, 3 cm inferior to the lower border of the occipital protuberance. The second injection site was 1.5 cm superiorly and laterally to the first injection site.  -Trapezius muscle injection was performed at 3 sites, bilaterally. The first injection site was in the upper trapezius muscle halfway between the inflection point of the neck, and the acromion. The second injection site was one half way between the acromion and the first injection site. The third injection was done between the first injection site and the inflection point of the neck.   Will return for repeat injection in 3 months.   200 units of Botox was used, any Botox not injected was wasted. The patient tolerated the procedure well, there were no complications of the above procedure.

## 2020-06-10 MED ORDER — ATENOLOL 100 MG PO TABS: 100.0000 mg | ORAL_TABLET | Freq: Every day | ORAL | 3 refills | Status: DC

## 2020-06-14 MED ORDER — BOTOX 200 UNITS IJ SOLR: INTRAMUSCULAR | 1 refills | Status: DC

## 2020-06-14 NOTE — Telephone Encounter (Signed)
Received letter from [facility] today. They have decided to overturn the denial and they have now approved the request from 06/01/20- 12/01/20.Case ID: [id number].

## 2020-06-14 NOTE — Telephone Encounter (Signed)
botox prescription sent to [facility] in [place].

## 2020-06-14 NOTE — Addendum Note (Signed)
Addended by: Bertram Savin on: 06/14/2020 02:27 PM   Modules accepted: Orders

## 2020-06-20 NOTE — Telephone Encounter (Signed)
Spoke with Jasmine December at [facility] to schedule Botox delivery. Botox TBD 4/28.

## 2020-06-23 NOTE — Telephone Encounter (Signed)
Received (1) 200 unit vial of Botox today from [facility]. 

## 2020-07-25 NOTE — Progress Notes (Deleted)
Cardiology Office Note Date:  07/25/2020  Patient ID:  Mia Rubio, DOB 07-29-1980, MRN [id number] PCP:  Mitzi Hansen, NP  Cardiologist:  Dr. Graciela Husbands    Chief Complaint:  *** annual visit  History of Present Illness: Mia Rubio is a 40 y.o. female with history of POTS, anxiety, migraines   Dr. Graciela Husbands recaps her history  As below:  "Her dizziness in the past has been recumbent associated with normal vital signs which to me suggested a noncardiac perhaps neuropsychiatric origin. It was recommended that she be referred for consideration of these components. There also was a component of heat and orthostatic intolerance.  An abdominal binder helped considerably    She is here having lost 2 children miscarriages in the last 6 months.  The first was associated with profound and persistent bleeding and resulted in exercise intolerance bedrest and profound dizziness.  She recalls that when she had hormonal suppression of her periods she felt the best that she had in years.  She and her husband are continuing to try to have a second child.  Hence got pregnant again and she miscarried her second child about 6 months ago.  That is also the timeframe in which she began to have significantly worsened symptoms.  When seen 3/19, >>"She says she is barely able to sit up without being dizzy.  She is not able to stand well.  Fluorescent lights are bothersome.  She is unable to exercise  She is nauseated and has had significant decrease PO intake."  The symptoms have continued to worsen.    A second major event supervened about 7/19 >> she ended up with an episode of profound anxiety associated with light and sound hypersensitivity both of which have worsened since then.   This turned out to be temporally related to hormonal therapy for another attempted pregnancy  In the interim she has seen Dr. Anne Hahn who thought these may be related to migraines.  He has treated her with Effexor at rapidly  increasing doses 37.5--150.  She has a sense that her anxiety and tremulousness is much much less.  She notes however also that there have been disturbing nightmarish dreams, worsening insomnia, fatigue and night sweats.  No longer exercising since started on the Effexor"   At this visit she had marked tachycardia with standing, her BP OK (modest elevation actually), and planned to try addition of BB (he gave her Rx for atenolol 50, metoprolol succinate 50 and bisoprolol 5 to try at half dose to see if she can can take without side effects and minimized her symptoms.  He also urged her to spend as little time in bed (horizontal) as possible and try to start to exercise with her recumbent bike.   She has done the best she has ever done in the last year.  The addition of the atenolol made a tremendous improvement.  She takes it at night to avoid worsening day time fatigu.  Unfortunately she had an insurance change that required her to change PMD and this has cause much stress and anxiety.  She says the new PMD would not refill her medicines and required her to see a physiatrist first, also feel like was mislabeled with a number of anxiety disorders that she has never been felt to have before.  She did give her a month refills on her Xanax, has been unable to get an appointment with a psychiatrist and the new PMD has stopped returning her calls. She feels  like she is losing ground on all the improvements she had made.  She has not had fainting but feels like she has started to feel her tachycardia more often particularly when bending/reaching. NOt as bad, but worries that this is a trend backwards.  Luckily she now has medicaid and can see her prior PMD for one last visit prior to the doctor leaving that practice.  A few weeks or more ago she had central CP that her husband brings up.  They called 911 and they told her her EKG vitals were OK.  In hindsight, she had eaten rand fallen asleep and when she  started belching the pain went away and feels it was GI.  Has not happened again.  I saw her 05/21/19 Her orthostatics today look good, she felt a little lightheaded with position change, though no marked change in BP or HR She has a family member with MS and her neurologist suggested CBD oil, she asks if we know if this may be helpful for POTS.  Particularly the times of increased worry/stress/ perhaps settle her nerves without the need for prescription medicines. Florinef made her BP quite high and she has not used this in a year, compression garments give her the sensation of being overheated and were counter productive having tried them all. She is not using her recumbent bike, spending a lot of time sewing, making masks for some extra income.  Spends much of her day seated.  I was nt willing to fill/manage her Effexor/Xanax and urged to follow up with her PMD, she was going to discuss further with Dr. Graciela Husbands as well as thought on CBD .  She saw Dr. Graciela Husbands 09/01/19, doing well, no changes were made  *** symptoms *** meds *** orthostatics   Past Medical History:  Diagnosis Date  . Common migraine with intractable migraine 12/19/2017  . Hypotension   . Low blood pressure   . Panic disorder 12/19/2017   "something that happens when I have a flare" regarding POTS  . POTS (postural orthostatic tachycardia syndrome)   . Tachycardia     Past Surgical History:  Procedure Laterality Date  . APPENDECTOMY    . BREAST SURGERY    . CESAREAN SECTION N/A 08/19/2014   Procedure: CESAREAN SECTION;  Surgeon: Myna Hidalgo, DO;  Location: [facility];  Service: Obstetrics;  Laterality: N/A;  . DILATION AND EVACUATION N/A 11/24/2016   Procedure: DILATATION AND EVACUATION;  Surgeon: Kirkland Hun, MD;  Location: [facility];  Service: Gynecology;  Laterality: N/A;  . FOOT SURGERY  2011 2012  . KNEE SURGERY  2001  2002  . plantar fasciitis      Current Outpatient Medications  Medication Sig Dispense Refill   . ALPRAZolam (XANAX) 0.25 MG tablet Take 1-2 tabs (0.25mg -0.50mg ) 30-60 minutes before procedure. May repeat if needed.Do not drive. 4 tablet 0  . atenolol (TENORMIN) 100 MG tablet Take 1 tablet (100 mg total) by mouth daily. 90 tablet 3  . Botulinum Toxin Type A (BOTOX) 200 units SOLR Provider to inject 155 units into the muscles of the head and neck every 3 months. Discard remainder. 1 each 1  . fludrocortisone (FLORINEF) 0.1 MG tablet Take 1 tablet (0.1 mg total) by mouth daily. 180 tablet 3  . levonorgestrel-ethinyl estradiol (SEASONALE,INTROVALE,JOLESSA) 0.15-0.03 MG tablet Take 1 tablet by mouth daily.    . meclizine (ANTIVERT) 25 MG tablet Take 25 mg by mouth 3 (three) times daily as needed for dizziness.    . Multiple  Vitamin (MULTIVITAMIN PO) Take by mouth.    . ondansetron (ZOFRAN ODT) 8 MG disintegrating tablet Take 1 tablet (8 mg total) by mouth every 8 (eight) hours as needed for nausea or vomiting. 30 tablet 0  . rizatriptan (MAXALT-MLT) 10 MG disintegrating tablet Take 1 tablet (10 mg total) by mouth as needed for migraine. May repeat in 2 hours if needed 9 tablet 11  . venlafaxine XR (EFFEXOR-XR) 150 MG 24 hr capsule TAKE 1 CAPSULE(150 MG) BY MOUTH DAILY WITH BREAKFAST 30 capsule 0  . zolpidem (AMBIEN) 10 MG tablet Take 10 mg by mouth at bedtime as needed for sleep.     No current facility-administered medications for this visit.    Allergies:   Caffeine, Ciprofloxacin, Codeine, Meperidine hcl, Ondansetron, Phenergan [promethazine], Prednisone, Septra [sulfamethoxazole-trimethoprim], and Phenergan [promethazine hcl]   Social History:  The patient  reports that she quit smoking about 6 years ago. Her smoking use included cigarettes. She has a 14.00 pack-year smoking history. She has never used smokeless tobacco. She reports previous alcohol use. She reports that she does not use drugs.   Family History:  The patient's family history includes Cancer in her maternal grandmother;  Diabetes in her maternal grandmother; Headache in her maternal great-grandmother; Heart murmur in her father.  ROS:  Please see the history of present illness.   All other systems are reviewed and otherwise negative.   PHYSICAL EXAM:  VS:  There were no vitals taken for this visit. BMI: There is no height or weight on file to calculate BMI. Well nourished, well developed, in no acute distress  HEENT: normocephalic, atraumatic  Neck: no JVD, carotid bruits or masses Cardiac: *** RRR; no significant murmurs, no rubs, or gallops Lungs:  *** CTA b/l, no wheezing, rhonchi or rales  Abd: soft, nontender MS: no deformity or atrophy Ext: *** no edema  Skin: warm and dry, no rash Neuro:  No gross deficits appreciated Psych: euthymic mood, full affect   EKG:  Not done today   Recent Labs: No results found for requested labs within last 8760 hours.  No results found for requested labs within last 8760 hours.   CrCl cannot be calculated (Patient's most recent lab result is older than the maximum 21 days allowed.).   Wt Readings from Last 3 Encounters:  04/28/20 177 lb (80.3 kg)  09/01/19 170 lb (77.1 kg)  05/21/19 169 lb (76.7 kg)     Other studies reviewed: Additional studies/records reviewed today include: summarized above  ASSESSMENT AND PLAN:  1. POTS      She seems to have made good gains with the current regime and BB addition.   ***    Current medicines are reviewed at length with the patient today.  The patient did not have any concerns regarding medicines.  Norma Fredrickson, PA-C 07/25/2020 11:26 AM     [facility] [address] [place] [address] [phone] (office)  [phone] (fax)

## 2020-07-26 ENCOUNTER — Ambulatory Visit: Payer: Medicaid Other | Admitting: Physician Assistant

## 2020-08-23 NOTE — Progress Notes (Signed)
Cardiology Office Note Date:  08/23/2020  Patient ID:  Mia Rubio, DOB 1980-10-26, MRN [id number] PCP:  Mitzi Hansen, NP  Cardiologist:  Dr. Graciela Husbands Neurology: Dr. Lucia Gaskins   Chief Complaint:  annual visit  History of Present Illness: Mia Rubio is a 40 y.o. female with history of POTS, anxiety, migraines   Dr. Graciela Husbands recaps her history  As below:  "Her dizziness in the past has been recumbent associated with normal vital signs which to me suggested a noncardiac perhaps neuropsychiatric origin. It was recommended that she be referred for consideration of these components. There also was a component of heat and orthostatic intolerance.  An abdominal binder helped considerably    She is here having lost 2 children miscarriages in the last 6 months.  The first was associated with profound and persistent bleeding and resulted in exercise intolerance bedrest and profound dizziness.  She recalls that when she had hormonal suppression of her periods she felt the best that she had in years.  She and her husband are continuing to try to have a second child.  Hence got pregnant again and she miscarried her second child about 6 months ago.   That is also the timeframe in which she began to have significantly worsened symptoms.  When seen 3/19, >>"She says she is barely able to sit up without being dizzy.  She is not able to stand well.  Fluorescent lights are bothersome.  She is unable to exercise  She is nauseated and has had significant decrease PO intake."   The symptoms have continued to worsen.     A second major event supervened about 7/19 >> she ended up with an episode of profound anxiety associated with light and sound hypersensitivity both of which have worsened since then.   This turned out to be temporally related to hormonal therapy for another attempted pregnancy   In the interim she has seen Dr. Anne Hahn who thought these may be related to migraines.  He has treated her with  Effexor at rapidly increasing doses 37.5--150.  She has a sense that her anxiety and tremulousness is much much less.  She notes however also that there have been disturbing nightmarish dreams, worsening insomnia, fatigue and night sweats.   No longer exercising since started on the Effexor"    At this visit she had marked tachycardia with standing, her BP OK (modest elevation actually), and planned to try addition of BB (he gave her Rx for atenolol 50, metoprolol succinate 50 and bisoprolol 5 to try at half dose to see if she can can take without side effects and minimized her symptoms.  He also urged her to spend as little time in bed (horizontal) as possible and try to start to exercise with her recumbent bike.  I saw her 05/21/19 Her orthostatics today look good, she felt a little lightheaded with position change, though no marked change in BP or HR She has a family member with MS and her neurologist suggested CBD oil, she asks if we know if this may be helpful for POTS.  Particularly the times of increased worry/stress/ perhaps settle her nerves without the need for prescription medicines. Florinef made her BP quite high and she has not used this in a year, compression garments give her the sensation of being overheated and were counter productive having tried them all. She is not using her recumbent bike, spending a lot of time sewing, making masks for some extra income.  Spends much  of her day seated.  I was not willing to fill/manage her Effexor/Xanax and urged to follow up with her PMD, she was going to discuss further with Dr. Graciela Husbands as well as thought on CBD .  She saw Dr. 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 She has some blips on the radar with some episodes of feeling a bit SOB or her HR up  and is able to work through them, may need to take a xanax, but is feeling really good about where she is. She is not using the florinef    Past Medical History:  Diagnosis Date   Common migraine with intractable migraine 12/19/2017   Hypotension    Low blood pressure    Panic disorder 12/19/2017   "something that happens when I have a flare" regarding POTS   POTS (postural orthostatic tachycardia syndrome)    Tachycardia     Past Surgical History:  Procedure Laterality Date   APPENDECTOMY     BREAST SURGERY     CESAREAN SECTION N/A 08/19/2014   Procedure: CESAREAN SECTION;  Surgeon: Myna Hidalgo, DO;  Location: [facility];  Service: Obstetrics;  Laterality: N/A;   DILATION AND EVACUATION N/A 11/24/2016   Procedure: DILATATION AND EVACUATION;  Surgeon: Kirkland Hun, MD;  Location: [facility];  Service: Gynecology;  Laterality: N/A;   FOOT SURGERY  2011 2012   KNEE SURGERY  2001  2002   plantar fasciitis      Current Outpatient Medications  Medication Sig Dispense Refill   ALPRAZolam (XANAX) 0.25 MG tablet Take 1-2 tabs (0.25mg -0.50mg ) 30-60 minutes before procedure. May repeat if needed.Do not drive. 4 tablet 0   atenolol (TENORMIN) 100 MG tablet Take 1 tablet (100 mg total) by mouth daily. 90 tablet 3   Botulinum Toxin Type A (BOTOX) 200 units SOLR Provider to inject 155 units into the muscles of the head and neck every 3 months. Discard remainder. 1 each 1   fludrocortisone (FLORINEF) 0.1 MG tablet Take 1 tablet (0.1 mg total) by mouth daily. 180 tablet 3   levonorgestrel-ethinyl estradiol (SEASONALE,INTROVALE,JOLESSA) 0.15-0.03 MG tablet Take 1 tablet by mouth daily.     meclizine (ANTIVERT) 25 MG tablet Take 25 mg by mouth 3 (three) times daily as needed for dizziness.     Multiple Vitamin (MULTIVITAMIN PO) Take by mouth.     ondansetron (ZOFRAN ODT) 8 MG disintegrating tablet Take 1 tablet (8 mg total) by mouth every 8 (eight) hours as needed for nausea or vomiting. 30 tablet 0    rizatriptan (MAXALT-MLT) 10 MG disintegrating tablet Take 1 tablet (10 mg total) by mouth as needed for migraine. May repeat in 2 hours if needed 9 tablet 11   venlafaxine XR (EFFEXOR-XR) 150 MG 24 hr capsule TAKE 1 CAPSULE(150 MG) BY MOUTH DAILY WITH BREAKFAST 30 capsule 0   zolpidem (AMBIEN) 10 MG tablet Take 10 mg by mouth at bedtime as needed for sleep.     No current facility-administered medications for this visit.    Allergies:   Caffeine, Ciprofloxacin, Codeine, Meperidine hcl, Ondansetron, Phenergan [promethazine], Prednisone, Septra [sulfamethoxazole-trimethoprim], and Phenergan [promethazine hcl]   Social History:  The patient  reports that she quit smoking about 6 years ago. Her smoking use included cigarettes. She has a 14.00 pack-year smoking history. She has never  used smokeless tobacco. She reports previous alcohol use. She reports that she does not use drugs.   Family History:  The patient's family history includes Cancer in her maternal grandmother; Diabetes in her maternal grandmother; Headache in her maternal great-grandmother; Heart murmur in her father.  ROS:  Please see the history of present illness.   All other systems are reviewed and otherwise negative.   PHYSICAL EXAM:  VS:  There were no vitals taken for this visit. BMI: There is no height or weight on file to calculate BMI. Well nourished, well developed, in no acute distress  HEENT: normocephalic, atraumatic  Neck: no JVD, carotid bruits or masses Cardiac: RRR; no significant murmurs, no rubs, or gallops Lungs:  CTA b/l, no wheezing, rhonchi or rales  Abd: soft, nontender MS: no deformity or atrophy Ext: no edema  Skin: warm and dry, no rash Neuro:  No gross deficits appreciated Psych: euthymic mood, full affect   EKG:  done today and reviewed by myself SR 68bpm, normal   Recent Labs: No results found for requested labs within last 8760 hours.  No results found for requested labs within last 8760  hours.   CrCl cannot be calculated (Patient's most recent lab result is older than the maximum 21 days allowed.).   Wt Readings from Last 3 Encounters:  04/28/20 177 lb (80.3 kg)  09/01/19 170 lb (77.1 kg)  05/21/19 169 lb (76.7 kg)     Other studies reviewed: Additional studies/records reviewed today include: summarized above  ASSESSMENT AND PLAN:  1. POTS     She is doing great      No changes  Back in a year, sooner if needed  Current medicines are reviewed at length with the patient today.  The patient did not have any concerns regarding medicines.  Norma Fredrickson, PA-C 08/23/2020 9:31 PM     [facility] [address] [place] [address] [phone] (office)  [phone] (fax)

## 2020-08-24 ENCOUNTER — Other Ambulatory Visit: Payer: Self-pay

## 2020-08-24 ENCOUNTER — Ambulatory Visit: Payer: Medicaid Other | Admitting: Physician Assistant

## 2020-08-24 ENCOUNTER — Encounter: Payer: Self-pay | Admitting: Physician Assistant

## 2020-08-24 VITALS — BP 108/68 | HR 68 | Ht 69.0 in | Wt 185.2 lb

## 2020-08-24 DIAGNOSIS — R002 Palpitations: Secondary | ICD-10-CM | POA: Diagnosis not present

## 2020-08-24 DIAGNOSIS — G90A Postural orthostatic tachycardia syndrome (POTS): Secondary | ICD-10-CM

## 2020-08-24 DIAGNOSIS — I498 Other specified cardiac arrhythmias: Secondary | ICD-10-CM

## 2020-08-24 NOTE — Patient Instructions (Signed)
Medication Instructions:  ° °Your physician recommends that you continue on your current medications as directed. Please refer to the Current Medication list given to you today. ° °*If you need a refill on your cardiac medications before your next appointment, please call your pharmacy* ° ° °Lab Work: NONE ORDERED  TODAY ° ° °If you have labs (blood work) drawn today and your tests are completely normal, you will receive your results only by: °MyChart Message (if you have MyChart) OR °A paper copy in the mail °If you have any lab test that is abnormal or we need to change your treatment, we will call you to review the results. ° ° °Testing/Procedures: NONE ORDERED  TODAY ° ° ° °Follow-Up: °At [facility], you and your health needs are our priority.  As part of our continuing mission to provide you with exceptional heart care, we have created designated Provider Care Teams.  These Care Teams include your primary Cardiologist (physician) and Advanced Practice Providers (APPs -  Physician Assistants and Nurse Practitioners) who all work together to provide you with the care you need, when you need it. ° °We recommend signing up for the patient portal called "MyChart".  Sign up information is provided on this After Visit Summary.  MyChart is used to connect with patients for Virtual Visits (Telemedicine).  Patients are able to view lab/test results, encounter notes, upcoming appointments, etc.  Non-urgent messages can be sent to your provider as well.   °To learn more about what you can do with MyChart, go to [email or web address].   ° °Your next appointment:   °1 month(s) ° °The format for your next appointment:   °In Person ° °Provider:   °Steven Klein, MD  ° °Other Instructions ° °

## 2020-08-30 ENCOUNTER — Telehealth: Payer: Self-pay | Admitting: Neurology

## 2020-08-30 ENCOUNTER — Encounter: Payer: Self-pay | Admitting: Neurology

## 2020-08-30 ENCOUNTER — Ambulatory Visit: Payer: Medicaid Other | Admitting: Neurology

## 2020-08-30 DIAGNOSIS — G43711 Chronic migraine without aura, intractable, with status migrainosus: Secondary | ICD-10-CM

## 2020-08-30 NOTE — Telephone Encounter (Signed)
Patient has a Botox appointment today. I called her insurance [facility] [employer]) at [phone] and spoke with Akima to check CPT codes [id number] and [id number] to see if PA is needed. Akima states there is no PA required as long as provider is in-network. Reference [phone].   I called [facility] at [phone] and spoke with Herbert Seta to make sure no Berkley Harvey is needed there either. She states that the last PA was done in April and a new one will need to be obtained by calling [phone]. Diane states that there is an approval on file that does not expire until 12/01/20. [id number].

## 2020-08-30 NOTE — Progress Notes (Signed)
Botox-200unitsx1 vials Lot: [id number] Expiration: 12/2022 NDC: 3643-8377-93 [id number]  0.9% Sodium Chloride- 32mL total Lot: [id number] Expiration: 02/2022 NDC: 8833-7445-14  Dx: G43.711 SP

## 2020-08-30 NOTE — Progress Notes (Signed)
Consent Form Botulism Toxin Injection For Chronic Migraine  08/30/2020: She is doing very well, second botox. She was diagnosed with POTS and is being treated and feels much better.    Reviewed orally with patient, additionally signature is on file:  Botulism toxin has been approved by the Federal drug administration for treatment of chronic migraine. Botulism toxin does not cure chronic migraine and it may not be effective in some patients.  The administration of botulism toxin is accomplished by injecting a small amount of toxin into the muscles of the neck and head. Dosage must be titrated for each individual. Any benefits resulting from botulism toxin tend to wear off after 3 months with a repeat injection required if benefit is to be maintained. Injections are usually done every 3-4 months with maximum effect peak achieved by about 2 or 3 weeks. Botulism toxin is expensive and you should be sure of what costs you will incur resulting from the injection.  The side effects of botulism toxin use for chronic migraine may include:   -Transient, and usually mild, facial weakness with facial injections  -Transient, and usually mild, head or neck weakness with head/neck injections  -Reduction or loss of forehead facial animation due to forehead muscle weakness  -Eyelid drooping  -Dry eye  -Pain at the site of injection or bruising at the site of injection  -Double vision  -Potential unknown long term risks  Contraindications: You should not have Botox if you are pregnant, nursing, allergic to albumin, have an infection, skin condition, or muscle weakness at the site of the injection, or have myasthenia gravis, Lambert-Eaton syndrome, or ALS.  It is also possible that as with any injection, there may be an allergic reaction or no effect from the medication. Reduced effectiveness after repeated injections is sometimes seen and rarely infection at the injection site may occur. All care will be taken  to prevent these side effects. If therapy is given over a long time, atrophy and wasting in the muscle injected may occur. Occasionally the patient's become refractory to treatment because they develop antibodies to the toxin. In this event, therapy needs to be modified.  I have read the above information and consent to the administration of botulism toxin.    BOTOX PROCEDURE NOTE FOR MIGRAINE HEADACHE    Contraindications and precautions discussed with patient(above). Aseptic procedure was observed and patient tolerated procedure. Procedure performed by Dr. Artemio Aly  The condition has existed for more than 6 months, and pt does not have a diagnosis of ALS, Myasthenia Gravis or Lambert-Eaton Syndrome.  Risks and benefits of injections discussed and pt agrees to proceed with the procedure.  Written consent obtained  These injections are medically necessary. Pt  receives good benefits from these injections. These injections do not cause sedations or hallucinations which the oral therapies may cause.  Description of procedure:  The patient was placed in a sitting position. The standard protocol was used for Botox as follows, with 5 units of Botox injected at each site:   -Procerus muscle, midline injection  -Corrugator muscle, bilateral injection  -Frontalis muscle, bilateral injection, with 2 sites each side, medial injection was performed in the upper one third of the frontalis muscle, in the region vertical from the medial inferior edge of the superior orbital rim. The lateral injection was again in the upper one third of the forehead vertically above the lateral limbus of the cornea, 1.5 cm lateral to the medial injection site.  -Temporalis muscle injection, 4  sites, bilaterally. The first injection was 3 cm above the tragus of the ear, second injection site was 1.5 cm to 3 cm up from the first injection site in line with the tragus of the ear. The third injection site was 1.5-3 cm  forward between the first 2 injection sites. The fourth injection site was 1.5 cm posterior to the second injection site.   -Occipitalis muscle injection, 3 sites, bilaterally. The first injection was done one half way between the occipital protuberance and the tip of the mastoid process behind the ear. The second injection site was done lateral and superior to the first, 1 fingerbreadth from the first injection. The third injection site was 1 fingerbreadth superiorly and medially from the first injection site.  -Cervical paraspinal muscle injection, 2 sites, bilateral knee first injection site was 1 cm from the midline of the cervical spine, 3 cm inferior to the lower border of the occipital protuberance. The second injection site was 1.5 cm superiorly and laterally to the first injection site.  -Trapezius muscle injection was performed at 3 sites, bilaterally. The first injection site was in the upper trapezius muscle halfway between the inflection point of the neck, and the acromion. The second injection site was one half way between the acromion and the first injection site. The third injection was done between the first injection site and the inflection point of the neck.   Will return for repeat injection in 3 months.   200 units of Botox was used, any Botox not injected was wasted. The patient tolerated the procedure well, there were no complications of the above procedure.

## 2020-09-11 ENCOUNTER — Other Ambulatory Visit: Payer: Self-pay | Admitting: Neurology

## 2020-09-11 DIAGNOSIS — R9082 White matter disease, unspecified: Secondary | ICD-10-CM

## 2020-09-13 ENCOUNTER — Telehealth: Payer: Self-pay | Admitting: Neurology

## 2020-09-13 NOTE — Telephone Encounter (Signed)
Pt thinks she is having a reaction to the venlafaxine XR (EFFEXOR-XR) 150 MG 24 hr capsule. She states if she forgets to take the medication she has a shocking sensation in her face. She is having a hard time keeping her eyes open today. She wants to know does the dosage need to be increased. However she then stated that it might not even be the medication that is causing the reaction. Pt is requesting a call back.

## 2020-09-14 ENCOUNTER — Other Ambulatory Visit: Payer: Self-pay

## 2020-09-14 ENCOUNTER — Ambulatory Visit
Admission: RE | Admit: 2020-09-14 | Discharge: 2020-09-14 | Disposition: A | Discharge status: Home / Self Care | Payer: Medicaid Other | Source: Ambulatory Visit | Attending: Neurology | Admitting: Neurology

## 2020-09-14 VITALS — BP 118/76 | HR 69

## 2020-09-14 DIAGNOSIS — R9082 White matter disease, unspecified: Secondary | ICD-10-CM

## 2020-09-14 NOTE — Discharge Instructions (Signed)

## 2020-09-14 NOTE — Progress Notes (Signed)
Blood drawn from left hand to be sent with LP. 1 successful attempt.

## 2020-09-14 NOTE — Telephone Encounter (Signed)
I called pt back.  LMVM for her to return call. Pt received BOTOX 08-30-20.

## 2020-09-14 NOTE — Telephone Encounter (Signed)
I called pt and was not able to LVM as VM full.

## 2020-09-20 LAB — CNS IGG SYNTHESIS RATE, CSF+BLOOD
Albumin Serum: 3.7 g/dL (ref 3.5–5.2)
Albumin, CSF: 15.2 mg/dL (ref 8.0–42.0)
CNS-IgG Synthesis Rate: -1.7 mg/24 h (ref <=3.3)
IgG (Immunoglobin G), Serum: 818 mg/dL (ref 600–1640)
IgG Total CSF: 1.8 mg/dL (ref 0.8–7.7)
IgG-Index: 0.54 (ref <0.66)

## 2020-09-20 LAB — PROTEIN, CSF: Total Protein, CSF: 29 mg/dL (ref 15–45)

## 2020-09-20 LAB — CSF CELL COUNT WITH DIFFERENTIAL
RBC Count, CSF: 2 cells/uL — ABNORMAL HIGH
WBC, CSF: 2 cells/uL (ref 0–5)

## 2020-09-20 LAB — OLIGOCLONAL BANDS, CSF + SERM

## 2020-09-20 LAB — GLUCOSE, CSF: Glucose, CSF: 63 mg/dL (ref 40–80)

## 2020-12-01 ENCOUNTER — Telehealth: Payer: Self-pay | Admitting: Neurology

## 2020-12-01 NOTE — Telephone Encounter (Signed)
Patient has a Botox appointment 10/11. Received (1) 200 unit vial of Botox from [facility]. Obtained PA from [facility] for the Botox. PA was approved. [id number] (11/30/20- 11/30/21).

## 2020-12-06 ENCOUNTER — Ambulatory Visit: Payer: Medicaid Other | Admitting: Neurology

## 2020-12-06 DIAGNOSIS — G43709 Chronic migraine without aura, not intractable, without status migrainosus: Secondary | ICD-10-CM | POA: Diagnosis not present

## 2020-12-06 MED ORDER — MECLIZINE HCL 25 MG PO TABS: 25.0000 mg | ORAL_TABLET | Freq: Three times a day (TID) | ORAL | 11 refills | Status: DC | PRN

## 2020-12-06 MED ORDER — ONDANSETRON 8 MG PO TBDP: 8.0000 mg | ORAL_TABLET | Freq: Three times a day (TID) | ORAL | 11 refills | Status: DC | PRN

## 2020-12-06 MED ORDER — RIZATRIPTAN BENZOATE 10 MG PO TBDP
10.0000 mg | ORAL_TABLET | ORAL | 11 refills | Status: DC | PRN
Start: 2020-12-06 — End: 2022-02-08

## 2020-12-06 MED ORDER — IBUPROFEN 800 MG PO TABS
800.0000 mg | ORAL_TABLET | Freq: Three times a day (TID) | ORAL | 11 refills | Status: DC | PRN
Start: 2020-12-06 — End: 2021-12-14

## 2020-12-06 MED ORDER — VENLAFAXINE HCL ER 150 MG PO CP24
ORAL_CAPSULE | ORAL | 3 refills | Status: DC
Start: 2020-12-06 — End: 2021-11-29

## 2020-12-06 NOTE — Addendum Note (Signed)
Addended by: Bertram Savin on: 12/06/2020 05:03 PM   Modules accepted: Orders

## 2020-12-06 NOTE — Progress Notes (Signed)
Consent Form Botulism Toxin Injection For Chronic Migraine  12/06/2020: She is doing very well, third botox. She was diagnosed with POTS and is being treated and feels much better. 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 +a.    Reviewed orally with patient, additionally signature is on file:  Botulism toxin has been approved by the Federal drug administration for treatment of chronic migraine. Botulism toxin does not cure chronic migraine and it may not be effective in some patients.  The administration of botulism toxin is accomplished by injecting a small amount of toxin into the muscles of the neck and head. Dosage must be titrated for each individual. Any benefits resulting from botulism toxin tend to wear off after 3 months with a repeat injection required if benefit is to be maintained. Injections are usually done every 3-4 months with maximum effect peak achieved by about 2 or 3 weeks. Botulism toxin is expensive and you should be sure of what costs you will incur resulting from the injection.  The side effects of botulism toxin use for chronic migraine may include:   -Transient, and usually mild, facial weakness with facial injections  -Transient, and usually mild, head or neck weakness with head/neck injections  -Reduction or loss of forehead facial animation due to forehead muscle weakness  -Eyelid drooping  -Dry eye  -Pain at the site of injection or bruising at the site of injection  -Double vision  -Potential unknown long term risks  Contraindications: You should not have Botox if you are pregnant, nursing, allergic to albumin, have an infection, skin condition, or muscle weakness at the site of the injection, or have myasthenia gravis, Lambert-Eaton syndrome, or ALS.  It is also possible that as with any injection, there may be an allergic reaction or no effect from the medication. Reduced effectiveness after repeated injections is sometimes seen and rarely infection at the  injection site may occur. All care will be taken to prevent these side effects. If therapy is given over a long time, atrophy and wasting in the muscle injected may occur. Occasionally the patient's become refractory to treatment because they develop antibodies to the toxin. In this event, therapy needs to be modified.  I have read the above information and consent to the administration of botulism toxin.    BOTOX PROCEDURE NOTE FOR MIGRAINE HEADACHE    Contraindications and precautions discussed with patient(above). Aseptic procedure was observed and patient tolerated procedure. Procedure performed by Dr. Artemio Aly  The condition has existed for more than 6 months, and pt does not have a diagnosis of ALS, Myasthenia Gravis or Lambert-Eaton Syndrome.  Risks and benefits of injections discussed and pt agrees to proceed with the procedure.  Written consent obtained  These injections are medically necessary. Pt  receives good benefits from these injections. These injections do not cause sedations or hallucinations which the oral therapies may cause.  Description of procedure:  The patient was placed in a sitting position. The standard protocol was used for Botox as follows, with 5 units of Botox injected at each site:   -Procerus muscle, midline injection  -Corrugator muscle, bilateral injection  -Frontalis muscle, bilateral injection, with 2 sites each side, medial injection was performed in the upper one third of the frontalis muscle, in the region vertical from the medial inferior edge of the superior orbital rim. The lateral injection was again in the upper one third of the forehead vertically above the lateral limbus of the cornea, 1.5 cm lateral to the  medial injection site.  -Temporalis muscle injection, 4 sites, bilaterally. The first injection was 3 cm above the tragus of the ear, second injection site was 1.5 cm to 3 cm up from the first injection site in line with the tragus of  the ear. The third injection site was 1.5-3 cm forward between the first 2 injection sites. The fourth injection site was 1.5 cm posterior to the second injection site.   -Occipitalis muscle injection, 3 sites, bilaterally. The first injection was done one half way between the occipital protuberance and the tip of the mastoid process behind the ear. The second injection site was done lateral and superior to the first, 1 fingerbreadth from the first injection. The third injection site was 1 fingerbreadth superiorly and medially from the first injection site.  -Cervical paraspinal muscle injection, 2 sites, bilateral knee first injection site was 1 cm from the midline of the cervical spine, 3 cm inferior to the lower border of the occipital protuberance. The second injection site was 1.5 cm superiorly and laterally to the first injection site.  -Trapezius muscle injection was performed at 3 sites, bilaterally. The first injection site was in the upper trapezius muscle halfway between the inflection point of the neck, and the acromion. The second injection site was one half way between the acromion and the first injection site. The third injection was done between the first injection site and the inflection point of the neck.   Will return for repeat injection in 3 months.   155 units of Botox was used, 45U Botox not injected was wasted. The patient tolerated the procedure well, there were no complications of the above procedure.

## 2020-12-06 NOTE — Progress Notes (Signed)
Botox- 200 units x 1 vial Lot: [id number] Expiration: 04/2023 NDC: 3254-9826-41  Bacteriostatic 0.9% Sodium Chloride- 73mL total Lot: [id number] Expiration: 01/2022 NDC: 7680-8811-03  Dx: P59.458 S/P

## 2021-02-11 ENCOUNTER — Other Ambulatory Visit: Payer: Self-pay | Admitting: Neurology

## 2021-02-23 ENCOUNTER — Other Ambulatory Visit: Payer: Self-pay

## 2021-02-23 MED ORDER — ATENOLOL 100 MG PO TABS
100.0000 mg | ORAL_TABLET | Freq: Every day | ORAL | 1 refills | Status: DC
Start: 2021-02-23 — End: 2021-08-24

## 2021-03-07 ENCOUNTER — Ambulatory Visit: Payer: Medicaid Other | Admitting: Neurology

## 2021-03-07 DIAGNOSIS — G43709 Chronic migraine without aura, not intractable, without status migrainosus: Secondary | ICD-10-CM | POA: Diagnosis not present

## 2021-03-07 NOTE — Progress Notes (Signed)
Botox- 200 units x 1 vial ?Lot: [id number] ?Expiration: 05/2023 ?NDC: 0023-3921-02 ? ?Bacteriostatic 0.9% Sodium Chloride- 4mL total ?Lot: [id number] ?Expiration: 09/27/2022 ?NDC: 0409-1966-02 ? ?Dx: G43.709 ?S/P  ?

## 2021-03-07 NOTE — Progress Notes (Signed)
Consent Form Botulism Toxin Injection For Chronic Migraine  03/07/2020: She is doing very well, 4th botox. She was diagnosed with POTS and is being treated and feels much better. 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 +a. >> 70% improvement in migraines.   Reviewed orally with patient, additionally signature is on file:  Botulism toxin has been approved by the Federal drug administration for treatment of chronic migraine. Botulism toxin does not cure chronic migraine and it may not be effective in some patients.  The administration of botulism toxin is accomplished by injecting a small amount of toxin into the muscles of the neck and head. Dosage must be titrated for each individual. Any benefits resulting from botulism toxin tend to wear off after 3 months with a repeat injection required if benefit is to be maintained. Injections are usually done every 3-4 months with maximum effect peak achieved by about 2 or 3 weeks. Botulism toxin is expensive and you should be sure of what costs you will incur resulting from the injection.  The side effects of botulism toxin use for chronic migraine may include:   -Transient, and usually mild, facial weakness with facial injections  -Transient, and usually mild, head or neck weakness with head/neck injections  -Reduction or loss of forehead facial animation due to forehead muscle weakness  -Eyelid drooping  -Dry eye  -Pain at the site of injection or bruising at the site of injection  -Double vision  -Potential unknown long term risks  Contraindications: You should not have Botox if you are pregnant, nursing, allergic to albumin, have an infection, skin condition, or muscle weakness at the site of the injection, or have myasthenia gravis, Lambert-Eaton syndrome, or ALS.  It is also possible that as with any injection, there may be an allergic reaction or no effect from the medication. Reduced effectiveness after repeated injections is sometimes seen and  rarely infection at the injection site may occur. All care will be taken to prevent these side effects. If therapy is given over a long time, atrophy and wasting in the muscle injected may occur. Occasionally the patient's become refractory to treatment because they develop antibodies to the toxin. In this event, therapy needs to be modified.  I have read the above information and consent to the administration of botulism toxin.    BOTOX PROCEDURE NOTE FOR MIGRAINE HEADACHE    Contraindications and precautions discussed with patient(above). Aseptic procedure was observed and patient tolerated procedure. Procedure performed by Dr. Artemio Aly  The condition has existed for more than 6 months, and pt does not have a diagnosis of ALS, Myasthenia Gravis or Lambert-Eaton Syndrome.  Risks and benefits of injections discussed and pt agrees to proceed with the procedure.  Written consent obtained  These injections are medically necessary. Pt  receives good benefits from these injections. These injections do not cause sedations or hallucinations which the oral therapies may cause.  Description of procedure:  The patient was placed in a sitting position. The standard protocol was used for Botox as follows, with 5 units of Botox injected at each site:   -Procerus muscle, midline injection  -Corrugator muscle, bilateral injection  -Frontalis muscle, bilateral injection, with 2 sites each side, medial injection was performed in the upper one third of the frontalis muscle, in the region vertical from the medial inferior edge of the superior orbital rim. The lateral injection was again in the upper one third of the forehead vertically above the lateral limbus of the cornea, 1.5  cm lateral to the medial injection site.  -Temporalis muscle injection, 4 sites, bilaterally. The first injection was 3 cm above the tragus of the ear, second injection site was 1.5 cm to 3 cm up from the first injection site in  line with the tragus of the ear. The third injection site was 1.5-3 cm forward between the first 2 injection sites. The fourth injection site was 1.5 cm posterior to the second injection site.   -Occipitalis muscle injection, 3 sites, bilaterally. The first injection was done one half way between the occipital protuberance and the tip of the mastoid process behind the ear. The second injection site was done lateral and superior to the first, 1 fingerbreadth from the first injection. The third injection site was 1 fingerbreadth superiorly and medially from the first injection site.  -Cervical paraspinal muscle injection, 2 sites, bilateral knee first injection site was 1 cm from the midline of the cervical spine, 3 cm inferior to the lower border of the occipital protuberance. The second injection site was 1.5 cm superiorly and laterally to the first injection site.  -Trapezius muscle injection was performed at 3 sites, bilaterally. The first injection site was in the upper trapezius muscle halfway between the inflection point of the neck, and the acromion. The second injection site was one half way between the acromion and the first injection site. The third injection was done between the first injection site and the inflection point of the neck.   Will return for repeat injection in 3 months.   155 units of Botox was used, 45U Botox not injected was wasted. The patient tolerated the procedure well, there were no complications of the above procedure.

## 2021-04-16 ENCOUNTER — Other Ambulatory Visit: Payer: Self-pay | Admitting: Neurology

## 2021-05-15 ENCOUNTER — Telehealth: Payer: Self-pay | Admitting: Neurology

## 2021-05-15 MED ORDER — BOTOX 200 UNITS IJ SOLR: INTRAMUSCULAR | 1 refills | Status: DC

## 2021-05-15 NOTE — Telephone Encounter (Signed)
Please send Botox Rx to [facility]. 

## 2021-05-30 ENCOUNTER — Ambulatory Visit: Payer: Medicaid Other | Admitting: Adult Health

## 2021-06-06 ENCOUNTER — Telehealth: Payer: Self-pay | Admitting: Neurology

## 2021-06-06 ENCOUNTER — Other Ambulatory Visit: Payer: Self-pay | Admitting: Neurology

## 2021-06-06 DIAGNOSIS — G43709 Chronic migraine without aura, not intractable, without status migrainosus: Secondary | ICD-10-CM

## 2021-06-06 DIAGNOSIS — G43109 Migraine with aura, not intractable, without status migrainosus: Secondary | ICD-10-CM

## 2021-06-06 MED ORDER — "BD SAFETYGLIDE SYRINGE/NEEDLE 25G X 1"" 3 ML MISC": 5 refills | Status: DC

## 2021-06-06 MED ORDER — KETOROLAC TROMETHAMINE 60 MG/2ML IM SOLN: INTRAMUSCULAR | 4 refills | Status: DC

## 2021-06-06 MED ORDER — UBRELVY 100 MG PO TABS: 100.0000 mg | ORAL_TABLET | ORAL | 11 refills | Status: DC | PRN

## 2021-06-06 MED ORDER — NARATRIPTAN HCL 2.5 MG PO TABS: 2.5000 mg | ORAL_TABLET | ORAL | 0 refills | Status: DC | PRN

## 2021-06-06 NOTE — Telephone Encounter (Signed)
Patient called with a recurring headache for 4 days.  She would take the Maxalt but the headache would return.  I prescribed her Amerge (she has already tried Maxalt, sumatriptan and multiple other triptans).  I also prescribed her Toradol injections because she had to go to her primary care office today and get 1 in order to terminate the headache.  If we could please set up a nurse appointment to teach her how to draw it up and injected that would be great.  Please call patient, alternatively she may be coming in next week for Botox we may be able to train her then as well. ?

## 2021-06-06 NOTE — Telephone Encounter (Signed)
Patient has tried maxalt mutiple times, tylenol, ibuprofen, benadryl, xanax still severe migraine of 4 days. Can you call her  tomorroe and see if we can get her in for a migraie cocktail of toradol, depacon, 250mg  of steroids and a bag of fluids please? thanks ?

## 2021-06-07 NOTE — Telephone Encounter (Signed)
Please call patient first. We can potentially bring her to the infusion suite in the afternoon today for Depacon, solumedrol (please double check prednisone allergy listed?), toradol and nausea med if needed.  ?

## 2021-06-07 NOTE — Telephone Encounter (Signed)
I called pt and LMVM for her to call me back.  I relayed that Dr. Jaynee Eagles out of office, but she messaged about instruction on Toradol injection (pt has appt 06-14-2021 for botox do then?) also about infusion.  Wanted to see how she is and then go from there relating to infusion.  ?

## 2021-06-07 NOTE — Telephone Encounter (Signed)
LMVM for pt to return call about infusion.  (2nd attempt).   ?

## 2021-06-07 NOTE — Telephone Encounter (Signed)
See other phone note

## 2021-06-07 NOTE — Telephone Encounter (Signed)
Pts  husband called. Pt did not sleep all night, was resting now.  She stated that she can come 1400 for infusion.  She has never had before.  She cannot take oral prednisone, but has done ok on prednisone injection.  Order for depacon 1 gm IV in 19ml over 5 minutes, solumedrol 125mg  IVP 50 ml ns over 2 min, toradol 30 IVP over 15 secs, and compazine nausea 10mg  IVP.  Should take over about 30 min, but she will ask intrafusion when she arrives.  She did have toradol injection yesterday by pcp.  She had Csec is not pregnant,.  Signed by Foy Gu[place],  Dr. Rexene Alberts.  Orders given to intrafusion Gilmore Laroche RN.  She has level 3 headache, but can increase once up moving around.  Level 9 was worst.   ?

## 2021-06-07 NOTE — Telephone Encounter (Signed)
Pt arrived at 1400 for infusion.  Level 7 when arrived left was a 2.  She felt much better.  Appreciated.  ?

## 2021-06-14 ENCOUNTER — Ambulatory Visit: Payer: Medicaid Other | Admitting: Adult Health

## 2021-06-14 ENCOUNTER — Encounter: Payer: Self-pay | Admitting: Adult Health

## 2021-06-14 ENCOUNTER — Other Ambulatory Visit (HOSPITAL_COMMUNITY): Payer: Self-pay

## 2021-06-14 DIAGNOSIS — G43109 Migraine with aura, not intractable, without status migrainosus: Secondary | ICD-10-CM | POA: Diagnosis not present

## 2021-06-14 MED ORDER — "BD LUER-LOK SYRINGE 25G X 1"" 3 ML MISC"
5 refills | Status: DC
  Filled 2021-06-14: qty 4, 4d supply, fill #0

## 2021-06-14 MED ORDER — KETOROLAC TROMETHAMINE 60 MG/2ML IM SOLN
INTRAMUSCULAR | 4 refills | Status: DC
  Filled 2021-06-14: qty 4, 30d supply, fill #0

## 2021-06-14 NOTE — Progress Notes (Signed)
? ? ?06/14/2021: Reports botox working well. Two weeks late for botox so she has had more migraines. 1/ week migraine when taking botox consistently. Never severe.  ? ? ?BOTOX PROCEDURE NOTE FOR MIGRAINE HEADACHE ? ? ? ?Contraindications and precautions discussed with patient(above). Aseptic procedure was observed and patient tolerated procedure. Procedure performed by Butch Penny, NP ? ?The condition has existed for more than 6 months, and pt does not have a diagnosis of ALS, Myasthenia Gravis or Lambert-Eaton Syndrome.  Risks and benefits of injections discussed and pt agrees to proceed with the procedure.  Written consent obtained ? ?These injections are medically necessary. These injections do not cause sedations or hallucinations which the oral therapies may cause. ? ?Indication/Diagnosis: chronic migraine ?JYNWG(N5621) injection was performed according to protocol by [employer]. 200 units of BOTOX was dissolved into 4 cc NS.   ?NDC: 4057332866 ? ?Type of toxin: Botox ? ?Botox- 200 units x 1 vial ?Lot: [id number] ?Expiration: 06/2023 ?NDC: 1324-4010-27 ?  ?0.9% Sodium Chloride- 47mL total ?Lot: [id number] ?Expiration: 03/29/2022 ?NDC: 4034-7425-95 ?  ?Dx: G38.756 ? ?Description of procedure: ? ?The patient was placed in a sitting position. The standard protocol was used for Botox as follows, with 5 units of Botox injected at each site: ? ? ?-Procerus muscle, midline injection ? ?-Corrugator muscle, bilateral injection ? ?-Frontalis muscle, bilateral injection, with 2 sites each side, medial injection was performed in the upper one third of the frontalis muscle, in the region vertical from the medial inferior edge of the superior orbital rim. The lateral injection was again in the upper one third of the forehead vertically above the lateral limbus of the cornea, 1.5 cm lateral to the medial injection site. ? ?-Temporalis muscle injection, 4 sites, bilaterally. The first injection was 3 cm above the tragus of the ear,  second injection site was 1.5 cm to 3 cm up from the first injection site in line with the tragus of the ear. The third injection site was 1.5-3 cm forward between the first 2 injection sites. The fourth injection site was 1.5 cm posterior to the second injection site. ? ?-Occipitalis muscle injection, 3 sites, bilaterally. The first injection was done one half way between the occipital protuberance and the tip of the mastoid process behind the ear. The second injection site was done lateral and superior to the first, 1 fingerbreadth from the first injection. The third injection site was 1 fingerbreadth superiorly and medially from the first injection site. ? ?-Cervical paraspinal muscle injection, 2 sites, bilateral knee first injection site was 1 cm from the midline of the cervical spine, 3 cm inferior to the lower border of the occipital protuberance. The second injection site was 1.5 cm superiorly and laterally to the first injection site. ? ?-Trapezius muscle injection was performed at 3 sites, bilaterally. The first injection site was in the upper trapezius muscle halfway between the inflection point of the neck, and the acromion. The second injection site was one half way between the acromion and the first injection site. The third injection was done between the first injection site and the inflection point of the neck. ? ? ?Will return for repeat injection in 3 months. ? ? ?A 200 units of Botox was used, 155 units were injected, the rest of the Botox was wasted. The patient tolerated the procedure well, there were no complications of the above procedure. ? ?Butch Penny, MSN, NP-C 06/14/2021, 11:42 AM ?[facility] ?[address], [address] ?[place], [place] [address] ?[phone]  [phone] ? ?

## 2021-06-14 NOTE — Progress Notes (Signed)
Botox- 200 units x 1 vial ?Lot: [id number] ?Expiration: 06/2023 ?NDC: 1696-7893-81 ? ?0.9% Sodium Chloride- 71mL total ?Lot: [id number] ?Expiration: 03/29/2022 ?NDC: 2585-2778-24 ? ?Dx: M35.361 ?S/P  ? ?

## 2021-06-20 NOTE — Telephone Encounter (Signed)
Received (1) 200 unit vials of Botox from [facility]. ?

## 2021-06-28 ENCOUNTER — Ambulatory Visit (INDEPENDENT_AMBULATORY_CARE_PROVIDER_SITE_OTHER): Payer: Medicaid Other | Admitting: Internal Medicine

## 2021-06-28 ENCOUNTER — Encounter: Payer: Self-pay | Admitting: Internal Medicine

## 2021-06-28 VITALS — BP 118/82 | HR 75 | Ht 69.0 in | Wt 191.0 lb

## 2021-06-28 DIAGNOSIS — G90A Postural orthostatic tachycardia syndrome (POTS): Secondary | ICD-10-CM | POA: Diagnosis not present

## 2021-06-28 NOTE — Patient Instructions (Addendum)
Medication Instructions:  ?Your physician recommends that you continue on your current medications as directed. Please refer to the Current Medication list given to you today. ? ?*If you need a refill on your cardiac medications before your next appointment, please call your pharmacy* ? ? ?Lab Work: ?None ordered. ? ?If you have labs (blood work) drawn today and your tests are completely normal, you will receive your results only by: ?MyChart Message (if you have MyChart) OR ?A paper copy in the mail ?If you have any lab test that is abnormal or we need to change your treatment, we will call you to review the results. ? ? ?Testing/Procedures: ?None ordered. ? ? ? ?Follow-Up: ?At [facility], you and your health needs are our priority.  As part of our continuing mission to provide you with exceptional heart care, we have created designated Provider Care Teams.  These Care Teams include your primary Cardiologist (physician) and Advanced Practice Providers (APPs -  Physician Assistants and Nurse Practitioners) who all work together to provide you with the care you need, when you need it. ? ?We recommend signing up for the patient portal called "MyChart".  Sign up information is provided on this After Visit Summary.  MyChart is used to connect with patients for Virtual Visits (Telemedicine).  Patients are able to view lab/test results, encounter notes, upcoming appointments, etc.  Non-urgent messages can be sent to your provider as well.   ?To learn more about what you can do with MyChart, go to [email or web address].   ? ?Your next appointment:   ?12 months with Dr Graciela Husbands ? ?Other Instructions: ?If you are unable to capture the abnormal heart rate please send Dr Graciela Husbands a MyChart message next week and we can order the even recorder as it is covered by Medicaid according to our monitor tech. ?Important Information About Sugar ? ? ? ? ?  ?

## 2021-06-28 NOTE — Progress Notes (Signed)
And 4-second pauses and A-fib are okay anyway but they were all in the middle of the ? ? ? ? ?Patient Care Team: ?Carolee Rota, NP as PCP - General (Nurse Practitioner) ?Antony Blackbird, MD as Referring Physician (Family Medicine) ? ? ?HPI ? ?Mia Rubio is a 41 y.o. female ?Seen in follow-up POTS ? ?Her dizziness in the past has been recumbent associated with normal vital signs which to me suggested a noncardiac perhaps neuropsychiatric origin. It was recommended that she be referred for consideration of these components. There also was a component of heat and orthostatic intolerance.  An abdominal binder helped considerably  ?  ?She is here having lost 2 children miscarriages in the last 6 months.  The first was associated with profound and persistent bleeding and resulted in exercise intolerance bedrest and profound dizziness.  She recalls that when she had hormonal suppression of her periods she felt the best that she had in years.  She and her husband are continuing to try to have a second child.  Hence got pregnant again and she miscarried her second child about 6 months ago. ? ?That is also the timeframe in which she began to have significantly worsened symptoms.  When seen 3/19, ?>>"She says she is barely able to sit up without being dizzy.  She is not able to stand well.  Fluorescent lights are bothersome.  She is unable to exercise  She is nauseated and has had significant decrease PO intake." ? ?The symptoms have continued to worsen.   ? ?A second major event supervened about 7/19 >> she ended up with an episode of profound anxiety associated with light and sound hypersensitivity both of which have worsened since then.   This turned out to be temporally related to hormonal therapy for another attempted pregnancy ? ?She has been exceedingly better for the most part over the last couple of years being treated with atenolol, venlafaxine as well as the benzodiazepine. ? ?He has had menses suppression, had  a significant period problem about 2 months ago and since then has had abrupt onset offset tachypalpitations lasting minutes to hours every couple of nights.  Looking at her Apple watch she goes from 70-170 and then abruptly goes back down to 70 or so.  She has tried to record the rhythm but has thus far been unsuccessful in doing so; it is frog positive.  Shortness of breath and lightheadedness ? ?  ? ?Date Cr K Hgb  ?3/21  0.81   4.3   ?      ? ? ? ? ? ?Past Medical History:  ?Diagnosis Date  ? Common migraine with intractable migraine 12/19/2017  ? Hypotension   ? Low blood pressure   ? Panic disorder 12/19/2017  ? "something that happens when I have a flare" regarding POTS  ? POTS (postural orthostatic tachycardia syndrome)   ? Tachycardia   ? ? ?Past Surgical History:  ?Procedure Laterality Date  ? APPENDECTOMY    ? BREAST SURGERY    ? CESAREAN SECTION N/A 08/19/2014  ? Procedure: CESAREAN SECTION;  Surgeon: Janyth Pupa, DO;  Location: [facility];  Service: Obstetrics;  Laterality: N/A;  ? DILATION AND EVACUATION N/A 11/24/2016  ? Procedure: DILATATION AND EVACUATION;  Surgeon: Ena Dawley, MD;  Location: [facility];  Service: Gynecology;  Laterality: N/A;  ? FOOT SURGERY  2011 2012  ? KNEE SURGERY  2001  2002  ? plantar fasciitis    ? ? ?Current Outpatient Medications  ?  Medication Sig Dispense Refill  ? ALPRAZolam (XANAX XR) 1 MG 24 hr tablet Take 1 mg by mouth every morning. Take 1 mg by mouth every morning.    ? atenolol (TENORMIN) 100 MG tablet Take 1 tablet (100 mg total) by mouth daily. 90 tablet 1  ? Botulinum Toxin Type A (BOTOX) 200 units SOLR PROVIDER TO INJECT 155 UNITS INTO THE MUSCLES OF THE HEAD AND NECK EVERY 12 WEEKS. DISCARD REMAINDER. 1 each 1  ? buPROPion (WELLBUTRIN SR) 150 MG 12 hr tablet Take 150 mg by mouth every morning.    ? dicyclomine (BENTYL) 20 MG tablet Take 20 mg by mouth as needed. Take 20 mg by mouth as needed.    ? ibuprofen (ADVIL) 800 MG tablet Take 1 tablet (800 mg total) by  mouth 3 (three) times daily as needed. Take 800 mg by mouth 3 (three) times daily as needed. 90 tablet 11  ? ketorolac (TORADOL) 60 MG/2ML SOLN injection Inject 1-29ml (30-60mg ) intramuscularly at onset of migraine. May repeat in 6 hours. Max twice a day and 4 days per month. 10 mL 4  ? levonorgestrel-ethinyl estradiol (SEASONALE,INTROVALE,JOLESSA) 0.15-0.03 MG tablet Take 1 tablet by mouth daily.    ? meclizine (ANTIVERT) 25 MG tablet Take 1 tablet (25 mg total) by mouth 3 (three) times daily as needed for dizziness. 30 tablet 11  ? Multiple Vitamin (MULTIVITAMIN PO) Take by mouth.    ? ondansetron (ZOFRAN ODT) 8 MG disintegrating tablet Take 1 tablet (8 mg total) by mouth every 8 (eight) hours as needed for nausea or vomiting. 30 tablet 11  ? rizatriptan (MAXALT-MLT) 10 MG disintegrating tablet Take 1 tablet (10 mg total) by mouth as needed for migraine. May repeat in 2 hours if needed 9 tablet 11  ? SYRINGE-NEEDLE, DISP, 3 ML (B-D 3CC LUER-LOK SYR 25GX1") 25G X 1" 3 ML MISC Use with Toradol 4 each 5  ? venlafaxine XR (EFFEXOR-XR) 150 MG 24 hr capsule TAKE 1 CAPSULE(150 MG) BY MOUTH DAILY WITH BREAKFAST 90 capsule 3  ? zolpidem (AMBIEN) 10 MG tablet Take 10 mg by mouth at bedtime as needed for sleep.    ? ?No current facility-administered medications for this visit.  ? ? ?Allergies  ?Allergen Reactions  ? Caffeine Palpitations  ? Ciprofloxacin   ?  Other reaction(s): itching  ? Codeine Itching  ? Meperidine Hcl   ?  Other reaction(s): hives  ? Ondansetron   ?  Other reaction(s): itching - can take with Benadryl  ? Phenergan [Promethazine]   ?  Other reaction(s): hives  ? Prednisone   ?  Other reaction(s): extreme dizziness  ? Septra [Sulfamethoxazole-Trimethoprim]   ?  Other reaction(s): blurred vision, itching  ? Phenergan [Promethazine Hcl] Itching and Rash  ? ? ? ? ?Review of Systems negative except from HPI and PMH ? ?Physical Exam ?BP 118/82   Pulse 75   Ht 5\' 9"  (1.753 m)   Wt 191 lb (86.6 kg)   SpO2 98%    BMI 28.21 kg/m?  ?Well developed and nourished in no acute distress ?HENT normal ?Neck supple with JVP-  flat   ?Clear ?Regular rate and rhythm, no murmurs or gallops ?Abd-soft with active BS ?No Clubbing cyanosis edema ?Skin-warm and dry ?A & Oriented  Grossly normal sensory and motor function ? ?ECG sinus at 75 ?Intervals 14/09/36 ? ? ?Assessment and  Plan ? ?Syncope ? ?Orthostatic and heat intolerance ? ?Postural Tachycardia ? ?Nonpositional tachycardia ? ?Elevated blood pressure ? ?Miscarriages--multiple ? ?  Anxiety ? ?Beta-blockers are doing a great job as is her therapies for her anxiety and depression.  Hormonal suppression of her menses is also been associate with a vast improvement.  More recently she has had abrupt onset offset tachypalpitations with documentation of her heart rate going from 70--170 in a couple of seconds and then back down.  These are happening frequently.  They sound like AV node reentry. ? ?She will try and use her Apple Watch for documentation; if it does not work, she is on Medicaid, and we can get an event recorder.  Her Medicaid ends in June, so would like to get this ablated if we can prior to that.  She is very interested in ablation for this. ?  ?

## 2021-07-09 ENCOUNTER — Encounter: Payer: Self-pay | Admitting: Internal Medicine

## 2021-07-10 NOTE — Telephone Encounter (Signed)
Pt is scheduled for consult with Dr Ladona Ridgel on 07/17/2021. ?

## 2021-07-17 ENCOUNTER — Encounter: Payer: Self-pay | Admitting: *Deleted

## 2021-07-17 ENCOUNTER — Encounter: Payer: Self-pay | Admitting: Internal Medicine

## 2021-07-17 ENCOUNTER — Ambulatory Visit (INDEPENDENT_AMBULATORY_CARE_PROVIDER_SITE_OTHER): Payer: Medicaid Other | Admitting: Internal Medicine

## 2021-07-17 VITALS — BP 110/70 | HR 84 | Ht 69.0 in | Wt 192.4 lb

## 2021-07-17 DIAGNOSIS — Z01818 Encounter for other preprocedural examination: Secondary | ICD-10-CM | POA: Diagnosis not present

## 2021-07-17 DIAGNOSIS — I472 Ventricular tachycardia, unspecified: Secondary | ICD-10-CM | POA: Diagnosis not present

## 2021-07-17 NOTE — Patient Instructions (Addendum)
Medication Instructions:  Your physician recommends that you continue on your current medications as directed. Please refer to the Current Medication list given to you today. *If you need a refill on your cardiac medications before your next appointment, please call your pharmacy*  Lab Work: June 14  If you have labs (blood work) drawn today and your tests are completely normal, you will receive your results only by: [place] (if you have MyChart) OR A paper copy in the mail If you have any lab test that is abnormal or we need to change your treatment, we will call you to review the results.  Testing/Procedures: Your physician has recommended that you have an ablation. Catheter ablation is a medical procedure used to treat some cardiac arrhythmias (irregular heartbeats). During catheter ablation, a long, thin, flexible tube is put into a blood vessel in your groin (upper thigh), or neck. This tube is called an ablation catheter. It is then guided to your heart through the blood vessel. Radio frequency waves destroy small areas of heart tissue where abnormal heartbeats may cause an arrhythmia to start. Please see the instruction sheet given to you today.   Follow-Up: At [facility], you and your health needs are our priority.  As part of our continuing mission to provide you with exceptional heart care, we have created designated Provider Care Teams.  These Care Teams include your primary Cardiologist (physician) and Advanced Practice Providers (APPs -  Physician Assistants and Nurse Practitioners) who all work together to provide you with the care you need, when you need it.  Your physician wants you to follow-up in: see instruction letter.   We recommend signing up for the patient portal called "MyChart".  Sign up information is provided on this After Visit Summary.  MyChart is used to connect with patients for Virtual Visits (Telemedicine).  Patients are able to view lab/test results,  encounter notes, upcoming appointments, etc.  Non-urgent messages can be sent to your provider as well.   To learn more about what you can do with MyChart, go to [email or web address].    Any Other Special Instructions Will Be Listed Below (If Applicable).  Cardiac Ablation Cardiac ablation is a procedure to destroy (ablate) some heart tissue that is sending bad signals. These bad signals cause problems in heart rhythm. The heart has many areas that make these signals. If there are problems in these areas, they can make the heart beat in a way that is not normal. Destroying some tissues can help make the heart rhythm normal. Tell your doctor about: Any allergies you have. All medicines you are taking. These include vitamins, herbs, eye drops, creams, and over-the-counter medicines. Any problems you or family members have had with medicines that make you fall asleep (anesthetics). Any blood disorders you have. Any surgeries you have had. Any medical conditions you have, such as kidney failure. Whether you are pregnant or may be pregnant. What are the risks? This is a safe procedure. But problems may occur, including: Infection. Bruising and bleeding. Bleeding into the chest. Stroke or blood clots. Damage to nearby areas of your body. Allergies to medicines or dyes. The need for a pacemaker if the normal system is damaged. Failure of the procedure to treat the problem. What happens before the procedure? Medicines Ask your doctor about: Changing or stopping your normal medicines. This is important. Taking aspirin and ibuprofen. Do not take these medicines unless your doctor tells you to take them. Taking other medicines, vitamins, herbs, and  supplements. General instructions Follow instructions from your doctor about what you cannot eat or drink. Plan to have someone take you home from the hospital or clinic. If you will be going home right after the procedure, plan to have someone  with you for 24 hours. Ask your doctor what steps will be taken to prevent infection. What happens during the procedure?  An IV tube will be put into one of your veins. You will be given a medicine to help you relax. The skin on your neck or groin will be numbed. A cut (incision) will be made in your neck or groin. A needle will be put through your cut and into a large vein. A tube (catheter) will be put into the needle. The tube will be moved to your heart. Dye may be put through the tube. This helps your doctor see your heart. Small devices (electrodes) on the tube will send out signals. A type of energy will be used to destroy some heart tissue. The tube will be taken out. Pressure will be held on your cut. This helps stop bleeding. A bandage will be put over your cut. The exact procedure may vary among doctors and hospitals. What happens after the procedure? You will be watched until you leave the hospital or clinic. This includes checking your heart rate, breathing rate, oxygen, and blood pressure. Your cut will be watched for bleeding. You will need to lie still for a few hours. Do not drive for 24 hours or as long as your doctor tells you. Summary Cardiac ablation is a procedure to destroy some heart tissue. This is done to treat heart rhythm problems. Tell your doctor about any medical conditions you may have. Tell him or her about all medicines you are taking to treat them. This is a safe procedure. But problems may occur. These include infection, bruising, bleeding, and damage to nearby areas of your body. Follow what your doctor tells you about food and drink. You may also be told to change or stop some of your medicines. After the procedure, do not drive for 24 hours or as long as your doctor tells you. This information is not intended to replace advice given to you by your health care provider. Make sure you discuss any questions you have with your health care  provider. Document Revised: 01/15/2019 Document Reviewed: 01/15/2019 Elsevier Patient Education  [place].

## 2021-07-17 NOTE — Progress Notes (Signed)
HPI Mia Rubio is referred by Dr. Crissie Sickles for consideration for EP study and possible ablation. She is a pleasant 41 yo woman with known orthostasis who has been followed by Dr. Crissie Sickles in the past. She has developed problems with palpitations which start and stop suddenly and she describes going from 70 to 170/min. She has not had syncope but does get lightheaded. We do not have a documented episode of SVT on an ECG. She has worn monitors remotely. She has a watch which demonstrates HR's up to 170-180/min.  Allergies  Allergen Reactions   Caffeine Palpitations   Ciprofloxacin     Other reaction(s): itching   Codeine Itching   Meperidine Hcl     Other reaction(s): hives   Ondansetron     Other reaction(s): itching - can take with Benadryl   Phenergan [Promethazine]     Other reaction(s): hives   Prednisone     Other reaction(s): extreme dizziness   Septra [Sulfamethoxazole-Trimethoprim]     Other reaction(s): blurred vision, itching   Phenergan [Promethazine Hcl] Itching and Rash     Current Outpatient Medications  Medication Sig Dispense Refill   ALPRAZolam (XANAX XR) 1 MG 24 hr tablet Take 1 mg by mouth every morning. Take 1 mg by mouth every morning.     ALPRAZolam (XANAX) 0.5 MG tablet Take 1 tablet by mouth as needed.     atenolol (TENORMIN) 100 MG tablet Take 1 tablet (100 mg total) by mouth daily. 90 tablet 1   Botulinum Toxin Type A (BOTOX) 200 units SOLR PROVIDER TO INJECT 155 UNITS INTO THE MUSCLES OF THE HEAD AND NECK EVERY 12 WEEKS. DISCARD REMAINDER. 1 each 1   buPROPion (WELLBUTRIN SR) 150 MG 12 hr tablet Take 150 mg by mouth every morning.     dicyclomine (BENTYL) 20 MG tablet Take 20 mg by mouth as needed. Take 20 mg by mouth as needed.     diphenhydrAMINE (BENADRYL) 25 MG tablet Take 2 tablets by mouth daily.     ibuprofen (ADVIL) 800 MG tablet Take 1 tablet (800 mg total) by mouth 3 (three) times daily as needed. Take 800 mg by mouth 3 (three) times daily as needed.  90 tablet 11   ketorolac (TORADOL) 60 MG/2ML SOLN injection Inject 1-26ml (30-60mg ) intramuscularly at onset of migraine. May repeat in 6 hours. Max twice a day and 4 days per month. 10 mL 4   levonorgestrel-ethinyl estradiol (SEASONALE,INTROVALE,JOLESSA) 0.15-0.03 MG tablet Take 1 tablet by mouth daily.     meclizine (ANTIVERT) 25 MG tablet Take 1 tablet (25 mg total) by mouth 3 (three) times daily as needed for dizziness. 30 tablet 11   Multiple Vitamin (MULTIVITAMIN PO) Take by mouth.     ondansetron (ZOFRAN ODT) 8 MG disintegrating tablet Take 1 tablet (8 mg total) by mouth every 8 (eight) hours as needed for nausea or vomiting. 30 tablet 11   rizatriptan (MAXALT-MLT) 10 MG disintegrating tablet Take 1 tablet (10 mg total) by mouth as needed for migraine. May repeat in 2 hours if needed 9 tablet 11   SYRINGE-NEEDLE, DISP, 3 ML (B-D 3CC LUER-LOK SYR 25GX1") 25G X 1" 3 ML MISC Use with Toradol 4 each 5   venlafaxine XR (EFFEXOR-XR) 150 MG 24 hr capsule TAKE 1 CAPSULE(150 MG) BY MOUTH DAILY WITH BREAKFAST 90 capsule 3   zolpidem (AMBIEN) 10 MG tablet Take 10 mg by mouth at bedtime as needed for sleep.     No current facility-administered  medications for this visit.     Past Medical History:  Diagnosis Date   Common migraine with intractable migraine 12/19/2017   Hypotension    Low blood pressure    Panic disorder 12/19/2017   "something that happens when I have a flare" regarding POTS   POTS (postural orthostatic tachycardia syndrome)    Tachycardia     ROS:   All systems reviewed and negative except as noted in the HPI.   Past Surgical History:  Procedure Laterality Date   APPENDECTOMY     BREAST SURGERY     CESAREAN SECTION N/A 08/19/2014   Procedure: CESAREAN SECTION;  Surgeon: Myna Hidalgo, DO;  Location: [facility];  Service: Obstetrics;  Laterality: N/A;   DILATION AND EVACUATION N/A 11/24/2016   Procedure: DILATATION AND EVACUATION;  Surgeon: Kirkland Hun, MD;  Location:  [facility];  Service: Gynecology;  Laterality: N/A;   FOOT SURGERY  2011 2012   KNEE SURGERY  2001  2002   plantar fasciitis       Family History  Problem Relation Age of Onset   Cancer Maternal Grandmother        leg   Diabetes Maternal Grandmother    Heart murmur Father    Headache Maternal Great-grandmother        "sick headache", probably migraine      Social History   Socioeconomic History   Marital status: Married    Spouse name: Not on file   Number of children: Not on file   Years of education: Not on file   Highest education level: Not on file  Occupational History   Not on file  Tobacco Use   Smoking status: Former    Packs/day: 1.00    Years: 14.00    Pack years: 14.00    Types: Cigarettes    Quit date: 2016    Years since quitting: 7.3   Smokeless tobacco: Never   Tobacco comments:    doesn't think she smoked a pack a day for the whole time   Vaping Use   Vaping Use: Never used  Substance and Sexual Activity   Alcohol use: Not Currently   Drug use: No   Sexual activity: Not on file  Other Topics Concern   Not on file  Social History Narrative   Lives at home with husband and son    Right handed   Caffeine: none    Social Determinants of Health   Financial Resource Strain: Not on file  Food Insecurity: Not on file  Transportation Needs: Not on file  Physical Activity: Not on file  Stress: Not on file  Social Connections: Not on file  Intimate Partner Violence: Not on file     BP 110/70   Pulse 84   Ht 5\' 9"  (1.753 m)   Wt 192 lb 6.4 oz (87.3 kg)   SpO2 98%   BMI 28.41 kg/m   Physical Exam:  Well appearing NAD HEENT: Unremarkable Neck:  No JVD, no thyromegally Lymphatics:  No adenopathy Back:  No CVA tenderness Lungs:  Clear HEART:  Regular rate rhythm, no murmurs, no rubs, no clicks Abd:  soft, positive bowel sounds, no organomegally, no rebound, no guarding Ext:  2 plus pulses, no edema, no cyanosis, no clubbing Skin:  No  rashes no nodules Neuro:  CN II through XII intact, motor grossly intact  EKG -reviewed. NSR. No pre-excitation   Assess/Plan:  Possible SVT - she is quite symptomatic. I discussed the treatment options  with the patient and her family. The risks/benefits/goals/expectations of EP study and ablation were reviewed. She will call us if she would  like to proceed with ablation. Orthostasis - she understands that it is possible but not likely that all of her symptoms are due to orthostasis and the EP study could be negative.   Sharlot GowdaGregg Reese Senk,MD

## 2021-07-25 ENCOUNTER — Encounter: Payer: Self-pay | Admitting: Neurology

## 2021-07-31 ENCOUNTER — Ambulatory Visit: Payer: Medicaid Other | Admitting: Neurology

## 2021-08-09 ENCOUNTER — Other Ambulatory Visit: Payer: Medicaid Other

## 2021-08-09 DIAGNOSIS — I472 Ventricular tachycardia, unspecified: Secondary | ICD-10-CM

## 2021-08-09 DIAGNOSIS — Z01818 Encounter for other preprocedural examination: Secondary | ICD-10-CM

## 2021-08-09 LAB — BASIC METABOLIC PANEL
BUN/Creatinine Ratio: 14 (ref 9–23)
BUN: 12 mg/dL (ref 6–20)
CO2: 24 mmol/L (ref 20–29)
Calcium: 9.4 mg/dL (ref 8.7–10.2)
Chloride: 109 mmol/L — ABNORMAL HIGH (ref 96–106)
Creatinine, Ser: 0.84 mg/dL (ref 0.57–1.00)
Glucose: 75 mg/dL (ref 70–99)
Potassium: 3.6 mmol/L (ref 3.5–5.2)
Sodium: 145 mmol/L — ABNORMAL HIGH (ref 134–144)
eGFR: 91 mL/min/{1.73_m2} (ref >59)

## 2021-08-09 LAB — CBC WITH DIFFERENTIAL/PLATELET
Basophils Absolute: 0.1 10*3/uL (ref 0.0–0.2)
Basos: 1 %
EOS (ABSOLUTE): 0.2 10*3/uL (ref 0.0–0.4)
Eos: 3 %
Hematocrit: 36 % (ref 34.0–46.6)
Hemoglobin: 12.6 g/dL (ref 11.1–15.9)
Immature Grans (Abs): 0 10*3/uL (ref 0.0–0.1)
Immature Granulocytes: 0 %
Lymphocytes Absolute: 2.4 10*3/uL (ref 0.7–3.1)
Lymphs: 35 %
MCH: 30.3 pg (ref 26.6–33.0)
MCHC: 35 g/dL (ref 31.5–35.7)
MCV: 87 fL (ref 79–97)
Monocytes Absolute: 0.5 10*3/uL (ref 0.1–0.9)
Monocytes: 8 %
Neutrophils Absolute: 3.6 10*3/uL (ref 1.4–7.0)
Neutrophils: 53 %
Platelets: 224 10*3/uL (ref 150–450)
RBC: 4.16 x10E6/uL (ref 3.77–5.28)
RDW: 12.1 % (ref 11.7–15.4)
WBC: 6.8 10*3/uL (ref 3.4–10.8)

## 2021-08-14 ENCOUNTER — Ambulatory Visit: Payer: Medicaid Other | Admitting: Internal Medicine

## 2021-08-15 ENCOUNTER — Encounter: Payer: Self-pay | Admitting: Neurology

## 2021-08-15 ENCOUNTER — Other Ambulatory Visit (HOSPITAL_COMMUNITY): Payer: Self-pay

## 2021-08-15 ENCOUNTER — Ambulatory Visit: Payer: Medicaid Other | Admitting: Neurology

## 2021-08-15 VITALS — BP 121/89 | HR 87 | Wt 195.8 lb

## 2021-08-15 DIAGNOSIS — H5713 Ocular pain, bilateral: Secondary | ICD-10-CM

## 2021-08-15 DIAGNOSIS — H499 Unspecified paralytic strabismus: Secondary | ICD-10-CM

## 2021-08-15 DIAGNOSIS — R9082 White matter disease, unspecified: Secondary | ICD-10-CM

## 2021-08-15 DIAGNOSIS — G379 Demyelinating disease of central nervous system, unspecified: Secondary | ICD-10-CM

## 2021-08-15 DIAGNOSIS — H532 Diplopia: Secondary | ICD-10-CM | POA: Diagnosis not present

## 2021-08-15 DIAGNOSIS — G43711 Chronic migraine without aura, intractable, with status migrainosus: Secondary | ICD-10-CM

## 2021-08-15 DIAGNOSIS — G43109 Migraine with aura, not intractable, without status migrainosus: Secondary | ICD-10-CM

## 2021-08-15 DIAGNOSIS — H539 Unspecified visual disturbance: Secondary | ICD-10-CM

## 2021-08-15 DIAGNOSIS — G43009 Migraine without aura, not intractable, without status migrainosus: Secondary | ICD-10-CM | POA: Diagnosis not present

## 2021-08-15 DIAGNOSIS — G500 Trigeminal neuralgia: Secondary | ICD-10-CM

## 2021-08-15 DIAGNOSIS — H05113 Granuloma of bilateral orbits: Secondary | ICD-10-CM

## 2021-08-15 MED ORDER — KETOROLAC TROMETHAMINE 60 MG/2ML IM SOLN
INTRAMUSCULAR | 4 refills | Status: DC
  Filled 2021-08-15: qty 8, 3d supply, fill #0
  Filled 2021-09-01: qty 10, 3d supply, fill #0
  Filled 2022-01-09: qty 10, 3d supply, fill #1
  Filled 2022-02-12 – 2022-05-15 (×2): qty 10, 3d supply, fill #2
  Filled 2022-06-12: qty 10, 3d supply, fill #3

## 2021-08-15 MED ORDER — KETOROLAC TROMETHAMINE 60 MG/2ML IM SOLN
60.0000 mg | Freq: Once | INTRAMUSCULAR | Status: AC
  Administered 2021-08-15: 60 mg via INTRAMUSCULAR

## 2021-08-15 MED ORDER — NURTEC 75 MG PO TBDP: 75.0000 mg | ORAL_TABLET | Freq: Every day | ORAL | 11 refills | Status: DC | PRN

## 2021-08-15 MED ORDER — "BD LUER-LOK SYRINGE 25G X 1"" 3 ML MISC"
5 refills | Status: DC
  Filled 2021-08-15: qty 8, 28d supply, fill #0
  Filled 2021-09-01: qty 4, 30d supply, fill #0
  Filled 2022-01-09: qty 4, 30d supply, fill #1
  Filled 2022-02-12: qty 4, 30d supply, fill #2
  Filled 2022-05-15: qty 6, 30d supply, fill #2
  Filled 2022-06-12: qty 6, 30d supply, fill #3

## 2021-08-15 NOTE — Progress Notes (Signed)
Verbal order for toradol 60mg /72ml per Dr. 3m for pt.  Under aseptic technique toradol 27ml / 60mg  given IM to Right upper outer quadrant (buttock).  Tolerated well.  Bandaid applied.

## 2021-08-15 NOTE — Patient Instructions (Addendum)
Start emgality again monthly with the botox for migraine prevention. First month 2 injections. Every month thereafter one injection.  Toradol injections for severe migraines MRI brain and orbits - will call Also try Nurtec for acute/emergency management when you have a migraine: once daily as needed for migraine may be taken with other medications including rizatriptan   Rimegepant Disintegrating Tablets What is this medication? RIMEGEPANT (ri ME je pant) prevents and treats migraines. It works by blocking a substance in the body that causes migraines. This medicine may be used for other purposes; ask your health care provider or pharmacist if you have questions. COMMON BRAND NAME(S): NURTEC ODT What should I tell my care team before I take this medication? They need to know if you have any of these conditions: Kidney disease Liver disease An unusual or allergic reaction to rimegepant, other medications, foods, dyes, or preservatives Pregnant or trying to get pregnant Breast-feeding How should I use this medication? Take this medication by mouth. Take it as directed on the prescription label. Leave the tablet in the sealed pack until you are ready to take it. With dry hands, open the pack and gently remove the tablet. If the tablet breaks or crumbles, throw it away. Use a new tablet. Place the tablet in the mouth and allow it to dissolve. Then, swallow it. Do not cut, crush, or chew this medication. You do not need water to take this medication. Talk to your care team about the use of this medication in children. Special care may be needed. Overdosage: If you think you have taken too much of this medicine contact a poison control center or emergency room at once. NOTE: This medicine is only for you. Do not share this medicine with others. What if I miss a dose? This does not apply. This medication is not for regular use. What may interact with this medication? Certain medications for fungal  infections, such as fluconazole, itraconazole Rifampin This list may not describe all possible interactions. Give your health care provider a list of all the medicines, herbs, non-prescription drugs, or dietary supplements you use. Also tell them if you smoke, drink alcohol, or use illegal drugs. Some items may interact with your medicine. What should I watch for while using this medication? Visit your care team for regular checks on your progress. Tell your care team if your symptoms do not start to get better or if they get worse. What side effects may I notice from receiving this medication? Side effects that you should report to your care team as soon as possible: Allergic reactions--skin rash, itching, hives, swelling of the face, lips, tongue, or throat Side effects that usually do not require medical attention (report to your care team if they continue or are bothersome): Nausea Stomach pain This list may not describe all possible side effects. Call your doctor for medical advice about side effects. You may report side effects to FDA at 1-800-FDA-1088. Where should I keep my medication? Keep out of the reach of children and pets. Store at room temperature between 20 and 25 degrees C (68 and 77 degrees F). Get rid of any unused medication after the expiration date. To get rid of medications that are no longer needed or have expired: Take the medication to a medication take-back program. Check with your pharmacy or law enforcement to find a location. If you cannot return the medication, check the label or package insert to see if the medication should be thrown out in the garbage  or flushed down the toilet. If you are not sure, ask your care team. If it is safe to put it in the trash, take the medication out of the container. Mix the medication with cat litter, dirt, coffee grounds, or other unwanted substance. Seal the mixture in a bag or container. Put it in the trash. NOTE: This sheet is a  summary. It may not cover all possible information. If you have questions about this medicine, talk to your doctor, pharmacist, or health care provider.  2023 Elsevier/Gold Standard (2021-04-05 00:00:00) Galcanezumab Injection What is this medication? GALCANEZUMAB (gal ka NEZ ue mab) prevents migraines. It works by blocking a substance in the body that causes migraines. It may also be used to treat cluster headaches. It is a monoclonal antibody. This medicine may be used for other purposes; ask your health care provider or pharmacist if you have questions. COMMON BRAND NAME(S): Emgality What should I tell my care team before I take this medication? They need to know if you have any of these conditions: An unusual or allergic reaction to galcanezumab, other medications, foods, dyes, or preservatives Pregnant or trying to get pregnant Breast-feeding How should I use this medication? This medication is injected under the skin. You will be taught how to prepare and give it. Take it as directed on the prescription label. Keep taking it unless your care team tells you to stop. It is important that you put your used needles and syringes in a special sharps container. Do not put them in a trash can. If you do not have a sharps container, call your pharmacist or care team to get one. Talk to your care team about the use of this medication in children. Special care may be needed. Overdosage: If you think you have taken too much of this medicine contact a poison control center or emergency room at once. NOTE: This medicine is only for you. Do not share this medicine with others. What if I miss a dose? If you miss a dose, take it as soon as you can. If it is almost time for your next dose, take only that dose. Do not take double or extra doses. What may interact with this medication? Interactions are not expected. This list may not describe all possible interactions. Give your health care provider a list  of all the medicines, herbs, non-prescription drugs, or dietary supplements you use. Also tell them if you smoke, drink alcohol, or use illegal drugs. Some items may interact with your medicine. What should I watch for while using this medication? Visit your care team for regular checks on your progress. Tell your care team if your symptoms do not start to get better or if they get worse. What side effects may I notice from receiving this medication? Side effects that you should report to your care team as soon as possible: Allergic reactions or angioedema--skin rash, itching or hives, swelling of the face, eyes, lips, tongue, arms, or legs, trouble swallowing or breathing Side effects that usually do not require medical attention (report to your care team if they continue or are bothersome): Pain, redness, or irritation at injection site This list may not describe all possible side effects. Call your doctor for medical advice about side effects. You may report side effects to FDA at 1-800-FDA-1088. Where should I keep my medication? Keep out of the reach of children and pets. Store in a refrigerator or at room temperature between 20 and 25 degrees C (68 and 77  degrees F). Refrigeration (preferred): Store in the refrigerator. Do not freeze. Keep in the original container until you are ready to take it. Remove the dose from the carton about 30 minutes before it is time for you to use it. If the dose is not used, it may be stored in original container at room temperature for 7 days. Get rid of any unused medication after the expiration date. Room Temperature: This medication may be stored at room temperature for up to 7 days. Keep it in the original container. Protect from light until time of use. If it is stored at room temperature, get rid of any unused medication after 7 days or after it expires, whichever is first. To get rid of medications that are no longer needed or have expired: Take the  medication to a medication take-back program. Check with your pharmacy or law enforcement to find a location. If you cannot return the medication, ask your pharmacist or care team how to get rid of this medication safely. NOTE: This sheet is a summary. It may not cover all possible information. If you have questions about this medicine, talk to your doctor, pharmacist, or health care provider.  2023 Elsevier/Gold Standard (2021-04-10 00:00:00)

## 2021-08-15 NOTE — Progress Notes (Unsigned)
Nerve block (w/o) steroid: Pt signed consent YES  0.5% Bupivocaine 12 mL LOT: [id number] EXP: 01/26 NDC: [id number]

## 2021-08-15 NOTE — Progress Notes (Signed)
[facility]    Provider:  Dr Jaynee Eagles Requesting Provider:  Jolyn Nap MD Primary Care Provider:  Carolee Rota, NP  CC:  Headaches, generalized light sensitivity, vestibular issues and visual disturbance  08/15/2021; Patient is here for new chief complaint sharp pains behind eyes. PMHx migraines doing well on botox, POTS, anxiety. She is clenching her teeth a lot. This is completely different than the migraines. She says she gets brain zaps, used to get them once a month now it is throughout the day multiple times a day, in her eyeballs in her eyeballs and tongue when she moves her eyes laterally or medially. Not coming from anywhere actually in the eyeball. She saw the eye doctor and everything was fine. She feels like she has a film over her eyes, blurry vision, diplopia, pain on eye movement, brief, sharp, not radiating. 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 We also discussed emgality.   Patient complains of symptoms per HPI as well as the following symptoms: eye pain . Pertinent negatives and positives per HPI. All others negative   MRI brain 04/2020: reviewed images and agree: IMPRESSION: Slightly abnormal MRI scan of the brain with and without contrast showing tiny scattered bilateral supratentorial white matter hyperintensities with a differential discussed above.  07/2021 cbc/bmp unremarkable  HPI:  Mia Rubio is a 41 y.o. female here as requested by Carolee Rota, NP for headaches, visual disturbance, vestibular issues.  She has a past medical history of POTS, anxiety, seeing psychiatry to establish care.  I reviewed Dr. Duane Lope notes, patient was seen there for visual disturbance, patient stated her vision seemed fine with glasses but she noted issues with three-dimensional perception in which some objects in her vision seem to move further closer away, exacerbated with motion such as riding the lawnmower or driving in the car.  She has no true diplopia, she  says she has not been driving in several years because the depth perception problem seems to be an issue, light sensitivity in general and lights can sometimes trigger migraine headaches or scintillating scotoma, the visual aura can occur a number of times a week, she seen a neurologist does not trial of migraine medications, she has POTS disease and is on atenolol but does not think this is related and is connected to her POTS.  Dr. Christia Reading Martin's examination showed visual acuity of 20/20 and 20/25, small anisocoria with the right pupil larger than the left about the same in darkness and light, no relative apparent pupillary defect, full visual fields, full extraocular movements, slit-lamp and fundus exam normal, diagnosis "a relatively normal neuro ophthalmic examination ", minimal evidence of dry eye syndrome, may be a migraine related issue.  Patient saw Dr. Jannifer Franklin in October 2019, female with a history of migraine headache, migraines headaches around 2010, MRI of the brain unremarkable, patient has POTS since 2010, diagnosed with POTS in 2017, on Florinef, longstanding history of motion sickness, rapid head movements and fluorescent lights may worsen her symptoms, lots of anxiety issues and now having panic attacks, denied any other focal neurologic symptoms.  She has light sensitivity, she has not driven since she has light sensitivity. She has tremors in hre hands. She has "lack of oxygen to the brain" because of a POTS flare, she has bad memory and can;t get words out, eyes tremor when she wakes up(dry eye?), when she is driving she may get hit by a ray of light and it caused her vision to start going away from  her, causes anxiety and then a POTS flare, headlights at night trigger it, if she takes xanax she can avoid this and keeps her calm so she can enjoy loud music for example, she has auras twice a day, Migraines do not always have an aura and she tends to have auras without headaches, but can  happen with headache, feels like they start slow, the aura is annoying but doesn't stop her from doing anything.  28 headache days a month, 15 migraine days a month without aura that are moderately severe, can be unilateral, pulsating/pounding/throbbing, movement makes it worse, photophobia, nausea, last up to 24 hours to 72 hours, ongoing for > 1 year at this severity and frequency, no medication overuse. Patient has blurry vision with the headaches, wakes with them and can be worse supine. No other focal neurologic deficits, associated symptoms, inciting events or modifiable factors.  Patient endorses trial periods more than 3 months in the past for these medications.  Medications tried that can be used in migraine management includes: Atenolol(tried moe than 3 months),Amitriptyline(tried more than 3 months), venlafaxine(ried more than 3 months), sumatriptan, topamax(just started), amitriptyline, maxalt, gabapentin(more than 3 months trial)  Reviewed notes, labs and imaging from outside physicians, which showed: see above  05/21/2019: BMP normal  Review of Systems: Patient complains of symptoms per HPI as well as the following symptoms:light sensitivity. Pertinent negatives and positives per HPI. All others negative.   Social History   Socioeconomic History   Marital status: Married    Spouse name: Not on file   Number of children: Not on file   Years of education: Not on file   Highest education level: Not on file  Occupational History   Not on file  Tobacco Use   Smoking status: Former    Packs/day: 1.00    Years: 14.00    Total pack years: 14.00    Types: Cigarettes    Quit date: 2016    Years since quitting: 7.4   Smokeless tobacco: Never   Tobacco comments:    doesn't think she smoked a pack a day for the whole time   Vaping Use   Vaping Use: Never used  Substance and Sexual Activity   Alcohol use: Not Currently   Drug use: No   Sexual activity: Not on file  Other Topics  Concern   Not on file  Social History Narrative   Lives at home with husband and son    Right handed   Caffeine: none    Social Determinants of Health   Financial Resource Strain: Not on file  Food Insecurity: Not on file  Transportation Needs: Not on file  Physical Activity: Not on file  Stress: Not on file  Social Connections: Not on file  Intimate Partner Violence: Not on file    Family History  Problem Relation Age of Onset   Cancer Maternal Grandmother        leg   Diabetes Maternal Grandmother    Heart murmur Father    Headache Maternal Great-grandmother        "sick headache", probably migraine     Past Medical History:  Diagnosis Date   Common migraine with intractable migraine 12/19/2017   Hypotension    Low blood pressure    Panic disorder 12/19/2017   "something that happens when I have a flare" regarding POTS   POTS (postural orthostatic tachycardia syndrome)    Tachycardia     Patient Active Problem List   Diagnosis Date  Noted   Chronic migraine without aura without status migrainosus, not intractable 04/28/2020   Migraine with aura and without status migrainosus, not intractable 04/28/2020   Light sensitivity 04/28/2020   POTS (postural orthostatic tachycardia syndrome) 12/19/2017   Panic disorder 12/19/2017   Common migraine with intractable migraine 12/19/2017   Missed abortion 11/24/2016   Intrauterine normal pregnancy 08/19/2014    Past Surgical History:  Procedure Laterality Date   APPENDECTOMY     BREAST SURGERY     CESAREAN SECTION N/A 08/19/2014   Procedure: CESAREAN SECTION;  Surgeon: Myna Hidalgo, DO;  Location: [facility];  Service: Obstetrics;  Laterality: N/A;   DILATION AND EVACUATION N/A 11/24/2016   Procedure: DILATATION AND EVACUATION;  Surgeon: Kirkland Hun, MD;  Location: [facility];  Service: Gynecology;  Laterality: N/A;   FOOT SURGERY  2011 2012   KNEE SURGERY  2001  2002   plantar fasciitis      Current Outpatient  Medications  Medication Sig Dispense Refill   ALPRAZolam (XANAX XR) 1 MG 24 hr tablet Take 1 mg by mouth every morning. Take 1 mg by mouth every morning.     ALPRAZolam (XANAX) 0.5 MG tablet Take 1 tablet by mouth as needed.     atenolol (TENORMIN) 100 MG tablet Take 1 tablet (100 mg total) by mouth daily. 90 tablet 1   Botulinum Toxin Type A (BOTOX) 200 units SOLR PROVIDER TO INJECT 155 UNITS INTO THE MUSCLES OF THE HEAD AND NECK EVERY 12 WEEKS. DISCARD REMAINDER. 1 each 1   buPROPion (WELLBUTRIN SR) 150 MG 12 hr tablet Take 150 mg by mouth every morning.     dicyclomine (BENTYL) 20 MG tablet Take 20 mg by mouth as needed. Take 20 mg by mouth as needed.     diphenhydrAMINE (BENADRYL) 25 MG tablet Take 2 tablets by mouth daily.     ibuprofen (ADVIL) 800 MG tablet Take 1 tablet (800 mg total) by mouth 3 (three) times daily as needed. Take 800 mg by mouth 3 (three) times daily as needed. 90 tablet 11   ketorolac (TORADOL) 60 MG/2ML SOLN injection Inject 1-34ml (30-60mg ) intramuscularly at onset of migraine. May repeat in 6 hours. Max twice a day and 4 days per month. 10 mL 4   levonorgestrel-ethinyl estradiol (SEASONALE,INTROVALE,JOLESSA) 0.15-0.03 MG tablet Take 1 tablet by mouth daily.     meclizine (ANTIVERT) 25 MG tablet Take 1 tablet (25 mg total) by mouth 3 (three) times daily as needed for dizziness. 30 tablet 11   Multiple Vitamin (MULTIVITAMIN PO) Take by mouth.     ondansetron (ZOFRAN ODT) 8 MG disintegrating tablet Take 1 tablet (8 mg total) by mouth every 8 (eight) hours as needed for nausea or vomiting. 30 tablet 11   rizatriptan (MAXALT-MLT) 10 MG disintegrating tablet Take 1 tablet (10 mg total) by mouth as needed for migraine. May repeat in 2 hours if needed 9 tablet 11   SYRINGE-NEEDLE, DISP, 3 ML (B-D 3CC LUER-LOK SYR 25GX1") 25G X 1" 3 ML MISC Use with Toradol 4 each 5   venlafaxine XR (EFFEXOR-XR) 150 MG 24 hr capsule TAKE 1 CAPSULE(150 MG) BY MOUTH DAILY WITH BREAKFAST 90 capsule 3    zolpidem (AMBIEN) 10 MG tablet Take 10 mg by mouth at bedtime as needed for sleep.     No current facility-administered medications for this visit.    Allergies as of 08/15/2021 - Review Complete 07/17/2021  Allergen Reaction Noted   Caffeine Palpitations 07/14/2015   Ciprofloxacin  11/30/2019  Codeine Itching 11/28/2011   Meperidine hcl  11/30/2019   Ondansetron  11/30/2019   Phenergan [promethazine]  11/30/2019   Prednisone  11/30/2019   Septra [sulfamethoxazole-trimethoprim]  11/30/2019   Phenergan [promethazine hcl] Itching and Rash 05/02/2013    Vitals: There were no vitals taken for this visit. Last Weight:  Wt Readings from Last 1 Encounters:  07/17/21 192 lb 6.4 oz (87.3 kg)   Last Height:   Ht Readings from Last 1 Encounters:  07/17/21 5\' 9"  (1.753 m)   Physical exam: Exam: Gen: NAD, conversant, well nourised, obese, well groomed                     CV: RRR, no MRG. No Carotid Bruits. No peripheral edema, warm, nontender Eyes: Conjunctivae clear without exudates or hemorrhage  Neuro: Detailed Neurologic Exam  Speech:    Speech is normal; fluent and spontaneous with normal comprehension.  Cognition:    The patient is oriented to person, place, and time;     recent and remote memory intact;     language fluent;     normal attention, concentration,     fund of knowledge Cranial Nerves:    The pupils are equal, round, and reactive to light. The fundi are normal and spontaneous venous pulsations are present. Visual fields are full to finger confrontation. Extraocular movements are intact. Trigeminal sensation is intact and the muscles of mastication are normal. The face is symmetric. The palate elevates in the midline. Hearing intact. Voice is normal. Shoulder shrug is normal. The tongue has normal motion without fasciculations.   Coordination:    Normal finger to nose and heel to shin. Normal rapid alternating movements.   Gait:    Heel-toe and tandem gait  are normal.   Motor Observation:    No asymmetry, no atrophy, and no involuntary movements noted. Tone:    Normal muscle tone.    Posture:    Posture is normal. normal erect    Strength:    Strength is V/V in the upper and lower limbs.      Sensation: intact to LT     Reflex Exam:  DTR's:    Deep tendon reflexes in the upper and lower extremities are normal bilaterally.   Toes:    The toes are downgoing bilaterally.   Clonus:    Clonus is absent.    Assessment/Plan:  41 year old with chronic migraine, failed multiple classes of medications. Doing well on botox now with only 4 migraine days a month and < 10 total headache days a month. Here for new symptoms of electrical shocks in her eyes, blurry vision/diplopia, pain on eye movement, could be atypical trigeminal neuralgia or possibly something like orbital pseudotumor very unusual need MRi brain and orbits.  - MRI of the brain w/wo contrast amd MRi orbits: To follow abnormal white matter changes (MS protocol) and new symptoms of eye pain on movement, blurry vision/diplopia, eye pain as above eval for orbital pseudotumor or cavernous sinus mass unusual may be atypical TGN (electric shocks in the eyes) - She borrowed my somilight glasses and allay lamp, she tried them out for her light sensitivity - will send toradol injections to [facility] to use sparingly - will try nurtec acutely (failed sumatriptan, rizatriptan, zomig) - discussed: FL-41 tinting (somnilight or theraspecs), Car tinting (et online for additonal tinting), Allay light, Emgality in the future if needed* - Topamax at bedtime for migraine prevention   No orders of the defined  types were placed in this encounter.  No orders of the defined types were placed in this encounter.   Cc: Jolyn Nap MD  Carolee Rota, NP  Sarina Ill, MD  [facility] [address] [place], [place] [address]  Phone [phone] Fax [phone]

## 2021-08-17 ENCOUNTER — Telehealth: Payer: Self-pay | Admitting: Neurology

## 2021-08-17 NOTE — Telephone Encounter (Signed)
[facility] community auth: [id number] & [id number] (exp. 08/17/21 to 10/01/21) order sent to GI, they will reach out to the patient to schedule.

## 2021-08-21 ENCOUNTER — Encounter: Payer: Self-pay | Admitting: Internal Medicine

## 2021-08-22 ENCOUNTER — Encounter (HOSPITAL_COMMUNITY)
Admission: RE | Disposition: A | Discharge status: Home / Self Care | Payer: Self-pay | Source: Home / Self Care | Attending: Internal Medicine

## 2021-08-22 ENCOUNTER — Ambulatory Visit (HOSPITAL_COMMUNITY)
Admission: RE | Admit: 2021-08-22 | Discharge: 2021-08-22 | Disposition: A | Discharge status: Home / Self Care | Payer: Medicaid Other | Attending: Internal Medicine | Admitting: Internal Medicine

## 2021-08-22 ENCOUNTER — Other Ambulatory Visit: Payer: Self-pay

## 2021-08-22 DIAGNOSIS — Z8679 Personal history of other diseases of the circulatory system: Secondary | ICD-10-CM

## 2021-08-22 DIAGNOSIS — I951 Orthostatic hypotension: Secondary | ICD-10-CM | POA: Diagnosis not present

## 2021-08-22 DIAGNOSIS — I471 Supraventricular tachycardia: Secondary | ICD-10-CM | POA: Diagnosis present

## 2021-08-22 HISTORY — DX: Personal history of other diseases of the circulatory system: Z86.79

## 2021-08-22 HISTORY — PX: SVT ABLATION: EP1225

## 2021-08-22 LAB — PREGNANCY, URINE: Preg Test, Ur: NEGATIVE

## 2021-08-22 SURGERY — SVT ABLATION

## 2021-08-22 MED ORDER — SODIUM CHLORIDE 0.9 % IV SOLN: INTRAVENOUS | Status: DC

## 2021-08-22 MED ORDER — ACETAMINOPHEN 325 MG PO TABS: 650.0000 mg | ORAL_TABLET | ORAL | Status: DC | PRN

## 2021-08-22 MED ORDER — MIDAZOLAM HCL 5 MG/5ML IJ SOLN
INTRAMUSCULAR | Status: DC | PRN
  Administered 2021-08-22: 2 mg via INTRAVENOUS
  Administered 2021-08-22: 1 mg via INTRAVENOUS
  Administered 2021-08-22: 2 mg via INTRAVENOUS
  Administered 2021-08-22: 1 mg via INTRAVENOUS
  Administered 2021-08-22: 2 mg via INTRAVENOUS
  Administered 2021-08-22 (×2): 1 mg via INTRAVENOUS
  Administered 2021-08-22 (×2): 2 mg via INTRAVENOUS
  Administered 2021-08-22 (×3): 1 mg via INTRAVENOUS
  Administered 2021-08-22 (×2): 2 mg via INTRAVENOUS
  Administered 2021-08-22: 1 mg via INTRAVENOUS
  Administered 2021-08-22: 2 mg via INTRAVENOUS

## 2021-08-22 MED ORDER — SODIUM CHLORIDE 0.9% FLUSH: 3.0000 mL | INTRAVENOUS | Status: DC | PRN

## 2021-08-22 MED ORDER — FENTANYL CITRATE (PF) 100 MCG/2ML IJ SOLN
INTRAMUSCULAR | Status: AC
  Filled 2021-08-22: qty 2

## 2021-08-22 MED ORDER — BUPIVACAINE HCL (PF) 0.25 % IJ SOLN
INTRAMUSCULAR | Status: AC
  Filled 2021-08-22: qty 60

## 2021-08-22 MED ORDER — HEPARIN (PORCINE) IN NACL 1000-0.9 UT/500ML-% IV SOLN
INTRAVENOUS | Status: AC
  Filled 2021-08-22: qty 500

## 2021-08-22 MED ORDER — MIDAZOLAM HCL 5 MG/5ML IJ SOLN
INTRAMUSCULAR | Status: AC
  Filled 2021-08-22: qty 5

## 2021-08-22 MED ORDER — HEPARIN (PORCINE) IN NACL 1000-0.9 UT/500ML-% IV SOLN
INTRAVENOUS | Status: DC | PRN
  Administered 2021-08-22: 500 mL

## 2021-08-22 MED ORDER — FENTANYL CITRATE (PF) 100 MCG/2ML IJ SOLN
INTRAMUSCULAR | Status: DC | PRN
  Administered 2021-08-22: 25 ug via INTRAVENOUS
  Administered 2021-08-22: 12.5 ug via INTRAVENOUS
  Administered 2021-08-22: 25 ug via INTRAVENOUS
  Administered 2021-08-22: 12.5 ug via INTRAVENOUS
  Administered 2021-08-22: 25 ug via INTRAVENOUS
  Administered 2021-08-22 (×2): 12.5 ug via INTRAVENOUS
  Administered 2021-08-22: 25 ug via INTRAVENOUS
  Administered 2021-08-22: 12.5 ug via INTRAVENOUS
  Administered 2021-08-22 (×3): 25 ug via INTRAVENOUS
  Administered 2021-08-22: 12.5 ug via INTRAVENOUS
  Administered 2021-08-22 (×2): 25 ug via INTRAVENOUS

## 2021-08-22 MED ORDER — FENTANYL CITRATE (PF) 100 MCG/2ML IJ SOLN
INTRAMUSCULAR | Status: AC
Start: 2021-08-22 — End: ?
  Filled 2021-08-22: qty 2

## 2021-08-22 MED ORDER — DIPHENHYDRAMINE HCL 50 MG/ML IJ SOLN
INTRAMUSCULAR | Status: DC | PRN
  Administered 2021-08-22: 25 mg via INTRAVENOUS

## 2021-08-22 MED ORDER — BUPIVACAINE HCL (PF) 0.25 % IJ SOLN
INTRAMUSCULAR | Status: DC | PRN
  Administered 2021-08-22: 60 mL

## 2021-08-22 MED ORDER — DIPHENHYDRAMINE HCL 50 MG/ML IJ SOLN
INTRAMUSCULAR | Status: AC
  Filled 2021-08-22: qty 1

## 2021-08-22 MED ORDER — SODIUM CHLORIDE 0.9 % IV SOLN: 250.0000 mL | INTRAVENOUS | Status: DC | PRN

## 2021-08-22 MED ORDER — SODIUM CHLORIDE 0.9 % IV BOLUS
INTRAVENOUS | Status: DC | PRN
  Administered 2021-08-22: 125 mL via INTRAVENOUS

## 2021-08-22 SURGICAL SUPPLY — 11 items
CATH EZ STEER NAV 4MM D-F CUR (ABLATOR) ×2
CATH HEX JOS 2-5-2 65CM 6F REP (CATHETERS) ×2
CATH JOSEPH QUAD ALLRED 6F REP (CATHETERS) ×4
KIT MICROPUNCTURE NIT STIFF (SHEATH) ×2
PACK EP LATEX FREE (CUSTOM PROCEDURE TRAY) ×1
PACK EP LF (CUSTOM PROCEDURE TRAY) ×1
PAD DEFIB RADIO PHYSIO CONN (PAD) ×2
PATCH CARTO3 (PAD) ×2
SHEATH PINNACLE 6F 10CM (SHEATH) ×4
SHEATH PINNACLE 7F 10CM (SHEATH) ×2
SHEATH PINNACLE 8F 10CM (SHEATH) ×2

## 2021-08-23 ENCOUNTER — Other Ambulatory Visit (HOSPITAL_COMMUNITY): Payer: Self-pay

## 2021-08-23 ENCOUNTER — Encounter (HOSPITAL_COMMUNITY): Payer: Self-pay | Admitting: Internal Medicine

## 2021-08-24 ENCOUNTER — Other Ambulatory Visit: Payer: Self-pay

## 2021-08-24 ENCOUNTER — Encounter: Payer: Self-pay | Admitting: Internal Medicine

## 2021-08-24 MED ORDER — ATENOLOL 100 MG PO TABS: 100.0000 mg | ORAL_TABLET | Freq: Every day | ORAL | 3 refills | Status: DC

## 2021-08-31 ENCOUNTER — Other Ambulatory Visit: Payer: Self-pay

## 2021-08-31 ENCOUNTER — Emergency Department (HOSPITAL_BASED_OUTPATIENT_CLINIC_OR_DEPARTMENT_OTHER): Payer: Medicaid Other | Admitting: Radiology

## 2021-08-31 ENCOUNTER — Encounter (HOSPITAL_BASED_OUTPATIENT_CLINIC_OR_DEPARTMENT_OTHER): Payer: Self-pay | Admitting: Emergency Medicine

## 2021-08-31 ENCOUNTER — Ambulatory Visit: Payer: Medicaid Other | Admitting: Neurology

## 2021-08-31 ENCOUNTER — Encounter: Payer: Self-pay | Admitting: Internal Medicine

## 2021-08-31 ENCOUNTER — Emergency Department (HOSPITAL_BASED_OUTPATIENT_CLINIC_OR_DEPARTMENT_OTHER)
Admission: EM | Admit: 2021-08-31 | Discharge: 2021-08-31 | Disposition: A | Discharge status: Home / Self Care | Payer: Medicaid Other | Attending: Emergency Medicine | Admitting: Emergency Medicine

## 2021-08-31 ENCOUNTER — Telehealth: Payer: Self-pay | Admitting: Internal Medicine

## 2021-08-31 DIAGNOSIS — R0602 Shortness of breath: Secondary | ICD-10-CM | POA: Diagnosis not present

## 2021-08-31 DIAGNOSIS — R0789 Other chest pain: Secondary | ICD-10-CM | POA: Diagnosis present

## 2021-08-31 LAB — COMPREHENSIVE METABOLIC PANEL
ALT: 12 U/L (ref 0–44)
AST: 20 U/L (ref 15–41)
Albumin: 5 g/dL (ref 3.5–5.0)
Alkaline Phosphatase: 60 U/L (ref 38–126)
Anion gap: 13 (ref 5–15)
BUN: 11 mg/dL (ref 6–20)
CO2: 20 mmol/L — ABNORMAL LOW (ref 22–32)
Calcium: 10.6 mg/dL — ABNORMAL HIGH (ref 8.9–10.3)
Chloride: 103 mmol/L (ref 98–111)
Creatinine, Ser: 0.94 mg/dL (ref 0.44–1.00)
GFR, Estimated: >60 mL/min (ref >60)
Glucose, Bld: 86 mg/dL (ref 70–99)
Potassium: 4 mmol/L (ref 3.5–5.1)
Sodium: 136 mmol/L (ref 135–145)
Total Bilirubin: 0.7 mg/dL (ref 0.3–1.2)
Total Protein: 8.4 g/dL — ABNORMAL HIGH (ref 6.5–8.1)

## 2021-08-31 LAB — CBC
HCT: 44.8 % (ref 36.0–46.0)
Hemoglobin: 14.9 g/dL (ref 12.0–15.0)
MCH: 29.9 pg (ref 26.0–34.0)
MCHC: 33.3 g/dL (ref 30.0–36.0)
MCV: 89.8 fL (ref 80.0–100.0)
Platelets: 287 10*3/uL (ref 150–400)
RBC: 4.99 MIL/uL (ref 3.87–5.11)
RDW: 12.8 % (ref 11.5–15.5)
WBC: 10.3 10*3/uL (ref 4.0–10.5)
nRBC: 0 % (ref 0.0–0.2)

## 2021-08-31 LAB — D-DIMER, QUANTITATIVE: D-Dimer, Quant: 0.5 ug/mL-FEU (ref 0.00–0.50)

## 2021-08-31 LAB — PREGNANCY, URINE: Preg Test, Ur: NEGATIVE

## 2021-08-31 LAB — TROPONIN I (HIGH SENSITIVITY): Troponin I (High Sensitivity): 2 ng/L (ref <18)

## 2021-08-31 NOTE — ED Triage Notes (Signed)
Pt arrives to ED with c/o chest pain and shortness. Pt reports chest pain on exertion x3 days accompanied by SOB. Recently had SVT ablation on 6/27. Pt reports SOB on exertion.

## 2021-08-31 NOTE — ED Notes (Signed)
RN provided AVS using Teachback Method. Patient verbalizes understanding of Discharge Instructions. Opportunity for Questioning and Answers were provided by RN. Patient Discharged from ED ambulatory to Home via Family. 

## 2021-08-31 NOTE — Discharge Instructions (Addendum)
Your heart labs as well as the labs that we discussed that indicates the likelihood of blood clots are both normal today.  You will be able to see all of these results in MyChart.  Read the information about chest wall pain attached to these discharge papers.  Tylenol, ibuprofen and heat packs are good options.   Follow-up with Dr. Ladona Ridgel.  It was a pleasure to meet you and please do not hesitate to return with any worsening symptoms.

## 2021-08-31 NOTE — Telephone Encounter (Signed)
Pt c/o Shortness Of Breath: STAT if SOB developed within the last 24 hours or pt is noticeably SOB on the phone  1. Are you currently SOB (can you hear that pt is SOB on the phone)? Couples minutes ago  2. How long have you been experiencing SOB? 2 days, yesterday and today  3. Are you SOB when sitting or when up moving around? Moving around   4. Are you currently experiencing any other symptoms? Pt called stating she has this weird feeling in her chest that feels like its bruised on the right side like someone is punching it. She states that if she coughs  she feels it in her chest. She wants to know if this is normal after her SVT ablation. Call transferred to triage.

## 2021-08-31 NOTE — ED Provider Notes (Signed)
[facility] EMERGENCY DEPT Provider Note   CSN: [id number] Arrival date & time: 08/31/21  1553     History  Chief Complaint  Patient presents with   Chest Pain    Mia Rubio is a 41 y.o. female with a past medical history of POTS and SVT status post ablation 6/27 presenting today with complaint of chest wall pain.  Says that since Monday she has been experiencing some pain directly over her right sternum.  She says it is worse with coughing, laughing or ambulating.  Also endorsing some shortness of breath and feels like she is unable to walk more than 1-2 steps without getting winded.  No history of DVT/PE.  Does not smoke tobacco but does vape.  Is on oral contraception.  No leg swelling, cough or hemoptysis.  No history of ACS.  Chest Pain      Home Medications Prior to Admission medications   Medication Sig Start Date End Date Taking? Authorizing Provider  ALPRAZolam (XANAX XR) 1 MG 24 hr tablet Take 1 mg by mouth every morning. 08/15/20   [provider]  ALPRAZolam Prudy Feeler) 0.5 MG tablet Take 0.5 mg by mouth daily at 6 PM. 07/10/21   [provider]  atenolol (TENORMIN) 100 MG tablet Take 1 tablet (100 mg total) by mouth daily. 08/24/21   Duke Salvia, MD  Botulinum Toxin Type A (BOTOX) 200 units SOLR PROVIDER TO INJECT 155 UNITS INTO THE MUSCLES OF THE HEAD AND NECK EVERY 12 WEEKS. DISCARD REMAINDER. 05/15/21   [name], MD  buPROPion [facility] SR) 150 MG 12 hr tablet Take 150 mg by mouth every morning. 11/16/20   [provider]  dicyclomine (BENTYL) 20 MG tablet Take 20 mg by mouth daily as needed for spasms.    [provider]  diphenhydrAMINE (BENADRYL) 25 MG tablet Take 50 mg by mouth at bedtime.    [provider]  ibuprofen (ADVIL) 800 MG tablet Take 1 tablet (800 mg total) by mouth 3 (three) times daily as needed. Take 800 mg by mouth 3 (three) times daily as needed. Patient taking differently: Take  800 mg by mouth 3 (three) times daily as needed (Migraines). 12/06/20   [name], MD  ketorolac (TORADOL) 60 MG/2ML SOLN injection Inject 1-97ml (30-60mg ) intramuscularly at onset of migraine. May repeat in 6 hours. Max twice a day and 4 days per month. 08/15/21   [name], MD  meclizine (ANTIVERT) 25 MG tablet Take 1 tablet (25 mg total) by mouth 3 (three) times daily as needed for dizziness. 12/06/20   [name], MD  Multiple Vitamin (MULTIVITAMIN PO) Take 2 tablets by mouth at bedtime.    [provider]  ondansetron (ZOFRAN ODT) 8 MG disintegrating tablet Take 1 tablet (8 mg total) by mouth every 8 (eight) hours as needed for nausea or vomiting. 12/06/20   [name], MD  Rimegepant Sulfate (NURTEC) 75 MG TBDP Take 75 mg by mouth daily as needed. Patient taking differently: Take 75 mg by mouth daily as needed (Migraines). 08/15/21   [name], MD  rizatriptan (MAXALT-MLT) 10 MG disintegrating tablet Take 1 tablet (10 mg total) by mouth as needed for migraine. May repeat in 2 hours if needed 12/06/20   [name], MD  SLYND 4 MG TABS Take 4 mg by mouth daily. 08/15/21   [provider]  SYRINGE-NEEDLE, DISP, 3 ML (B-D 3CC LUER-LOK SYR 25GX1") 25G X 1" 3 ML  MISC Use with Toradol 08/15/21   [name], MD  venlafaxine XR (EFFEXOR-XR) 150 MG 24 hr capsule TAKE 1 CAPSULE(150 MG) BY MOUTH DAILY WITH BREAKFAST 12/06/20   [name], MD  zolpidem (AMBIEN) 10 MG tablet Take 10 mg by mouth at bedtime.    [provider]      Allergies    Caffeine, Ciprofloxacin, Codeine, Meperidine hcl, Ondansetron, Phenergan [promethazine], Prednisone, Septra [sulfamethoxazole-trimethoprim], and Phenergan [promethazine hcl]    Review of Systems   Review of Systems  Cardiovascular:  Positive for chest pain.    Physical Exam Updated Vital Signs BP 113/89   Pulse 96   Temp 98.2 F (36.8 C)   Resp (!) 24   Ht 5\' 9"  (1.753 m)   Wt 86.2 kg    SpO2 100%   BMI 28.06 kg/m  Physical Exam Vitals and nursing note reviewed.  Constitutional:      Appearance: Normal appearance.  HENT:     Head: Normocephalic and atraumatic.  Eyes:     General: No scleral icterus.    Conjunctiva/sclera: Conjunctivae normal.  Cardiovascular:     Rate and Rhythm: Normal rate and regular rhythm.  Pulmonary:     Effort: Pulmonary effort is normal. No respiratory distress.     Breath sounds: Normal breath sounds.  Chest:     Chest wall: Tenderness present.     Comments: Reproducible pain over anterior right T4-5 ribs Skin:    Findings: No rash.  Neurological:     Mental Status: She is alert.  Psychiatric:        Mood and Affect: Mood normal.     ED Results / Procedures / Treatments   Labs (all labs ordered are listed, but only abnormal results are displayed) Labs Reviewed  COMPREHENSIVE METABOLIC PANEL - Abnormal; Notable for the following components:      Result Value   CO2 20 (*)    Calcium 10.6 (*)    Total Protein 8.4 (*)    All other components within normal limits  CBC  PREGNANCY, URINE  D-DIMER, QUANTITATIVE  TROPONIN I (HIGH SENSITIVITY)    EKG EKG Interpretation  Date/Time:  Thursday August 31 2021 15:59:25 EDT Ventricular Rate:  106 PR Interval:  136 QRS Duration: 84 QT Interval:  342 QTC Calculation: 454 R Axis:   88 Text Interpretation: Sinus tachycardia Otherwise normal ECG When compared with ECG of 22-Aug-2021 17:55, No significant change was found Confirmed by 24-Aug-2021 [phone]) on 08/31/2021 4:25:25 PM  Radiology DG Chest 2 View  Result Date: 08/31/2021 CLINICAL DATA:  Chest pain. EXAM: CHEST - 2 VIEW COMPARISON:  Chest x-ray Jul 21, 2013. FINDINGS: The heart size and mediastinal contours are within normal limits. Both lungs are clear. No visible pleural effusions or pneumothorax. No acute osseous abnormality. IMPRESSION: No active cardiopulmonary disease. Electronically Signed   By: Jul 23, 2013 M.D.    On: 08/31/2021 16:34    Procedures Procedures   Medications Ordered in ED Medications - No data to display  ED Course/ Medical Decision Making/ A&P                           Medical Decision Making Amount and/or Complexity of Data Reviewed Labs: ordered. Radiology: ordered.   This patient presents to the ED for concern of chest pain after SVT ablation last week.  Differential includes but is not limited to ACS, PE, arrhythmia, GERD, dissection, pneumonia, pneumothorax.  This is not an exhaustive differential.    Past Medical History / Co-morbidities / Social History: POTS and SVT   Additional history: Patient's ablation was with Dr. Ladona Ridgel on 6/27.  Appeared to go as planned.   Physical Exam: Hematoma to the left forearm and small hematoma to the anterior right neck.  Reproducible pain in chest wall  Lab Tests: I ordered, and personally interpreted labs.  The pertinent results include:  -Negative troponin  -D-dimer negative   Imaging Studies: I ordered and independently visualized and interpreted chest x-ray which showed no abnormalities. I agree with the radiologist interpretation.   Cardiac Monitoring:  The patient was maintained on a cardiac monitor.  This showed an underlying rhythm of: Sinus, occasionally tachycardic but no SVT   Medications: Declined pain medication  Disposition: After consideration of the diagnostic results and the patients response to treatment, I feel that patient is stable for discharge home.  Low heart score and negative D-dimer.  Low suspicion for PE missed with D-dimer.  Also did not see any abnormalities on her chest x-ray such as pneumonia, pneumothorax, effusion or hemothorax.  EKG without signs of acute ischemia.  She is agreeable to discharge home and will contact her EP tomorrow   I discussed this case with my attending physician Dr. Renaye Rakers who cosigned this note including patient's presenting symptoms, physical exam, and planned  diagnostics and interventions. Attending physician stated agreement with plan or made changes to plan which were implemented.      Final Clinical Impression(s) / ED Diagnoses Final diagnoses:  Chest wall pain    Rx / DC Orders  Results and diagnoses were explained to the patient. Return precautions discussed in full. Patient had no additional questions and expressed complete understanding.   This chart was dictated using voice recognition software.  Despite best efforts to proofread,  errors can occur which can change the documentation meaning.    Woodroe Chen 08/31/21 1733    Terald Sleeper, MD 08/31/21 [phone]

## 2021-08-31 NOTE — Telephone Encounter (Signed)
Patient called [facility] regarding SOB, and constant chest pain that began yesterday 08/30/21.  Chest pain continued today.   Pt states the pain worsens when she ambulates, and is still present after taking her morning meds.  Pt had SVT Ablation on 08/22/21 with Dr. Lewayne Bunting.    Pt has PMH of Pots Syndrome. I had patient sit and do a self BP and HR check.  1st 117/76, P 87, 2nd 109/72 with a pulse of 80.    Per her BP and HR, Pt told they are within normal limits, however Pt still c/o dull constant chest pain continued to be present, with mild SOB when walking.   Due to the patients ongoing CP and SOB, desire to see a provider.... pt advised to have someone drive her to local ER for provider's evaluation / assessment of her symptoms.

## 2021-09-01 ENCOUNTER — Other Ambulatory Visit (HOSPITAL_COMMUNITY): Payer: Self-pay

## 2021-09-07 ENCOUNTER — Encounter: Payer: Self-pay | Admitting: Adult Health

## 2021-09-07 ENCOUNTER — Ambulatory Visit (INDEPENDENT_AMBULATORY_CARE_PROVIDER_SITE_OTHER): Payer: Medicaid Other | Admitting: Adult Health

## 2021-09-07 DIAGNOSIS — G43711 Chronic migraine without aura, intractable, with status migrainosus: Secondary | ICD-10-CM | POA: Diagnosis not present

## 2021-09-07 MED ORDER — ONABOTULINUMTOXINA 100 UNITS IJ SOLR
155.0000 [IU] | Freq: Once | INTRAMUSCULAR | Status: AC
  Administered 2021-09-07: 155 [IU] via INTRAMUSCULAR

## 2021-09-07 NOTE — Progress Notes (Signed)
09/07/2021: Botox is working well.  06/14/2021: Reports botox working well. Two weeks late for botox so she has had more migraines. 1/ week migraine when taking botox consistently. Never severe.    BOTOX PROCEDURE NOTE FOR MIGRAINE HEADACHE    Contraindications and precautions discussed with patient(above). Aseptic procedure was observed and patient tolerated procedure. Procedure performed by Butch Penny, NP  The condition has existed for more than 6 months, and pt does not have a diagnosis of ALS, Myasthenia Gravis or Lambert-Eaton Syndrome.  Risks and benefits of injections discussed and pt agrees to proceed with the procedure.  Written consent obtained  These injections are medically necessary. These injections do not cause sedations or hallucinations which the oral therapies may cause.  Indication/Diagnosis: chronic migraine BOTOX(J0585) injection was performed according to protocol by [employer]. 200 units of BOTOX was dissolved into 4 cc NS.   NDC: [address]-01  Type of toxin: Botox  Botox- 200 units x 1 vial Lot: [id number] Expiration: 05/2023 NDC: 4403-4742-59   0.9% Sodium Chloride- 63mL total Lot: [id number] Expiration: 09/27/2022 NDC: 6433-2951-88   Dx: C16.606  Description of procedure:  The patient was placed in a sitting position. The standard protocol was used for Botox as follows, with 5 units of Botox injected at each site:   -Procerus muscle, midline injection  -Corrugator muscle, bilateral injection  -Frontalis muscle, bilateral injection, with 2 sites each side, medial injection was performed in the upper one third of the frontalis muscle, in the region vertical from the medial inferior edge of the superior orbital rim. The lateral injection was again in the upper one third of the forehead vertically above the lateral limbus of the cornea, 1.5 cm lateral to the medial injection site.  -Temporalis muscle injection, 4 sites, bilaterally. The first  injection was 3 cm above the tragus of the ear, second injection site was 1.5 cm to 3 cm up from the first injection site in line with the tragus of the ear. The third injection site was 1.5-3 cm forward between the first 2 injection sites. The fourth injection site was 1.5 cm posterior to the second injection site.  -Occipitalis muscle injection, 3 sites, bilaterally. The first injection was done one half way between the occipital protuberance and the tip of the mastoid process behind the ear. The second injection site was done lateral and superior to the first, 1 fingerbreadth from the first injection. The third injection site was 1 fingerbreadth superiorly and medially from the first injection site.  -Cervical paraspinal muscle injection, 2 sites, bilateral knee first injection site was 1 cm from the midline of the cervical spine, 3 cm inferior to the lower border of the occipital protuberance. The second injection site was 1.5 cm superiorly and laterally to the first injection site.  -Trapezius muscle injection was performed at 3 sites, bilaterally. The first injection site was in the upper trapezius muscle halfway between the inflection point of the neck, and the acromion. The second injection site was one half way between the acromion and the first injection site. The third injection was done between the first injection site and the inflection point of the neck.   Will return for repeat injection in 3 months.   A 200 units of Botox was used, 155 units were injected, the rest of the Botox was wasted. The patient tolerated the procedure well, there were no complications of the above procedure.  Butch Penny, MSN, NP-C 09/07/2021, 11:48 AM [facility] [address],  [place], [place] [address] [phone]

## 2021-09-07 NOTE — Addendum Note (Signed)
Addended by: Enedina Finner on: 09/07/2021 02:38 PM   Modules accepted: Orders

## 2021-09-07 NOTE — Progress Notes (Signed)
Botox- 200 units x 1 vial Lot: [id number] Expiration: 05/2023 NDC: 0539-7673-41   0.9% Sodium Chloride- 16mL total Lot: [id number] Expiration: 09/27/2022 NDC: 4097-3532-99   Dx: M42.683 S/P   Consent signed.

## 2021-09-08 ENCOUNTER — Ambulatory Visit
Admission: RE | Admit: 2021-09-08 | Discharge: 2021-09-08 | Disposition: A | Discharge status: Home / Self Care | Payer: Medicaid Other | Source: Ambulatory Visit | Attending: Neurology | Admitting: Neurology

## 2021-09-08 DIAGNOSIS — H499 Unspecified paralytic strabismus: Secondary | ICD-10-CM

## 2021-09-08 DIAGNOSIS — H05113 Granuloma of bilateral orbits: Secondary | ICD-10-CM

## 2021-09-08 DIAGNOSIS — G500 Trigeminal neuralgia: Secondary | ICD-10-CM

## 2021-09-08 DIAGNOSIS — R9082 White matter disease, unspecified: Secondary | ICD-10-CM

## 2021-09-08 DIAGNOSIS — H532 Diplopia: Secondary | ICD-10-CM | POA: Diagnosis not present

## 2021-09-08 DIAGNOSIS — H539 Unspecified visual disturbance: Secondary | ICD-10-CM

## 2021-09-08 DIAGNOSIS — H5713 Ocular pain, bilateral: Secondary | ICD-10-CM

## 2021-09-08 DIAGNOSIS — G379 Demyelinating disease of central nervous system, unspecified: Secondary | ICD-10-CM

## 2021-09-08 MED ORDER — GADOBENATE DIMEGLUMINE 529 MG/ML IV SOLN
18.0000 mL | Freq: Once | INTRAVENOUS | Status: AC | PRN
  Administered 2021-09-08: 18 mL via INTRAVENOUS

## 2021-09-26 ENCOUNTER — Ambulatory Visit: Payer: Medicaid Other | Admitting: Internal Medicine

## 2021-10-03 ENCOUNTER — Ambulatory Visit (INDEPENDENT_AMBULATORY_CARE_PROVIDER_SITE_OTHER): Payer: Medicaid Other | Admitting: Internal Medicine

## 2021-10-03 ENCOUNTER — Encounter: Payer: Self-pay | Admitting: Internal Medicine

## 2021-10-03 VITALS — BP 130/70 | HR 82 | Ht 69.0 in | Wt 193.2 lb

## 2021-10-03 DIAGNOSIS — I471 Supraventricular tachycardia: Secondary | ICD-10-CM | POA: Diagnosis not present

## 2021-10-03 DIAGNOSIS — I951 Orthostatic hypotension: Secondary | ICD-10-CM

## 2021-10-03 NOTE — Patient Instructions (Addendum)
Medication Instructions:  Your physician recommends that you continue on your current medications as directed. Please refer to the Current Medication list given to you today.  *If you need a refill on your cardiac medications before your next appointment, please call your pharmacy*  Lab Work: None ordered.  If you have labs (blood work) drawn today and your tests are completely normal, you will receive your results only by: MyChart Message (if you have MyChart) OR A paper copy in the mail If you have any lab test that is abnormal or we need to change your treatment, we will call you to review the results.  Testing/Procedures: None ordered.  Follow-Up: AS needed   Important Information About Sugar

## 2021-10-03 NOTE — Progress Notes (Signed)
HPI Mia Rubio returns today for followup. She is a pleasant 41 yo woman with a long history of palpitations and panic attacks who was found to have SVT. 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 She had residual jump and echo. She has done well in the interim with no chest pain, sob, or symptoms of SVT. She notes that at times she feels like her heart is about to start racing but does not.  Allergies  Allergen Reactions   Caffeine Palpitations   Ciprofloxacin Other (See Comments)    Other reaction(s): itching   Codeine Itching   Meperidine Hcl Other (See Comments)    Other reaction(s): hives   Ondansetron Other (See Comments)    Other reaction(s): itching - can take with Benadryl   Phenergan [Promethazine] Other (See Comments)    Other reaction(s): hives   Prednisone Other (See Comments)    Other reaction(s): extreme dizziness   Septra [Sulfamethoxazole-Trimethoprim] Other (See Comments)    Other reaction(s): blurred vision, itching   Phenergan [Promethazine Hcl] Itching and Rash     Current Outpatient Medications  Medication Sig Dispense Refill   acetaminophen (TYLENOL 8 HOUR) 650 MG CR tablet 2 tablets as needed     ALPRAZolam (XANAX XR) 1 MG 24 hr tablet Take 1 mg by mouth every morning.     ALPRAZolam (XANAX) 0.5 MG tablet Take 0.5 mg by mouth daily at 6 PM.     atenolol (TENORMIN) 100 MG tablet Take 1 tablet (100 mg total) by mouth daily. 90 tablet 3   Botulinum Toxin Type A (BOTOX) 200 units SOLR PROVIDER TO INJECT 155 UNITS INTO THE MUSCLES OF THE HEAD AND NECK EVERY 12 WEEKS. DISCARD REMAINDER. 1 each 1   buPROPion (WELLBUTRIN SR) 150 MG 12 hr tablet Take 150 mg by mouth every morning.     dicyclomine (BENTYL) 20 MG tablet Take 20 mg by mouth daily as needed for spasms.     diphenhydrAMINE (BENADRYL) 25 MG tablet Take 50 mg by mouth at bedtime.     ibuprofen (ADVIL) 800 MG tablet Take 1  tablet (800 mg total) by mouth 3 (three) times daily as needed. Take 800 mg by mouth 3 (three) times daily as needed. (Patient taking differently: Take 800 mg by mouth 3 (three) times daily as needed (Migraines).) 90 tablet 11   ipratropium (ATROVENT) 0.06 % nasal spray 2 sprays in each nostril     ketorolac (TORADOL) 60 MG/2ML SOLN injection Inject 1-59ml (30-60mg ) intramuscularly at onset of migraine. May repeat in 6 hours. Max twice a day and 4 days per month. 10 mL 4   meclizine (ANTIVERT) 25 MG tablet Take 1 tablet (25 mg total) by mouth 3 (three) times daily as needed for dizziness. 30 tablet 11   Multiple Vitamin (MULTIVITAMIN PO) Take 2 tablets by mouth at bedtime.     ondansetron (ZOFRAN ODT) 8 MG disintegrating tablet Take 1 tablet (8 mg total) by mouth every 8 (eight) hours as needed for nausea or vomiting. 30 tablet 11   Potassium 99 MG TABS 1 tablet     Rimegepant Sulfate (NURTEC) 75 MG TBDP Take 75 mg by mouth daily as needed. (Patient taking differently: Take 75 mg by mouth daily as needed (Migraines).) 16 tablet 11   rizatriptan (MAXALT-MLT) 10 MG disintegrating tablet Take 1 tablet (10 mg total) by mouth as needed for migraine. May repeat in 2 hours if  needed 9 tablet 11   SLYND 4 MG TABS Take 4 mg by mouth daily.     SYRINGE-NEEDLE, DISP, 3 ML (B-D 3CC LUER-LOK SYR 25GX1") 25G X 1" 3 ML MISC Use with Toradol 4 each 5   venlafaxine XR (EFFEXOR-XR) 150 MG 24 hr capsule TAKE 1 CAPSULE(150 MG) BY MOUTH DAILY WITH BREAKFAST 90 capsule 3   zolpidem (AMBIEN) 10 MG tablet Take 10 mg by mouth at bedtime.     No current facility-administered medications for this visit.     Past Medical History:  Diagnosis Date   Common migraine with intractable migraine 12/19/2017   Hypotension    Low blood pressure    Panic disorder 12/19/2017   "something that happens when I have a flare" regarding POTS   POTS (postural orthostatic tachycardia syndrome)    SVT (supraventricular tachycardia) (HCC)     Tachycardia     ROS:   All systems reviewed and negative except as noted in the HPI.   Past Surgical History:  Procedure Laterality Date   APPENDECTOMY     BREAST SURGERY     CESAREAN SECTION N/A 08/19/2014   Procedure: CESAREAN SECTION;  Surgeon: Myna Hidalgo, DO;  Location: [facility];  Service: Obstetrics;  Laterality: N/A;   DILATION AND EVACUATION N/A 11/24/2016   Procedure: DILATATION AND EVACUATION;  Surgeon: Kirkland Hun, MD;  Location: [facility];  Service: Gynecology;  Laterality: N/A;   FOOT SURGERY  2011 2012   KNEE SURGERY  2001  2002   plantar fasciitis     SVT ABLATION N/A 08/22/2021   Procedure: SVT ABLATION;  Surgeon: Marinus Maw, MD;  Location: [facility] INV[facility] CV [facility];  Service: Cardiovascular;  Laterality: N/A;     Family History  Problem Relation Age of Onset   Cancer Maternal Grandmother        leg   Diabetes Maternal Grandmother    Heart murmur Father    Headache Maternal Great-grandmother        "sick headache", probably migraine      Social History   Socioeconomic History   Marital status: Married    Spouse name: Not on file   Number of children: Not on file   Years of education: Not on file   Highest education level: Not on file  Occupational History   Not on file  Tobacco Use   Smoking status: Former    Packs/day: 1.00    Years: 14.00    Total pack years: 14.00    Types: Cigarettes    Quit date: 2016    Years since quitting: 7.6   Smokeless tobacco: Never   Tobacco comments:    doesn't think she smoked a pack a day for the whole time   Vaping Use   Vaping Use: Never used  Substance and Sexual Activity   Alcohol use: Not Currently   Drug use: No   Sexual activity: Not on file  Other Topics Concern   Not on file  Social History Narrative   Lives at home with husband and son    Right handed   Caffeine: none    Social Determinants of Health   Financial Resource Strain: Not on file  Food Insecurity: Not on file  Transportation  Needs: Not on file  Physical Activity: Not on file  Stress: Not on file  Social Connections: Not on file  Intimate Partner Violence: Not on file     BP 130/70   Pulse 82   Ht 5'  9" (1.753 m)   Wt 193 lb 3.2 oz (87.6 kg)   SpO2 98%   BMI 28.53 kg/m   Physical Exam:  Well appearing 41 yo woman, NAD HEENT: Unremarkable Neck:  No JVD, no thyromegally Lymphatics:  No adenopathy Back:  No CVA tenderness Lungs:  Clear with no wheezes HEART:  Regular rate rhythm, no murmurs, no rubs, no clicks Abd:  soft, positive bowel sounds, no organomegally, no rebound, no guarding Ext:  2 plus pulses, no edema, no cyanosis, no clubbing Skin:  No rashes no nodules Neuro:  CN II through XII intact, motor grossly intact  EKG - nsr  DEVICE  Normal device function.  See PaceArt for details.   Assess/Plan:  SVT - she is doing well s/p EP study and catheter ablation. She will undergo watchful waiting. I encouraged her to go back to exercising.   Sharlot Gowda Mia Thor,MD

## 2021-10-23 ENCOUNTER — Encounter: Payer: Self-pay | Admitting: Internal Medicine

## 2021-10-24 ENCOUNTER — Ambulatory Visit: Payer: Medicaid Other | Admitting: Internal Medicine

## 2021-10-24 ENCOUNTER — Telehealth: Payer: Self-pay | Admitting: Internal Medicine

## 2021-10-24 NOTE — Telephone Encounter (Signed)
Called pt spouse Francis Dowse scheduled to see Dr. Graciela Husbands today at 3:45 pm.  No questions or concerns voiced.

## 2021-10-24 NOTE — Telephone Encounter (Signed)
Called pt advised that OV scheduled for today at 3:45 pm with Dr. Graciela Husbands was canceled.  Rescheduled for 12/04/21 at 3:45 pm.  Also placed on wait list.  Pt concern has been sent to Dr. Graciela Husbands to address in my chart encounter from today.

## 2021-10-24 NOTE — Telephone Encounter (Signed)
Spoke with pt apologized for appointment confusion.  Pt scheduled for OV with Dr. Graciela Husbands for 12/04/21 at 3:45 pm and placed on waitlist.   Also, wants to let Dr. Graciela Husbands know that since ablation is having SOB and CP with ambulation.  As long as lying down feels okay.  Advised pt to go to ED if symptoms worsen.  Also advised to call into the office if needed.  Verbalizes understanding.

## 2021-10-24 NOTE — Telephone Encounter (Signed)
Pt returning nurse's call. Please advise

## 2021-10-24 NOTE — Telephone Encounter (Signed)
Called both pt and spouse Francis Dowse left a message that OV for today at 3:45 pm has been canceled as Dr. Graciela Husbands does not have availability.  Advised to call our office back to reschedule.

## 2021-10-24 NOTE — Telephone Encounter (Signed)
Called pt spouse Joel scheduled to see Dr. Klein today at 3:45 pm.  No questions or concerns voiced.  

## 2021-11-20 ENCOUNTER — Telehealth: Payer: Self-pay | Admitting: Neurology

## 2021-11-20 NOTE — Telephone Encounter (Signed)
Coordinated delivery of Botox, Herbie Baltimore @ [facility] called, delivery will be Wed 09-27 this is FYI no call back requested

## 2021-11-21 ENCOUNTER — Telehealth: Payer: Self-pay

## 2021-11-21 NOTE — Telephone Encounter (Signed)
Botox PA renewal needed   Chronic Migraine CPT 64615   Botox J0585 Units:200   G43.711 Chronic Migraine without aura, intractable, with status migrainous  Provider: Sarina Ill, MD  Please let us know whether to use SP or B/B when PA is completed.

## 2021-11-23 ENCOUNTER — Telehealth (HOSPITAL_COMMUNITY): Payer: Self-pay

## 2021-11-23 NOTE — Telephone Encounter (Signed)
[employer]otox[employer] verification submitted   Key #  [id number]

## 2021-11-27 ENCOUNTER — Other Ambulatory Visit (HOSPITAL_COMMUNITY): Payer: Self-pay

## 2021-11-27 ENCOUNTER — Telehealth (HOSPITAL_COMMUNITY): Payer: Self-pay | Admitting: Pharmacy Technician

## 2021-11-27 NOTE — Telephone Encounter (Signed)
Patient Advocate Encounter  Prior Authorization for Botox 200UNIT solution  has been approved.    PA# [id number] Key: [id number] Effective dates: 11/27/2021 through 11/28/2022      Lyndel Safe, [place] Patient Advocate Specialist [place] Patient Advocate Team Direct Number: [phone]  Fax: [phone]

## 2021-11-29 ENCOUNTER — Other Ambulatory Visit: Payer: Self-pay | Admitting: Neurology

## 2021-11-30 ENCOUNTER — Encounter: Payer: Self-pay | Admitting: Neurology

## 2021-11-30 ENCOUNTER — Ambulatory Visit (INDEPENDENT_AMBULATORY_CARE_PROVIDER_SITE_OTHER): Payer: Medicaid Other | Admitting: Neurology

## 2021-11-30 ENCOUNTER — Ambulatory Visit: Payer: Medicaid Other | Admitting: Neurology

## 2021-11-30 DIAGNOSIS — G43009 Migraine without aura, not intractable, without status migrainosus: Secondary | ICD-10-CM

## 2021-11-30 DIAGNOSIS — G43711 Chronic migraine without aura, intractable, with status migrainosus: Secondary | ICD-10-CM

## 2021-11-30 MED ORDER — NURTEC 75 MG PO TBDP: 75.0000 mg | ORAL_TABLET | Freq: Every day | ORAL | 11 refills | Status: DC | PRN

## 2021-11-30 MED ORDER — ONABOTULINUMTOXINA 200 UNITS IJ SOLR
155.0000 [IU] | Freq: Once | INTRAMUSCULAR | Status: AC
  Administered 2021-11-30: 155 [IU] via INTRAMUSCULAR

## 2021-11-30 MED ORDER — PROMETHAZINE HCL 50 MG PO TABS
50.0000 mg | ORAL_TABLET | Freq: Four times a day (QID) | ORAL | 6 refills | Status: DC | PRN
Start: 2021-11-30 — End: 2022-12-17

## 2021-11-30 NOTE — Progress Notes (Signed)
Botox- 200 units x 1 vial Lot: [id number] Expiration: 04/2024 NDC: 0023-3921-02  Bacteriostatic 0.9% Sodium Chloride- 4mL total Lot: [id number] Expiration: 09/27/2022 NDC: 0409-1966-02  Dx: G43.711 S/P   

## 2021-11-30 NOTE — Progress Notes (Signed)
11/30/2021: Prior to botox daily headaches and most of the month migraines maybe even daily. Botox has changed her life. She has a lot of light sensitivity. But even the light sensitivity is better, she wears FL-41. After botox has 6 evere migraine days a month and < 14 total headache days a month significant >>50% improvement on botox in frequ and severity. Failed ubrelvy, imitrex, rizatriptan, zomig. Can try ubrelvy if nurtec not approved. Affects her ability to drive, light sensitivity affects her ability to go outside, filled out a car tinting form, still has 6 -7 severe migraine days a month and headaches, has POTS with fatigue and exercise instability. Will include a personal letter for disability claim. She cannot work 8 hours straight without restm migraines exacerbated by fatigue, lights, exertion, significantly impacting her life despite improvement by botox. Higher incidence of POTS with migraines. In acute dsitress when I see her.   09/07/2021: Botox is working well.  06/14/2021: Reports botox working well. Two weeks late for botox so she has had more migraines. 1/ week migraine when taking botox consistently. Never severe.    BOTOX PROCEDURE NOTE FOR MIGRAINE HEADACHE    Contraindications and precautions discussed with patient(above). Aseptic procedure was observed and patient tolerated procedure. Procedure performed by Butch Penny, NP  The condition has existed for more than 6 months, and pt does not have a diagnosis of ALS, Myasthenia Gravis or Lambert-Eaton Syndrome.  Risks and benefits of injections discussed and pt agrees to proceed with the procedure.  Written consent obtained  These injections are medically necessary. These injections do not cause sedations or hallucinations which the oral therapies may cause.  Indication/Diagnosis: chronic migraine BOTOX(J0585) injection was performed according to protocol by [employer]. 200 units of BOTOX was dissolved into 4 cc NS.   NDC:  [address]-75  Type of toxin: Botox  Botox- 200 units x 1 vial Lot: [id number] Expiration: 05/2023 NDC: 5993-5701-77   0.9% Sodium Chloride- 26mL total Lot: [id number] Expiration: 09/27/2022 NDC: 0923-3007-62   Dx: U63.335  Description of procedure:  The patient was placed in a sitting position. The standard protocol was used for Botox as follows, with 5 units of Botox injected at each site:   -Procerus muscle, midline injection  -Corrugator muscle, bilateral injection  -Frontalis muscle, bilateral injection, with 2 sites each side, medial injection was performed in the upper one third of the frontalis muscle, in the region vertical from the medial inferior edge of the superior orbital rim. The lateral injection was again in the upper one third of the forehead vertically above the lateral limbus of the cornea, 1.5 cm lateral to the medial injection site.  -Temporalis muscle injection, 4 sites, bilaterally. The first injection was 3 cm above the tragus of the ear, second injection site was 1.5 cm to 3 cm up from the first injection site in line with the tragus of the ear. The third injection site was 1.5-3 cm forward between the first 2 injection sites. The fourth injection site was 1.5 cm posterior to the second injection site.  -Occipitalis muscle injection, 3 sites, bilaterally. The first injection was done one half way between the occipital protuberance and the tip of the mastoid process behind the ear. The second injection site was done lateral and superior to the first, 1 fingerbreadth from the first injection. The third injection site was 1 fingerbreadth superiorly and medially from the first injection site.  -Cervical paraspinal muscle injection, 2 sites, bilateral knee first injection site was 1  cm from the midline of the cervical spine, 3 cm inferior to the lower border of the occipital protuberance. The second injection site was 1.5 cm superiorly and laterally to the first injection  site.  -Trapezius muscle injection was performed at 3 sites, bilaterally. The first injection site was in the upper trapezius muscle halfway between the inflection point of the neck, and the acromion. The second injection site was one half way between the acromion and the first injection site. The third injection was done between the first injection site and the inflection point of the neck.   Will return for repeat injection in 3 months.   A 200 units of Botox was used, 155 units were injected, the rest of the Botox was wasted. The patient tolerated the procedure well, there were no complications of the above procedure.  Ward Givens, MSN, NP-C 11/30/2021, 4:20 PM [facility] [address], [place], [place] [address] [phone]

## 2021-12-04 ENCOUNTER — Encounter: Payer: Self-pay | Admitting: Neurology

## 2021-12-04 ENCOUNTER — Encounter: Payer: Self-pay | Admitting: Internal Medicine

## 2021-12-04 ENCOUNTER — Ambulatory Visit: Payer: Medicaid Other | Attending: Internal Medicine | Admitting: Internal Medicine

## 2021-12-04 VITALS — BP 98/66 | HR 115 | Wt 193.2 lb

## 2021-12-04 DIAGNOSIS — I951 Orthostatic hypotension: Secondary | ICD-10-CM

## 2021-12-04 DIAGNOSIS — I471 Supraventricular tachycardia, unspecified: Secondary | ICD-10-CM | POA: Diagnosis not present

## 2021-12-04 DIAGNOSIS — G90A Postural orthostatic tachycardia syndrome (POTS): Secondary | ICD-10-CM | POA: Diagnosis not present

## 2021-12-04 MED ORDER — ATENOLOL 25 MG PO TABS: 25.0000 mg | ORAL_TABLET | Freq: Every day | ORAL | 3 refills | Status: DC

## 2021-12-04 NOTE — Progress Notes (Signed)
And 4-second pauses and A-fib are okay anyway but they were all in the middle of the     Patient Care Team: Carolee Rota, NP as PCP - General (Nurse Practitioner) Antony Blackbird, MD as Referring Physician (Family Medicine)   HPI  Mia Rubio is a 41 y.o. female Seen in follow-up POTS  Her dizziness in the past has been recumbent associated with normal vital signs which to me suggested a noncardiac perhaps neuropsychiatric origin. It was recommended that she be referred for consideration of these components. There also was a component of heat and orthostatic intolerance.  An abdominal binder helped considerably    She is here having lost 2 children miscarriages in the last 6 months.  The first was associated with profound and persistent bleeding and resulted in exercise intolerance bedrest and profound dizziness.  She recalls that when she had hormonal suppression of her periods she felt the best that she had in years.  She and her husband are continuing to try to have a second child.  Hence got pregnant again and she miscarried her second child about 6 months ago.  That is also the timeframe in which she began to have significantly worsened symptoms.  When seen 3/19, >>"She says she is barely able to sit up without being dizzy.  She is not able to stand well.  Fluorescent lights are bothersome.  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    Complains of shortness of breath and since then has had worsening complaints of dyspnea, some chest pain including something that took her to the ER, exercise intolerance fatigue and sleepiness.  Her atenolol has been gradually uptitrated because of recurrent SVT.  She is mostly in bed.  Depressed.  Feels like is never really get  better and her body is just failed her.  Feels like a burden in her marriage as her husband is having to do all the work.  Struggles with sleep.  Falls asleep and then wakes up and cannot go back to sleep.  Neurology has suggested sleep study    Date Cr K Hgb  3/21  0.81   4.3   7/23  0.94 4.0 14.9       Past Medical History:  Diagnosis Date   Common migraine with intractable migraine 12/19/2017   Hypotension    Low blood pressure    Panic disorder 12/19/2017   "something that happens when I have a flare" regarding POTS   POTS (postural orthostatic tachycardia syndrome)    SVT (supraventricular tachycardia)    Tachycardia     Past Surgical History:  Procedure Laterality Date   APPENDECTOMY     BREAST SURGERY     CESAREAN SECTION N/A 08/19/2014   Procedure: CESAREAN SECTION;  Surgeon: Janyth Pupa, DO;  Location: [facility];  Service: Obstetrics;  Laterality: N/A;   DILATION AND EVACUATION N/A 11/24/2016   Procedure: DILATATION AND EVACUATION;  Surgeon: Ena Dawley, MD;  Location: [facility];  Service: Gynecology;  Laterality: N/A;   FOOT SURGERY  2011 2012   KNEE SURGERY  2001  2002   plantar fasciitis     SVT ABLATION N/A 08/22/2021   Procedure: SVT ABLATION;  Surgeon: Evans Lance, MD;  Location: [facility];  Service: Cardiovascular;  Laterality:  N/A;    Current Outpatient Medications  Medication Sig Dispense Refill   acetaminophen (TYLENOL 8 HOUR) 650 MG CR tablet 2 tablets as needed     ALPRAZolam (XANAX XR) 1 MG 24 hr tablet Take 1 mg by mouth every morning.     ALPRAZolam (XANAX) 0.5 MG tablet Take 0.5 mg by mouth daily at 6 PM.     atenolol (TENORMIN) 25 MG tablet Take 1 tablet (25 mg total) by mouth daily. 90 tablet 3   Botulinum Toxin Type A (BOTOX) 200 units SOLR PROVIDER TO INJECT 155 UNITS INTO THE MUSCLES OF THE HEAD AND NECK EVERY 12 WEEKS. DISCARD REMAINDER. 1 each 1   buPROPion (WELLBUTRIN SR) 150 MG 12 hr tablet Take 150 mg by mouth every morning.      dicyclomine (BENTYL) 20 MG tablet Take 20 mg by mouth daily as needed for spasms.     diphenhydrAMINE (BENADRYL) 25 MG tablet Take 50 mg by mouth at bedtime.     ibuprofen (ADVIL) 800 MG tablet Take 1 tablet (800 mg total) by mouth 3 (three) times daily as needed. Take 800 mg by mouth 3 (three) times daily as needed. (Patient taking differently: Take 800 mg by mouth 3 (three) times daily as needed (Migraines).) 90 tablet 11   ipratropium (ATROVENT) 0.06 % nasal spray 2 sprays in each nostril     ketorolac (TORADOL) 60 MG/2ML SOLN injection Inject 1-9ml (30-60mg ) intramuscularly at onset of migraine. May repeat in 6 hours. Max twice a day and 4 days per month. 10 mL 4   meclizine (ANTIVERT) 25 MG tablet Take 1 tablet (25 mg total) by mouth 3 (three) times daily as needed for dizziness. 30 tablet 11   Multiple Vitamin (MULTIVITAMIN PO) Take 2 tablets by mouth at bedtime.     ondansetron (ZOFRAN ODT) 8 MG disintegrating tablet Take 1 tablet (8 mg total) by mouth every 8 (eight) hours as needed for nausea or vomiting. 30 tablet 11   Potassium 99 MG TABS 1 tablet     promethazine (PHENERGAN) 50 MG tablet Take 1 tablet (50 mg total) by mouth every 6 (six) hours as needed for nausea or vomiting. Take with benadryl for itching reaction. Can take with nurtec or rizatriptan for migraine. May cause sedation. 30 tablet 6   Rimegepant Sulfate (NURTEC) 75 MG TBDP Take 75 mg by mouth daily as needed. (Patient taking differently: Take 75 mg by mouth daily as needed (Migraines).) 16 tablet 11   Rimegepant Sulfate (NURTEC) 75 MG TBDP Take 75 mg by mouth daily as needed. For migraines. Take as close to onset of migraine as possible. One daily maximum. 16 tablet 11   rizatriptan (MAXALT-MLT) 10 MG disintegrating tablet Take 1 tablet (10 mg total) by mouth as needed for migraine. May repeat in 2 hours if needed 9 tablet 11   SLYND 4 MG TABS Take 4 mg by mouth daily.     SYRINGE-NEEDLE, DISP, 3 ML (B-D 3CC LUER-LOK SYR  25GX1") 25G X 1" 3 ML MISC Use with Toradol 4 each 5   venlafaxine XR (EFFEXOR-XR) 150 MG 24 hr capsule TAKE 1 CAPSULE(150 MG) BY MOUTH DAILY WITH BREAKFAST 90 capsule 0   zolpidem (AMBIEN) 10 MG tablet Take 10 mg by mouth at bedtime.     No current facility-administered medications for this visit.    Allergies  Allergen Reactions   Caffeine Palpitations   Ciprofloxacin Other (See Comments)    Other reaction(s): itching   Codeine Itching  Meperidine Hcl Other (See Comments)    Other reaction(s): hives   Ondansetron Other (See Comments)    Other reaction(s): itching - can take with Benadryl   Phenergan [Promethazine] Other (See Comments)    Other reaction(s): hives   Prednisone Other (See Comments)    Other reaction(s): extreme dizziness   Septra [Sulfamethoxazole-Trimethoprim] Other (See Comments)    Other reaction(s): blurred vision, itching   Phenergan [Promethazine Hcl] Itching and Rash      Review of Systems negative except from HPI and PMH  Physical Exam BP 98/66 (BP Location: Right Arm, Patient Position: Standing)   Pulse (!) 115   Wt 193 lb 3.2 oz (87.6 kg)   SpO2 99%   BMI 28.53 kg/m  Well developed and nourished in no acute distress HENT normal Neck   No Clubbing cyanosis edema Skin-warm and dry A & Oriented  Grossly normal sensory and motor function  ECG sinus at 82 Intervals 13/09/35   Assessment and  Plan  Syncope  Orthostatic and heat intolerance  Postural Tachycardia  Nonpositional tachycardia  Elevated blood pressure  Miscarriages--multiple  Anxiety  SVT status post ablation with subsequent dysautonomic deep compensation  Beta-blockers have been very helpful with her symptoms, perhaps more for her SVT than her POTS, but she had conflated the 2, as had.  With her orthostatic tachycardia and some hypotension, we will wean her down from 100--25 mg a day over the next 4 weeks.  Encouraged her to undertake standing training for 1 to 2  minutes at a time for 5 times a day.  Raise head of her bed  Discussed mental health and the importance of counseling for her and perhaps for her them as a couple  59 min

## 2021-12-04 NOTE — Patient Instructions (Signed)
Medication Instructions:  Your physician has recommended you make the following change in your medication:   ** Decrease Atenolol 100mg  - to 1/2 tablet (50mg ) by mouth x 2 weeks then to 25mg  thereafter.  I have sent in a new prescription for Atenolol 25mg  - 1 tablet by mouth daily to the pharmacy we have on file.  *If you need a refill on your cardiac medications before your next appointment, please call your pharmacy*   Lab Work: None ordered.  If you have labs (blood work) drawn today and your tests are completely normal, you will receive your results only by: [place] (if you have MyChart) OR A paper copy in the mail If you have any lab test that is abnormal or we need to change your treatment, we will call you to review the results.   Testing/Procedures: None ordered.    Follow-Up: At [facility], you and your health needs are our priority.  As part of our continuing mission to provide you with exceptional heart care, we have created designated Provider Care Teams.  These Care Teams include your primary Cardiologist (physician) and Advanced Practice Providers (APPs -  Physician Assistants and Nurse Practitioners) who all work together to provide you with the care you need, when you need it.  We recommend signing up for the patient portal called "MyChart".  Sign up information is provided on this After Visit Summary.  MyChart is used to connect with patients for Virtual Visits (Telemedicine).  Patients are able to view lab/test results, encounter notes, upcoming appointments, etc.  Non-urgent messages can be sent to your provider as well.   To learn more about what you can do with MyChart, go to [email or web address].    Your next appointment:   6 months with Dr Caryl Comes  Important Information About Sugar

## 2021-12-05 ENCOUNTER — Encounter: Payer: Self-pay | Admitting: Internal Medicine

## 2021-12-06 ENCOUNTER — Other Ambulatory Visit: Payer: Self-pay | Admitting: Neurology

## 2021-12-11 ENCOUNTER — Other Ambulatory Visit: Payer: Self-pay | Admitting: Neurology

## 2021-12-12 ENCOUNTER — Telehealth: Payer: Self-pay | Admitting: Neurology

## 2021-12-12 NOTE — Telephone Encounter (Signed)
Please schedule patient for a video follow up withme first available please to discuss a sleep study thanks!

## 2021-12-12 NOTE — Telephone Encounter (Signed)
LVM asking pt to call back to schedule follow up 

## 2021-12-12 NOTE — Telephone Encounter (Signed)
Pt returned call. Pt was scheduled a Mychart Visit on the 24th of this month.

## 2021-12-19 ENCOUNTER — Telehealth: Payer: Self-pay | Admitting: Neurology

## 2021-12-19 ENCOUNTER — Encounter: Payer: Self-pay | Admitting: Internal Medicine

## 2021-12-19 ENCOUNTER — Telehealth (INDEPENDENT_AMBULATORY_CARE_PROVIDER_SITE_OTHER): Payer: Medicaid Other | Admitting: Neurology

## 2021-12-19 DIAGNOSIS — R519 Headache, unspecified: Secondary | ICD-10-CM

## 2021-12-19 DIAGNOSIS — G9001 Carotid sinus syncope: Secondary | ICD-10-CM | POA: Diagnosis not present

## 2021-12-19 DIAGNOSIS — G908 Other disorders of autonomic nervous system: Secondary | ICD-10-CM

## 2021-12-19 DIAGNOSIS — G90A Postural orthostatic tachycardia syndrome (POTS): Secondary | ICD-10-CM

## 2021-12-19 DIAGNOSIS — Z09 Encounter for follow-up examination after completed treatment for conditions other than malignant neoplasm: Secondary | ICD-10-CM | POA: Diagnosis not present

## 2021-12-19 DIAGNOSIS — G43709 Chronic migraine without aura, not intractable, without status migrainosus: Secondary | ICD-10-CM

## 2021-12-19 DIAGNOSIS — R0602 Shortness of breath: Secondary | ICD-10-CM

## 2021-12-19 DIAGNOSIS — G9089 Other disorders of autonomic nervous system: Secondary | ICD-10-CM

## 2021-12-19 DIAGNOSIS — G4719 Other hypersomnia: Secondary | ICD-10-CM

## 2021-12-19 DIAGNOSIS — K3184 Gastroparesis: Secondary | ICD-10-CM

## 2021-12-19 DIAGNOSIS — Z72820 Sleep deprivation: Secondary | ICD-10-CM

## 2021-12-19 NOTE — Addendum Note (Signed)
Addended by: Brandon Melnick on: 12/19/2021 02:46 PM   Modules accepted: Orders

## 2021-12-19 NOTE — Telephone Encounter (Signed)
Referral for Neurology fax to [facility]. Phone: [phone], Fax: [phone]

## 2021-12-19 NOTE — Telephone Encounter (Signed)
Please order/send her an overnight puls oximeter to test for nocturnal hypoxemia (she does not use oxygen) due to morning headaches

## 2021-12-19 NOTE — Progress Notes (Signed)
[facility]    Provider:  Dr Lucia Gaskins Requesting Provider:  Gentry Roch MD Primary Care Provider:  Kae Heller  Virtual Visit via Video Note  I connected with [name] on 12/19/21 at  1:00 PM EDT by a video enabled telemedicine application and verified that I am speaking with the correct person using two identifiers.  Location: Patient: home Provider: office   I discussed the limitations of evaluation and management by telemedicine and the availability of in person appointments. The patient expressed understanding and agreed to proceed.   Follow Up Instructions:    I discussed the assessment and treatment plan with the patient. The patient was provided an opportunity to ask questions and all were answered. The patient agreed with the plan and demonstrated an understanding of the instructions.   The patient was advised to call back or seek an in-person evaluation if the symptoms worsen or if the condition fails to improve as anticipated.  I provided over 40 minutes of non-face-to-face time during this encounter.   [name], MD   CC:  Headaches, generalized light sensitivity, vestibular issues and visual disturbance  12/19/2021: 2 weeks ago she went to cardiology and he said the ablation damaged her autonomic nerves including her phrenic and the vagus. May be from entry point near the carotid nerve neck catheter(?). She had a lot of bruising at the entry point. Since then she is having a lot of symptoms: she is having shortness of breath, anxiety, insomnia, morning headache, excessive daytime somnolence, orthopnea(has to use pillows to breath), she sleeps all day, extremely fatigued, naps all day, she is not comfortable driging and her cardiologist said she should not drive, she has dizziness, temporal facial pain (MRI of the brain and orbits 09/08/2021) but Carotidynia, also known as Fay syndrome or TIPIC syndrome, is a very rare vascular disorder  presenting with unilateral neck and facial pain around the eyes. Pain in the right (points to the carotid artery). Difficulty swallowing (recommend talk to Mitzi Hansen or [facility])  Patient complains of symptoms per HPI as well as the following symptoms: neck pain, head pain, dizziness, SOB . Pertinent negatives and positives per HPI. All others negative   11/30/2021: Botox appointment: Prior to botox daily headaches and most of the month migraines maybe even daily. Botox has changed her life. She has a lot of light sensitivity. But even the light sensitivity is better, she wears FL-41. After botox has 6 evere migraine days a month and < 14 total headache days a month significant >>50% improvement on botox in frequ and severity. Failed ubrelvy, imitrex, rizatriptan, zomig. Can try ubrelvy if nurtec not approved. Affects her ability to drive, light sensitivity affects her ability to go outside, filled out a car tinting form, still has 6 -7 severe migraine days a month and headaches, has POTS with fatigue and exercise instability. Will include a personal letter for disability claim. She cannot work 8 hours straight without restm migraines exacerbated by fatigue, lights, exertion, significantly impacting her life despite improvement by botox. Higher incidence of POTS with migraines. In acute dsitress when I see her.   09/07/2021: Botox appointment: is working well.  06/14/2021: Botox appointment: Reports botox working well. Two weeks late for botox so she has had more migraines. 1/ week migraine when taking botox consistently. Never severe.   08/15/2021; Patient is here for new chief complaint sharp pains behind eyes. PMHx migraines doing well on botox, POTS, anxiety. She is clenching  her teeth a lot. This is completely different than the migraines. She says she gets brain zaps, used to get them once a month now it is throughout the day multiple times a day, in her eyeballs in her eyeballs and  tongue when she moves her eyes laterally or medially. Not coming from anywhere actually in the eyeball. She saw the eye doctor and everything was fine. She feels like she has a film over her eyes, blurry vision, diplopia, pain on eye movement, brief, sharp, not radiating. 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 We also discussed emgality.   Patient complains of symptoms per HPI as well as the following symptoms: eye pain . Pertinent negatives and positives per HPI. All others negative   MRI brain 04/2020: reviewed images and agree: IMPRESSION: Slightly abnormal MRI scan of the brain with and without contrast showing tiny scattered bilateral supratentorial white matter hyperintensities with a differential discussed above.  07/2021 cbc/bmp unremarkable  HPI:  Mia Rubio is a 41 y.o. female here as requested by Mitzi Hansen, NP for headaches, visual disturbance, vestibular issues.  She has a past medical history of POTS, anxiety, seeing psychiatry to establish care.  I reviewed Dr. Glennon Mac notes, patient was seen there for visual disturbance, patient stated her vision seemed fine with glasses but she noted issues with three-dimensional perception in which some objects in her vision seem to move further closer away, exacerbated with motion such as riding the lawnmower or driving in the car.  She has no true diplopia, she says she has not been driving in several years because the depth perception problem seems to be an issue, light sensitivity in general and lights can sometimes trigger migraine headaches or scintillating scotoma, the visual aura can occur a number of times a week, she seen a neurologist does not trial of migraine medications, she has POTS disease and is on atenolol but does not think this is related and is connected to her POTS.  Dr. Marcial Pacas Martin's examination showed visual acuity of 20/20 and 20/25, small anisocoria with the right pupil larger than the left about the same in darkness and  light, no relative apparent pupillary defect, full visual fields, full extraocular movements, slit-lamp and fundus exam normal, diagnosis "a relatively normal neuro ophthalmic examination ", minimal evidence of dry eye syndrome, may be a migraine related issue.  Patient saw Dr. Anne Hahn in October 2019, female with a history of migraine headache, migraines headaches around 2010, MRI of the brain unremarkable, patient has POTS since 2010, diagnosed with POTS in 2017, on Florinef, longstanding history of motion sickness, rapid head movements and fluorescent lights may worsen her symptoms, lots of anxiety issues and now having panic attacks, denied any other focal neurologic symptoms.  She has light sensitivity, she has not driven since she has light sensitivity. She has tremors in hre hands. She has "lack of oxygen to the brain" because of a POTS flare, she has bad memory and can;t get words out, eyes tremor when she wakes up(dry eye?), when she is driving she may get hit by a ray of light and it caused her vision to start going away from her, causes anxiety and then a POTS flare, headlights at night trigger it, if she takes xanax she can avoid this and keeps her calm so she can enjoy loud music for example, she has auras twice a day, Migraines do not always have an aura and she tends to have auras without headaches, but can happen with  headache, feels like they start slow, the aura is annoying but doesn't stop her from doing anything.  28 headache days a month, 15 migraine days a month without aura that are moderately severe, can be unilateral, pulsating/pounding/throbbing, movement makes it worse, photophobia, nausea, last up to 24 hours to 72 hours, ongoing for > 1 year at this severity and frequency, no medication overuse. Patient has blurry vision with the headaches, wakes with them and can be worse supine. No other focal neurologic deficits, associated symptoms, inciting events or modifiable  factors.  Patient endorses trial periods more than 3 months in the past for these medications.  Medications tried that can be used in migraine management includes: Atenolol(tried moe than 3 months),Amitriptyline(tried more than 3 months), venlafaxine(ried more than 3 months), sumatriptan, topamax(just started), amitriptyline, maxalt, gabapentin(more than 3 months trial)  Reviewed notes, labs and imaging from outside physicians, which showed: see above  05/21/2019: BMP normal  Review of Systems: Patient complains of symptoms per HPI as well as the following symptoms:light sensitivity. Pertinent negatives and positives per HPI. All others negative.   Social History   Socioeconomic History   Marital status: Married    Spouse name: Not on file   Number of children: Not on file   Years of education: Not on file   Highest education level: Not on file  Occupational History   Not on file  Tobacco Use   Smoking status: Former    Packs/day: 1.00    Years: 14.00    Total pack years: 14.00    Types: Cigarettes    Quit date: 2016    Years since quitting: 7.8   Smokeless tobacco: Never   Tobacco comments:    doesn't think she smoked a pack a day for the whole time   Vaping Use   Vaping Use: Never used  Substance and Sexual Activity   Alcohol use: Not Currently   Drug use: No   Sexual activity: Not on file  Other Topics Concern   Not on file  Social History Narrative   Lives at home with husband and son    Right handed   Caffeine: none    Social Determinants of Health   Financial Resource Strain: Not on file  Food Insecurity: Not on file  Transportation Needs: Not on file  Physical Activity: Not on file  Stress: Not on file  Social Connections: Not on file  Intimate Partner Violence: Not on file    Family History  Problem Relation Age of Onset   Cancer Maternal Grandmother        leg   Diabetes Maternal Grandmother    Heart murmur Father    Headache Maternal  Great-grandmother        "sick headache", probably migraine     Past Medical History:  Diagnosis Date   Common migraine with intractable migraine 12/19/2017   Hypotension    Low blood pressure    Panic disorder 12/19/2017   "something that happens when I have a flare" regarding POTS   POTS (postural orthostatic tachycardia syndrome)    SVT (supraventricular tachycardia)    Tachycardia     Patient Active Problem List   Diagnosis Date Noted   SVT (supraventricular tachycardia)    Orthostasis    Chronic migraine without aura without status migrainosus, not intractable 04/28/2020   Migraine with aura and without status migrainosus, not intractable 04/28/2020   Light sensitivity 04/28/2020   POTS (postural orthostatic tachycardia syndrome) 12/19/2017   Panic disorder  12/19/2017   Common migraine with intractable migraine 12/19/2017   Missed abortion 11/24/2016   Intrauterine normal pregnancy 08/19/2014    Past Surgical History:  Procedure Laterality Date   APPENDECTOMY     BREAST SURGERY     CESAREAN SECTION N/A 08/19/2014   Procedure: CESAREAN SECTION;  Surgeon: Janyth Pupa, DO;  Location: [facility];  Service: Obstetrics;  Laterality: N/A;   DILATION AND EVACUATION N/A 11/24/2016   Procedure: DILATATION AND EVACUATION;  Surgeon: Ena Dawley, MD;  Location: [facility];  Service: Gynecology;  Laterality: N/A;   FOOT SURGERY  2011 2012   KNEE SURGERY  2001  2002   plantar fasciitis     SVT ABLATION N/A 08/22/2021   Procedure: SVT ABLATION;  Surgeon: Evans Lance, MD;  Location: [facility];  Service: Cardiovascular;  Laterality: N/A;    Current Outpatient Medications  Medication Sig Dispense Refill   acetaminophen (TYLENOL 8 HOUR) 650 MG CR tablet 2 tablets as needed     ALPRAZolam (XANAX XR) 1 MG 24 hr tablet Take 1 mg by mouth every morning.     ALPRAZolam (XANAX) 0.5 MG tablet Take 0.5 mg by mouth daily at 6 PM.     atenolol (TENORMIN) 25 MG tablet Take 1 tablet  (25 mg total) by mouth daily. 90 tablet 3   Botulinum Toxin Type A (BOTOX) 200 units SOLR PROVIDER TO INJECT 155 UNITS INTO THE MUSCLES OF THE HEAD AND NECK EVERY 12 WEEKS. DISCARD REMAINDER. 1 each 1   buPROPion (WELLBUTRIN SR) 150 MG 12 hr tablet Take 150 mg by mouth every morning.     dicyclomine (BENTYL) 20 MG tablet Take 20 mg by mouth daily as needed for spasms.     diphenhydrAMINE (BENADRYL) 25 MG tablet Take 50 mg by mouth at bedtime.     ibuprofen (ADVIL) 800 MG tablet TAKE 1 TABLET(800 MG) BY MOUTH THREE TIMES DAILY AS NEEDED 90 tablet 11   ipratropium (ATROVENT) 0.06 % nasal spray 2 sprays in each nostril     ketorolac (TORADOL) 60 MG/2ML SOLN injection Inject 1-20ml (30-60mg ) intramuscularly at onset of migraine. May repeat in 6 hours. Max twice a day and 4 days per month. 10 mL 4   meclizine (ANTIVERT) 25 MG tablet TAKE 1 TABLET(25 MG) BY MOUTH THREE TIMES DAILY AS NEEDED FOR DIZZINESS 30 tablet 0   Multiple Vitamin (MULTIVITAMIN PO) Take 2 tablets by mouth at bedtime.     ondansetron (ZOFRAN ODT) 8 MG disintegrating tablet Take 1 tablet (8 mg total) by mouth every 8 (eight) hours as needed for nausea or vomiting. 30 tablet 11   Potassium 99 MG TABS 1 tablet     promethazine (PHENERGAN) 50 MG tablet Take 1 tablet (50 mg total) by mouth every 6 (six) hours as needed for nausea or vomiting. Take with benadryl for itching reaction. Can take with nurtec or rizatriptan for migraine. May cause sedation. 30 tablet 6   Rimegepant Sulfate (NURTEC) 75 MG TBDP Take 75 mg by mouth daily as needed. (Patient taking differently: Take 75 mg by mouth daily as needed (Migraines).) 16 tablet 11   Rimegepant Sulfate (NURTEC) 75 MG TBDP Take 75 mg by mouth daily as needed. For migraines. Take as close to onset of migraine as possible. One daily maximum. 16 tablet 11   rizatriptan (MAXALT-MLT) 10 MG disintegrating tablet Take 1 tablet (10 mg total) by mouth as needed for migraine. May repeat in 2 hours if  needed 9 tablet  11   SLYND 4 MG TABS Take 4 mg by mouth daily.     SYRINGE-NEEDLE, DISP, 3 ML (B-D 3CC LUER-LOK SYR 25GX1") 25G X 1" 3 ML MISC Use with Toradol 4 each 5   venlafaxine XR (EFFEXOR-XR) 150 MG 24 hr capsule TAKE 1 CAPSULE(150 MG) BY MOUTH DAILY WITH BREAKFAST 90 capsule 0   zolpidem (AMBIEN) 10 MG tablet Take 10 mg by mouth at bedtime.     No current facility-administered medications for this visit.    Allergies as of 12/19/2021 - Review Complete 12/04/2021  Allergen Reaction Noted   Caffeine Palpitations 07/14/2015   Ciprofloxacin Other (See Comments) 11/30/2019   Codeine Itching 11/28/2011   Meperidine hcl Other (See Comments) 11/30/2019   Ondansetron Other (See Comments) 11/30/2019   Phenergan [promethazine] Other (See Comments) 11/30/2019   Prednisone Other (See Comments) 11/30/2019   Septra [sulfamethoxazole-trimethoprim] Other (See Comments) 11/30/2019   Phenergan [promethazine hcl] Itching and Rash 05/02/2013    Vitals: There were no vitals taken for this visit. Last Weight:  Wt Readings from Last 1 Encounters:  12/04/21 193 lb 3.2 oz (87.6 kg)   Last Height:   Ht Readings from Last 1 Encounters:  10/03/21 5\' 9"  (1.753 m)   Physical exam: Exam: Gen: NAD, conversant      CV: Denies palpitations or chest pain or SOB. VS: Breathing at a normal rate.  Not febrile. Eyes: Conjunctivae clear without exudates or hemorrhage  Neuro: Detailed Neurologic Exam  Speech:    Speech is normal; fluent and spontaneous with normal comprehension.  Cognition:    The patient is oriented to person, place, and time;     recent and remote memory intact;     language fluent;     normal attention, concentration,     fund of knowledge Cranial Nerves:    The pupils are equal, round, and reactive to light. Attempted, Cannot perform fundoscopic exam. Visual fields are full to finger confrontation. Extraocular movements are intact.  The face is symmetric with normal sensation.  The palate elevates in the midline. Hearing intact. Voice is normal. Shoulder shrug is normal. The tongue has normal motion without fasciculations.   Coordination:    Normal finger to nose  Gait:    Normal native gait  Motor Observation:   no involuntary movements noted. Tone:    Appears normal  Posture:    Posture is normal. normal erect    Strength:    Strength is anti-gravity and symmetric in the upper and lower limbs.      Sensation: intact to LT        Assessment/Plan:  41 year old with chronic migraine, failed multiple classes of medications. Doing well on botox now with only 4 migraine days a month and < 10 total headache days a month. Here for new symptoms of electrical shocks in her eyes, blurry vision/diplopia, pain on eye movement, could be atypical trigeminal neuralgia or possibly something like orbital pseudotumor very unusual need MRi brain and orbits.Also having occipital neuralgia willperform nerve blocks.  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 Dohmeier for morning headaches and hypoxemia overnight but iniVickey Hugery will order ONO overnight to measure oxygenation.  - Difficulty swallowing (discuss with [facility] Client if pulmonology is an appropriate referral not my area of expertise) -[facility]: Pots, gastric stasis, shortness, was told possible vagus nerve injury, send to [facility] autonomic  clinic - Migraine: already on multiple meds botox, emgality, atenolol, nurtec, phenergan,  effexor and tried multiple others will wait and see what the work up above shows  Instructions to patient: Send an oxygen monitor overnight and can order a sleep study if needed CXR - walk in to [place] [facility] dysautonomia clinic CT Angiography of the carotid artery in the neck Swallowing and SOB - discuss these symptoms and others also with pcp  Not sure I can help with phrenic or carotid nerve damage as reported by patient may need to send to dysautonomia clinic at [facility], she has pots and multiple symptoms of dysautonomia, also speak to her primary care  Light sensitivity: - She borrowed my somilight glasses and allay lamp, she tried them out for her light sensitivity - will send toradol injections to [facility] to use sparingly - will try nurtec acutely (failed sumatriptan, rizatriptan, zomig) - discussed: FL-41 tinting (somnilight or theraspecs), Car tinting (et online for additonal tinting), Allay light, Emgality in the future if needed*    Orders Placed This Encounter  Procedures   CT ANGIO NECK W OR WO CONTRAST   DG Chest 2 View   Ambulatory referral to Neurology   Ambulatory referral to Sleep Studies    Cc: Gentry Roch MD  Kae Heller  Naomie Dean, MD  [facility] [address] [place], [place] [address]  Phone [phone] Fax [phone]  I spent over 40 minutes on 1. Chronic migraine without aura without status migrainosus, not intractable   2. Carotidynia   3. Follow-up examination after carotid surgery   4. Shortness of breath   5. POTS (postural orthostatic tachycardia syndrome)   6. Gastric stasis   7. Dysautonomia-like disorder   8. Morning headache   9. Excessive daytime sleepiness   10. Poor sleep     diagnosis.  This included previsit chart review, lab review, study review, order entry, electronic health record  documentation, patient education on the different diagnostic and therapeutic options, counseling and coordination of care, risks and benefits of management, compliance, or risk factor reduction. This does not include time spent on nerve blocks

## 2021-12-19 NOTE — Patient Instructions (Addendum)
Send an oxygen monitor overnight CXR - walk in to [place] [facility] dysautonomia clinic CT Angiography of the carotid artery in the neck Swallowing and SOB - discuss these symptoms and others also with pcp Sleep evaluation but in the meantime will order O2 monitor overnight

## 2021-12-19 NOTE — Telephone Encounter (Signed)
Order placed, will send to [facility] once cosigned.

## 2021-12-20 ENCOUNTER — Telehealth: Payer: Self-pay | Admitting: Neurology

## 2021-12-20 NOTE — Telephone Encounter (Signed)
[employer] medicaid Josem Kaufmann: [id number] exp. 12/20/21-02/03/22 sent to GI [phone]

## 2021-12-26 ENCOUNTER — Telehealth: Payer: Self-pay | Admitting: *Deleted

## 2021-12-26 NOTE — Telephone Encounter (Signed)
[place] thru Medicaid form fax confirmation received.  Back on BC/RN desk.

## 2021-12-27 NOTE — Telephone Encounter (Signed)
Per [facility] medicaid community plan, Nurtec approved through 12/27/2022. Case ID: [id number]  Faxed approval to pharmacy. Received a receipt of confirmation.

## 2022-01-09 ENCOUNTER — Encounter: Payer: Self-pay | Admitting: Neurology

## 2022-01-10 ENCOUNTER — Other Ambulatory Visit (HOSPITAL_COMMUNITY): Payer: Self-pay

## 2022-01-10 ENCOUNTER — Encounter: Payer: Self-pay | Admitting: Pulmonary Disease

## 2022-01-10 ENCOUNTER — Ambulatory Visit (INDEPENDENT_AMBULATORY_CARE_PROVIDER_SITE_OTHER): Payer: Medicaid Other | Admitting: Pulmonary Disease

## 2022-01-10 ENCOUNTER — Ambulatory Visit (INDEPENDENT_AMBULATORY_CARE_PROVIDER_SITE_OTHER): Payer: Medicaid Other

## 2022-01-10 VITALS — BP 124/68 | HR 89 | Ht 69.0 in | Wt 197.4 lb

## 2022-01-10 DIAGNOSIS — R0609 Other forms of dyspnea: Secondary | ICD-10-CM

## 2022-01-10 LAB — D-DIMER, QUANTITATIVE: D-Dimer, Quant: 0.34 mcg/mL FEU (ref <0.50)

## 2022-01-10 NOTE — Progress Notes (Signed)
_0  ID: Mia Rubio, female    DOB: 04-13-1980, 41 y.o.   MRN: [id number]  Chief Complaint  Patient presents with   Consult    Consult for SOB. Pt states she has ablation study done 08/22/2021 and woke up with SOB after that. Was alos notified she has nerve damage now too. No inhalers noted from patient Pt states that her SOB is only when she is up walking around and she is fine when she is sitting down. Pt states she is also having issues with her swallowing now and her diaphragm is having issues now. Albuterol made her sick     Referring provider: Carolee Rota, NP  HPI:   41 y.o. woman whom we are seeing in evaluation for dyspnea on exertion.  Most recent cardiology note reviewed.  ED note 08/2021 reviewed.  Patient reports having issues with SVT.  She had an ablation 07/2021.  The next day she had severe shortness of breath.  Walking short distances on flat surfaces.  Symptoms progressed over the next week or so and worsened prompting ED evaluation.  There D-dimer was negative.  Chest x-ray on my review interpretation reveals clear lungs bilaterally, no evidence of pulmonary edema, infiltrate, elevated diaphragm.  She was sent home.  She was given albuterol.  This did not help, made things worse.  Gradually time things have slightly gotten better, no severe is in July.  But overall significant dyspnea different than baseline prior to ablation.  Worse on inclines or stairs.  No time of day when things are better or worse.  No position to make things better or worse.  No seasonal or environmental factors she can identify to make things better or worse.  No other alleviating or exacerbating factors.  She does report getting heart rate up to 160s 180s with exertion.  No issues with heart rate not getting high enough.  Or  staying too low.  PMH: POTS, anxiety, SVT status post ablation 07/2021 Surgical history: Appendectomy, C-section, foot surgery, knee surgery, breast surgery Family  history: Reviewed, no respiratory illness in first-degree relatives Social history: Former smoker, quit 2017, Pharmacist, hospital / Pulmonary Flowsheets:   ACT:      No data to display          MMRC:     No data to display          Epworth:      No data to display          Tests:   FENO:  No results found for: "NITRICOXIDE"  PFT:     No data to display          WALK:      No data to display          Imaging: Personally reviewed and as per EMR and discussion in this note No results found.  Lab Results: Personally reviewed CBC    Component Value Date/Time   WBC 10.3 08/31/2021 1612   RBC 4.99 08/31/2021 1612   HGB 14.9 08/31/2021 1612   HGB 12.6 08/09/2021 1100   HGB 14.5 06/26/2013 1551   HCT 44.8 08/31/2021 1612   HCT 36.0 08/09/2021 1100   HCT 42.9 06/26/2013 1551   PLT 287 08/31/2021 1612   PLT 224 08/09/2021 1100   MCV 89.8 08/31/2021 1612   MCV 87 08/09/2021 1100   MCV 89.3 06/26/2013 1551   MCH 29.9 08/31/2021 1612   MCHC 33.3 08/31/2021 1612   RDW 12.8  08/31/2021 1612   RDW 12.1 08/09/2021 1100   RDW 13.1 06/26/2013 1551   LYMPHSABS 2.4 08/09/2021 1100   LYMPHSABS 2.0 06/26/2013 1551   MONOABS 0.4 10/09/2017 2343   MONOABS 0.5 06/26/2013 1551   EOSABS 0.2 08/09/2021 1100   BASOSABS 0.1 08/09/2021 1100   BASOSABS 0.0 06/26/2013 1551    BMET    Component Value Date/Time   NA 136 08/31/2021 1612   NA 145 (H) 08/09/2021 1100   NA 140 06/26/2013 1551   K 4.0 08/31/2021 1612   K 3.5 06/26/2013 1551   CL 103 08/31/2021 1612   CO2 20 (L) 08/31/2021 1612   CO2 21 (L) 06/26/2013 1551   GLUCOSE 86 08/31/2021 1612   GLUCOSE 116 06/26/2013 1551   BUN 11 08/31/2021 1612   BUN 12 08/09/2021 1100   BUN 17.6 06/26/2013 1551   CREATININE 0.94 08/31/2021 1612   CREATININE 0.8 06/26/2013 1551   CALCIUM 10.6 (H) 08/31/2021 1612   CALCIUM 10.6 (H) 06/26/2013 1551   GFRNONAA >60 08/31/2021 1612   GFRAA 107 05/21/2019  1640    BNP No results found for: "BNP"  ProBNP No results found for: "PROBNP"  Specialty Problems   None   Allergies  Allergen Reactions   Caffeine Palpitations   Ciprofloxacin Other (See Comments)    Other reaction(s): itching   Codeine Itching   Meperidine Hcl Other (See Comments)    Other reaction(s): hives   Ondansetron Other (See Comments)    Other reaction(s): itching - can take with Benadryl   Phenergan [Promethazine] Other (See Comments)    Other reaction(s): hives   Prednisone Other (See Comments)    Other reaction(s): extreme dizziness   Septra [Sulfamethoxazole-Trimethoprim] Other (See Comments)    Other reaction(s): blurred vision, itching   Phenergan [Promethazine Hcl] Itching and Rash    Immunization History  Administered Date(s) Administered   DTaP 03/15/1982, 05/03/1982, 07/04/1982, 07/05/1983, 01/05/1987   HIB (PRP-T) 12/28/1983   HIB, Unspecified 12/28/1983   Hepatitis B, PED/ADOLESCENT 01/03/1994, 02/02/1994, 07/05/1994   MMR 03/29/1983, 04/03/1987, 08/21/2014   OPV 03/15/1982, 05/03/1982, 07/05/1983, 01/05/1987   Td (Adult),5 Lf Tetanus Toxid, Preservative Free 09/03/1995, 10/16/2004   Tdap 03/18/2019    Past Medical History:  Diagnosis Date   Common migraine with intractable migraine 12/19/2017   Hypotension    Low blood pressure    Panic disorder 12/19/2017   "something that happens when I have a flare" regarding POTS   POTS (postural orthostatic tachycardia syndrome)    SVT (supraventricular tachycardia)    Tachycardia     Tobacco History: Social History   Tobacco Use  Smoking Status Former   Packs/day: 1.00   Years: 14.00   Total pack years: 14.00   Types: Cigarettes   Quit date: 2016   Years since quitting: 7.8  Smokeless Tobacco Never  Tobacco Comments   Pt states she vapes and it has nicotine.    Counseling given: Not Answered Tobacco comments: Pt states she vapes and it has nicotine.    Continue to not  smoke  Outpatient Encounter Medications as of 01/10/2022  Medication Sig   acetaminophen (TYLENOL 8 HOUR) 650 MG CR tablet 2 tablets as needed   ALPRAZolam (XANAX XR) 1 MG 24 hr tablet Take 1 mg by mouth every morning.   ALPRAZolam (XANAX) 0.5 MG tablet Take 0.5 mg by mouth daily at 6 PM.   atenolol (TENORMIN) 25 MG tablet Take 1 tablet (25 mg total) by mouth daily.   Botulinum  Toxin Type A (BOTOX) 200 units SOLR PROVIDER TO INJECT 155 UNITS INTO THE MUSCLES OF THE HEAD AND NECK EVERY 12 WEEKS. DISCARD REMAINDER.   buPROPion (WELLBUTRIN SR) 150 MG 12 hr tablet Take 150 mg by mouth every morning.   dicyclomine (BENTYL) 20 MG tablet Take 20 mg by mouth daily as needed for spasms.   diphenhydrAMINE (BENADRYL) 25 MG tablet Take 50 mg by mouth at bedtime.   ibuprofen (ADVIL) 800 MG tablet TAKE 1 TABLET(800 MG) BY MOUTH THREE TIMES DAILY AS NEEDED   ipratropium (ATROVENT) 0.06 % nasal spray 2 sprays in each nostril   ketorolac (TORADOL) 60 MG/2ML SOLN injection Inject 1-18m (30-656m intramuscularly at onset of migraine. May repeat in 6 hours. Max twice a day and 4 days per month.   meclizine (ANTIVERT) 25 MG tablet TAKE 1 TABLET(25 MG) BY MOUTH THREE TIMES DAILY AS NEEDED FOR DIZZINESS   Multiple Vitamin (MULTIVITAMIN PO) Take 2 tablets by mouth at bedtime.   ondansetron (ZOFRAN ODT) 8 MG disintegrating tablet Take 1 tablet (8 mg total) by mouth every 8 (eight) hours as needed for nausea or vomiting.   Potassium 99 MG TABS 1 tablet   promethazine (PHENERGAN) 50 MG tablet Take 1 tablet (50 mg total) by mouth every 6 (six) hours as needed for nausea or vomiting. Take with benadryl for itching reaction. Can take with nurtec or rizatriptan for migraine. May cause sedation.   Rimegepant Sulfate (NURTEC) 75 MG TBDP Take 75 mg by mouth daily as needed. (Patient taking differently: Take 75 mg by mouth daily as needed (Migraines).)   Rimegepant Sulfate (NURTEC) 75 MG TBDP Take 75 mg by mouth daily as needed.  For migraines. Take as close to onset of migraine as possible. One daily maximum.   rizatriptan (MAXALT-MLT) 10 MG disintegrating tablet Take 1 tablet (10 mg total) by mouth as needed for migraine. May repeat in 2 hours if needed   SLYND 4 MG TABS Take 4 mg by mouth daily.   SYRINGE-NEEDLE, DISP, 3 ML (B-D 3CC LUER-LOK SYR 25GX1") 25G X 1" 3 ML MISC Use with Toradol   venlafaxine XR (EFFEXOR-XR) 150 MG 24 hr capsule TAKE 1 CAPSULE(150 MG) BY MOUTH DAILY WITH BREAKFAST   zolpidem (AMBIEN) 10 MG tablet Take 10 mg by mouth at bedtime.   No facility-administered encounter medications on file as of 01/10/2022.     Review of Systems  Review of Systems  No chest pain with exertion.  No orthopnea or PND.  Comprehensive review of systems otherwise negative. Physical Exam  BP 124/68 (BP Location: Left Arm, Patient Position: Sitting, Cuff Size: Normal)   Pulse 89   Ht _0  (1.753 m)   Wt 197 lb 6.4 oz (89.5 kg)   SpO2 99%   BMI 29.15 kg/m   Wt Readings from Last 5 Encounters:  01/10/22 197 lb 6.4 oz (89.5 kg)  12/04/21 193 lb 3.2 oz (87.6 kg)  10/03/21 193 lb 3.2 oz (87.6 kg)  08/31/21 190 lb (86.2 kg)  08/22/21 190 lb (86.2 kg)    BMI Readings from Last 5 Encounters:  01/10/22 29.15 kg/m  12/04/21 28.53 kg/m  10/03/21 28.53 kg/m  08/31/21 28.06 kg/m  08/22/21 28.06 kg/m     Physical Exam General: Sitting in chair, no acute distress Eyes: EOMI, no icterus Neck: Supple, JVP Pulmonary: Clear, no work of breathing, air movement symmetric bilaterally Cardiovascular: Regular rate and rhythm, no murmur Abdomen: Nondistended, bowel sounds present MSK: No synovitis, no joint effusion  Neuro: Normal gait, no weakness Psych: Normal mood, full affect  Assessment & Plan:   Dyspnea on exertion: Near immediate onset after cardiac ablation June 2023.  Clear chest x-ray a week or 2 thereafter, no evidence of diaphragmatic injury or elevated diaphragm.  Given the immediate onset,  concern for pulmonary vein stenosis.  Discussed care with radiologist on-call.  Recommended cardiac or coronary CT.  Message sent to EP physician and cardiologist for assistance.  If preferred, I will order CT cardiac with pulmonary vein study.  She had a negative D-dimer during ED visit at time of chest x-ray.  We will repeat this today and if elevated assess for pulmonary embolus a CTA PE protocol.  No improvement with albuterol.  Low suspicion for asthma but can offer inhaler therapy in the future as well as pulmonary function test if initial investigation is negative.  History of tobacco abuse in remission: Discussed current recommendations for lung cancer screening, she does not meet based on age or pack-year.  Return in about 2 months (around 03/12/2022).   Lanier Clam, MD 01/10/2022   This appointment required 61 minutes of patient care (this includes precharting, chart review, review of results, face-to-face care, etc.).

## 2022-01-10 NOTE — Patient Instructions (Addendum)
Nice to meet you  I am worried about pulmonary vein stenosis - this can occur after an ablation.  Blood work and a chest x-ray today  If there are any abnormalities I will let you know and we will plan for next steps  I will order the correct scan to look for pulmonary vein stenosis after I discuss with radiology  Return to clinic in 2 months or sooner as needed with Dr. Judeth Horn, this is more placeholder than anything else we will discuss results as they come in

## 2022-01-11 NOTE — Progress Notes (Signed)
D dimer is negative - no need to worry about pulmonary embolus of blood clots in lungs

## 2022-01-15 NOTE — Progress Notes (Signed)
CXR is clear

## 2022-01-22 ENCOUNTER — Telehealth: Payer: Self-pay | Admitting: Pulmonary Disease

## 2022-01-22 DIAGNOSIS — Z8679 Personal history of other diseases of the circulatory system: Secondary | ICD-10-CM

## 2022-01-22 DIAGNOSIS — R0609 Other forms of dyspnea: Secondary | ICD-10-CM

## 2022-01-22 NOTE — Telephone Encounter (Signed)
Called and left voicemail for patient to call office back to talk about recommendations from Dr Ladona Ridgel and Dr Judeth Horn.

## 2022-01-22 NOTE — Telephone Encounter (Signed)
Case discussed with Dr. Ladona Ridgel, EP physician.  Procedure limited to the RA, pulmonary veins not manipulated.  He recommended 7-day Zio patch as well as a repeat echocardiogram.  Both ordered.  We will also order pulmonary function test to round out for evaluation.  April, can you let her know that no further imaging is needed for now.  I am ordering pulmonary function test, and the studies above suggested by Dr. Ladona Ridgel.  He would like to see her in clinic for follow-up after the Zio patch/heart monitor as well as echocardiogram are performed.

## 2022-01-22 NOTE — Telephone Encounter (Signed)
Printed off office note from 01/10/2022 from dr [facility] visit. Faxed to number provided by Annabelle Harman. Nothing further needed

## 2022-01-23 NOTE — Telephone Encounter (Signed)
Attempted to call pt but line went directly to VM. Left message for her to return call. °

## 2022-01-24 ENCOUNTER — Other Ambulatory Visit: Payer: Self-pay | Admitting: Neurology

## 2022-01-26 NOTE — Telephone Encounter (Signed)
Pt called the office and I let her know the info per Dr. Judeth Horn after speaking with Dr. Ladona Ridgel. Pt verbalized understanding. PFT has been scheduled.

## 2022-01-30 ENCOUNTER — Other Ambulatory Visit: Payer: Medicaid Other

## 2022-01-31 ENCOUNTER — Institutional Professional Consult (permissible substitution): Payer: Medicaid Other | Admitting: Neurology

## 2022-02-01 ENCOUNTER — Encounter (HOSPITAL_COMMUNITY): Payer: Self-pay | Admitting: Pulmonary Disease

## 2022-02-02 ENCOUNTER — Encounter (HOSPITAL_COMMUNITY): Payer: Self-pay

## 2022-02-06 ENCOUNTER — Other Ambulatory Visit: Payer: Self-pay | Admitting: Neurology

## 2022-02-08 ENCOUNTER — Other Ambulatory Visit: Payer: Self-pay | Admitting: *Deleted

## 2022-02-08 DIAGNOSIS — G43009 Migraine without aura, not intractable, without status migrainosus: Secondary | ICD-10-CM

## 2022-02-08 MED ORDER — RIZATRIPTAN BENZOATE 10 MG PO TBDP
10.0000 mg | ORAL_TABLET | ORAL | 11 refills | Status: DC | PRN
Start: 2022-02-08 — End: 2022-04-30

## 2022-02-08 MED ORDER — NURTEC 75 MG PO TBDP: 75.0000 mg | ORAL_TABLET | Freq: Every day | ORAL | 11 refills | Status: DC | PRN

## 2022-02-12 ENCOUNTER — Other Ambulatory Visit: Payer: Self-pay

## 2022-02-12 ENCOUNTER — Other Ambulatory Visit (HOSPITAL_COMMUNITY): Payer: Self-pay

## 2022-02-12 ENCOUNTER — Telehealth: Payer: Self-pay | Admitting: Neurology

## 2022-02-12 NOTE — Telephone Encounter (Signed)
I called [facility] spoke to pharmacist.  I relayed that pt can take rizatriptan max 2 tabs per day if needed 2 hours apart for acute onset for migraines.  She can take nurtec as well if needed (one daily).  He made the notation.  Appreciated call back.

## 2022-02-12 NOTE — Telephone Encounter (Signed)
Marcelino Duster is calling from [facility]. Stated she need clarify prescription   rizatriptan (MAXALT-MLT) 10 MG disintegrating tablet stated how many can be taking for a 24 hour period. Marcelino Duster wants to know if pt will be taking Nurtec and the Rizatriptan at the same time. Please call [phone] or send back a electronic clarification or fax at [phone].

## 2022-02-13 ENCOUNTER — Other Ambulatory Visit: Payer: Self-pay

## 2022-02-14 ENCOUNTER — Encounter (HOSPITAL_COMMUNITY): Payer: Self-pay

## 2022-02-14 ENCOUNTER — Encounter: Payer: Self-pay | Admitting: Pulmonary Disease

## 2022-02-14 ENCOUNTER — Other Ambulatory Visit (HOSPITAL_COMMUNITY): Payer: Self-pay

## 2022-02-16 ENCOUNTER — Other Ambulatory Visit: Payer: Self-pay

## 2022-02-23 ENCOUNTER — Encounter: Payer: Self-pay | Admitting: Internal Medicine

## 2022-02-26 NOTE — Telephone Encounter (Signed)
Lets order an echo  I do not understand to the source of the concern of pulmonary vein stenosis

## 2022-02-27 ENCOUNTER — Ambulatory Visit (INDEPENDENT_AMBULATORY_CARE_PROVIDER_SITE_OTHER): Payer: Medicaid Other | Admitting: Pulmonary Disease

## 2022-02-27 DIAGNOSIS — Z8679 Personal history of other diseases of the circulatory system: Secondary | ICD-10-CM | POA: Diagnosis not present

## 2022-02-27 DIAGNOSIS — Z9889 Other specified postprocedural states: Secondary | ICD-10-CM | POA: Diagnosis not present

## 2022-02-27 LAB — PULMONARY FUNCTION TEST
DL/VA % pred: 91 %
DL/VA: 3.9 ml/min/mmHg/L
DLCO cor % pred: 83 %
DLCO cor: 21.62 ml/min/mmHg
DLCO unc % pred: 83 %
DLCO unc: 21.62 ml/min/mmHg
FEF 25-75 Post: 3.03 L/sec
FEF 25-75 Pre: 2.6 L/sec
FEF2575-%Change-Post: 16 %
FEF2575-%Pred-Post: 88 %
FEF2575-%Pred-Pre: 75 %
FEV1-%Change-Post: 2 %
FEV1-%Pred-Post: 92 %
FEV1-%Pred-Pre: 90 %
FEV1-Post: 3.25 L
FEV1-Pre: 3.17 L
FEV1FVC-%Change-Post: 9 %
FEV1FVC-%Pred-Pre: 91 %
FEV6-%Change-Post: -6 %
FEV6-%Pred-Post: 92 %
FEV6-%Pred-Pre: 98 %
FEV6-Post: 3.93 L
FEV6-Pre: 4.19 L
FEV6FVC-%Change-Post: 0 %
FEV6FVC-%Pred-Post: 101 %
FEV6FVC-%Pred-Pre: 101 %
FVC-%Change-Post: -6 %
FVC-%Pred-Post: 90 %
FVC-%Pred-Pre: 96 %
FVC-Post: 3.93 L
FVC-Pre: 4.2 L
Post FEV1/FVC ratio: 83 %
Post FEV6/FVC ratio: 100 %
Pre FEV1/FVC ratio: 76 %
Pre FEV6/FVC Ratio: 100 %
RV % pred: 123 %
RV: 2.27 L
TLC % pred: 112 %
TLC: 6.52 L

## 2022-02-27 NOTE — Progress Notes (Signed)
PFT largely normal but does show mild air trapping that can occur with something like asthma. Can try inhaler to see if helps with shortness of breath if she is amenable.

## 2022-02-27 NOTE — Patient Instructions (Signed)
Full PFT Performed Today  

## 2022-02-27 NOTE — Progress Notes (Signed)
Full PFT Performed Today  

## 2022-02-28 ENCOUNTER — Telehealth: Payer: Self-pay | Admitting: Pulmonary Disease

## 2022-02-28 ENCOUNTER — Ambulatory Visit: Payer: Medicaid Other | Admitting: Neurology

## 2022-02-28 NOTE — Telephone Encounter (Signed)
Called patient and went over PFT results with her and she verbalized understanding. Nothing further needed

## 2022-03-01 ENCOUNTER — Ambulatory Visit
Admission: RE | Admit: 2022-03-01 | Discharge: 2022-03-01 | Disposition: A | Discharge status: Home / Self Care | Payer: Medicaid Other | Source: Ambulatory Visit | Attending: Neurology | Admitting: Neurology

## 2022-03-01 DIAGNOSIS — Z09 Encounter for follow-up examination after completed treatment for conditions other than malignant neoplasm: Secondary | ICD-10-CM

## 2022-03-01 DIAGNOSIS — G9001 Carotid sinus syncope: Secondary | ICD-10-CM

## 2022-03-01 MED ORDER — IOPAMIDOL (ISOVUE-370) INJECTION 76%
65.0000 mL | Freq: Once | INTRAVENOUS | Status: AC | PRN
  Administered 2022-03-01: 65 mL via INTRAVENOUS

## 2022-03-02 ENCOUNTER — Encounter: Payer: Self-pay | Admitting: Neurology

## 2022-03-02 ENCOUNTER — Other Ambulatory Visit: Payer: Self-pay | Admitting: Neurology

## 2022-03-05 ENCOUNTER — Ambulatory Visit: Payer: Medicaid Other | Admitting: Neurology

## 2022-03-05 DIAGNOSIS — G43709 Chronic migraine without aura, not intractable, without status migrainosus: Secondary | ICD-10-CM

## 2022-03-05 MED ORDER — ONABOTULINUMTOXINA 200 UNITS IJ SOLR
155.0000 [IU] | Freq: Once | INTRAMUSCULAR | Status: AC
  Administered 2022-03-05: 155 [IU] via INTRAMUSCULAR

## 2022-03-05 NOTE — Progress Notes (Addendum)
03/05/2022: stable doing great  11/30/2021: Prior to botox daily headaches and most of the month migraines maybe even daily. Botox has changed her life. She has a lot of light sensitivity. But even the light sensitivity is better, she wears FL-41. After botox has 6 evere migraine days a month and < 14 total headache days a month significant >>50% improvement on botox in frequ and severity. Failed ubrelvy, imitrex, rizatriptan, zomig. Can try ubrelvy if nurtec not approved. Affects her ability to drive, light sensitivity affects her ability to go outside, filled out a car tinting form, still has 6 -7 severe migraine days a month and headaches, has POTS with fatigue and exercise instability.  She cannot work 8 hours straight without restm migraines exacerbated by fatigue, lights, exertion, significantly impacting her life despite improvement by botox. Higher incidence of POTS with migraines.   09/07/2021: Botox is working well.  06/14/2021: Reports botox working well. Two weeks late for botox so she has had more migraines. 1/ week migraine when taking botox consistently. Never severe.    BOTOX PROCEDURE NOTE FOR MIGRAINE HEADACHE    Contraindications and precautions discussed with patient(above). Aseptic procedure was observed and patient tolerated procedure. Procedure performed by Ward Givens, NP  The condition has existed for more than 6 months, and pt does not have a diagnosis of ALS, Myasthenia Gravis or Lambert-Eaton Syndrome.  Risks and benefits of injections discussed and pt agrees to proceed with the procedure.  Written consent obtained  These injections are medically necessary. These injections do not cause sedations or hallucinations which the oral therapies may cause.  Indication/Diagnosis: chronic migraine  Description of procedure:  The patient was placed in a sitting position. The standard protocol was used for Botox as follows, with 5 units of Botox injected at each  site:   -Procerus muscle, midline injection  -Corrugator muscle, bilateral injection  -Frontalis muscle, bilateral injection, with 2 sites each side, medial injection was performed in the upper one third of the frontalis muscle, in the region vertical from the medial inferior edge of the superior orbital rim. The lateral injection was again in the upper one third of the forehead vertically above the lateral limbus of the cornea, 1.5 cm lateral to the medial injection site.  -Temporalis muscle injection, 4 sites, bilaterally. The first injection was 3 cm above the tragus of the ear, second injection site was 1.5 cm to 3 cm up from the first injection site in line with the tragus of the ear. The third injection site was 1.5-3 cm forward between the first 2 injection sites. The fourth injection site was 1.5 cm posterior to the second injection site.  -Occipitalis muscle injection, 3 sites, bilaterally. The first injection was done one half way between the occipital protuberance and the tip of the mastoid process behind the ear. The second injection site was done lateral and superior to the first, 1 fingerbreadth from the first injection. The third injection site was 1 fingerbreadth superiorly and medially from the first injection site.  -Cervical paraspinal muscle injection, 2 sites, bilateral knee first injection site was 1 cm from the midline of the cervical spine, 3 cm inferior to the lower border of the occipital protuberance. The second injection site was 1.5 cm superiorly and laterally to the first injection site.  -Trapezius muscle injection was performed at 3 sites, bilaterally. The first injection site was in the upper trapezius muscle halfway between the inflection point of the neck, and the acromion. The second  injection site was one half way between the acromion and the first injection site. The third injection was done between the first injection site and the inflection point of the  neck.   Will return for repeat injection in 3 months.   A 200 units of Botox was used, 155 units were injected, the rest of the Botox was wasted. The patient tolerated the procedure well, there were no complications of the above procedure.  Sarina Ill, MD [facility] [address] [place], [place] [address] [phone]

## 2022-03-05 NOTE — Progress Notes (Unsigned)
Botox- 200 units x 1 vial Lot: [id number]  Expiration: 06/2024 NDC: 0023-3921-02  Bacteriostatic 0.9% Sodium Chloride- 4mL total Lot: [id number] Expiration: 10/28/2022 NDC: 0409-1966-02  Dx: G43.709 S/P   

## 2022-03-06 ENCOUNTER — Institutional Professional Consult (permissible substitution): Payer: Medicaid Other | Admitting: Neurology

## 2022-03-07 ENCOUNTER — Telehealth: Payer: Self-pay | Admitting: Neurology

## 2022-03-07 NOTE — Telephone Encounter (Signed)
Pt called stating that she had her BOTOX done yesterday and that night right before she went to sleep her body started jerking and would like to discuss with RN.

## 2022-03-08 ENCOUNTER — Ambulatory Visit (HOSPITAL_COMMUNITY): Payer: Medicaid Other | Attending: Pulmonary Disease

## 2022-03-08 DIAGNOSIS — R0609 Other forms of dyspnea: Secondary | ICD-10-CM | POA: Diagnosis present

## 2022-03-08 DIAGNOSIS — Z8679 Personal history of other diseases of the circulatory system: Secondary | ICD-10-CM | POA: Insufficient documentation

## 2022-03-08 DIAGNOSIS — Z9889 Other specified postprocedural states: Secondary | ICD-10-CM | POA: Insufficient documentation

## 2022-03-08 LAB — ECHOCARDIOGRAM COMPLETE
Area-P 1/2: 4.1 cm2
S' Lateral: 2.4 cm

## 2022-03-08 NOTE — Telephone Encounter (Signed)
I called pt.  She had botox 03-05-2022.  Later she said that had migraine level 7, was desperate took, 1 rizatriptan, 2 tylenol/ 1 ibuprofen over 2 hours along with emgality inj.  Then 1 toradol injection.  After 3 hours was not working took phenergan ( and 2 benadryl 50mg  total).  This helped, no headache.  She then took her regular meds at bedtime ( which was 3 hours later betablocker, another 2 benadryl and ambien).  She had then R leg jerking (like from bone out) could not sit comfortable/numb like.  Then went to L leg, R arm then full body jerking.  Did not know when she fell asleep. Woke up then next morning with headache and then today as well.  She has not taken benadryl again.  Felt like she took too much medication. Acupunctuist said jerks like you get when in sleep stage.  She wanted to let us know and if anything you recommend.

## 2022-03-08 NOTE — Telephone Encounter (Signed)
I called pt LMVM for her that returned call.

## 2022-03-08 NOTE — Telephone Encounter (Signed)
I think she should watch out for polypharmacy especially so much benadryl with ambien thanks

## 2022-03-11 NOTE — Progress Notes (Signed)
We can Try Symbicort 160 dose 2 puff BID - rinse mouth with water after every use

## 2022-03-11 NOTE — Progress Notes (Signed)
Echocardiogram appears normal which is great news

## 2022-03-12 ENCOUNTER — Telehealth: Payer: Self-pay | Admitting: Pulmonary Disease

## 2022-03-12 NOTE — Telephone Encounter (Signed)
Called patient back and she is wondering for the trapped air that was noted on PFT is there any Breathing exercises she can do that can help?  Will Symbicort make her heart race given she has POTs syndrome?  And will the Symbicort help the SOB that she currently has?  Please advise sir

## 2022-03-14 ENCOUNTER — Other Ambulatory Visit: Payer: Self-pay | Admitting: Pulmonary Disease

## 2022-03-14 ENCOUNTER — Ambulatory Visit: Payer: Medicaid Other | Attending: Pulmonary Disease

## 2022-03-14 DIAGNOSIS — R0609 Other forms of dyspnea: Secondary | ICD-10-CM

## 2022-03-14 DIAGNOSIS — R42 Dizziness and giddiness: Secondary | ICD-10-CM

## 2022-03-14 DIAGNOSIS — I471 Supraventricular tachycardia, unspecified: Secondary | ICD-10-CM

## 2022-03-14 DIAGNOSIS — Z8679 Personal history of other diseases of the circulatory system: Secondary | ICD-10-CM

## 2022-03-14 MED ORDER — BUDESONIDE-FORMOTEROL FUMARATE 160-4.5 MCG/ACT IN AERO: 2.0000 | INHALATION_SPRAY | Freq: Two times a day (BID) | RESPIRATORY_TRACT | 4 refills | Status: DC

## 2022-03-14 NOTE — Telephone Encounter (Signed)
The hope is the symbicort will help - It should not make her heart race but if she feels it does please let us know

## 2022-03-14 NOTE — Progress Notes (Unsigned)
Enrolled for [employer] to mail a ZIO XT long term holter monitor to the patients address on file.  °Dr. Taylor to read. °

## 2022-03-14 NOTE — Telephone Encounter (Signed)
Called and spoke to patient about Symbicort and she was fine with it. Sent in Symbicort to verified pharmacy. Nothing further needed

## 2022-03-17 DIAGNOSIS — Z8679 Personal history of other diseases of the circulatory system: Secondary | ICD-10-CM

## 2022-03-17 DIAGNOSIS — R0609 Other forms of dyspnea: Secondary | ICD-10-CM

## 2022-03-17 DIAGNOSIS — Z9889 Other specified postprocedural states: Secondary | ICD-10-CM

## 2022-03-17 DIAGNOSIS — R42 Dizziness and giddiness: Secondary | ICD-10-CM | POA: Diagnosis not present

## 2022-03-17 DIAGNOSIS — I471 Supraventricular tachycardia, unspecified: Secondary | ICD-10-CM | POA: Diagnosis not present

## 2022-03-23 ENCOUNTER — Telehealth: Payer: Self-pay | Admitting: Internal Medicine

## 2022-03-23 NOTE — Telephone Encounter (Signed)
Pt c/o swelling: STAT is pt has developed SOB within 24 hours  If swelling, where is the swelling located? Feet   How much weight have you gained and in what time span? Not sure   Have you gained 3 pounds in a day or 5 pounds in a week? Not sure  Do you have a log of your daily weights (if so, list)? no  Are you currently taking a fluid pill? No, does not think so  Are you currently SOB? no  Have you traveled recently? No    Patient's husband states the patient has swelling in her feet as well as numbness. He says she is also having an increase in heat intolerance. He states she fell asleep and her symptoms are difficult to describe and this she is having neuropathy, but is not sure if that is the right word. Phone: [phone]

## 2022-03-23 NOTE — Telephone Encounter (Signed)
Spoke with pt's husband,DPR who reports pt is currently sleeping.  He would like to schedule an appointment for pt to be seen by Dr Caryl Comes as she has developed new symptoms of concern that may be related to her dx of dysautonomia.  Pt's husband reports some new swelling of pt's feet as well as "neuropathy pain"  He reports swelling is mild.  No new SOB.  He also reports pt describes a new type of heat intolerance and "sweating."  Encouraged pt's husband to have pt to contact PCP office to schedule an appointment to also discuss symptoms.  Pt is seeing Pulmonary and had a recent normal echo.  Pt is currently wearing a heart monitor and will complete her wear time tomorrow.  Pt is staying hydrated and drinking Propel for salt repletion.  She is elevating feet and legs while sitting.  No current vital signs available. Advised will forward to Dr Olin Pia scheduler to set up appointment for further evaluation by Dr Caryl Comes.  Pt's husband verbalizes understanding and thanked Therapist, sports for the call.

## 2022-04-02 ENCOUNTER — Encounter: Payer: Self-pay | Admitting: Pulmonary Disease

## 2022-04-02 ENCOUNTER — Telehealth (INDEPENDENT_AMBULATORY_CARE_PROVIDER_SITE_OTHER): Payer: Medicaid Other | Admitting: Pulmonary Disease

## 2022-04-02 DIAGNOSIS — R0609 Other forms of dyspnea: Secondary | ICD-10-CM | POA: Diagnosis not present

## 2022-04-02 MED ORDER — SPIRIVA RESPIMAT 1.25 MCG/ACT IN AERS: 2.0000 | INHALATION_SPRAY | Freq: Every day | RESPIRATORY_TRACT | 3 refills | Status: DC

## 2022-04-02 NOTE — Patient Instructions (Addendum)
Continue Symbicort as you are  Start Spiriva 2 puffs once a day  Go to you youtube and search "[employer] how to use a respimat" for instructions on using Spiriva  Send me a message in a few weeks and let me know if it is helping or not  Retrun to clinic in 3 months or sooner as needed with Dr. Silas Flood

## 2022-04-04 ENCOUNTER — Ambulatory Visit (INDEPENDENT_AMBULATORY_CARE_PROVIDER_SITE_OTHER): Payer: Medicaid Other | Admitting: Neurology

## 2022-04-04 ENCOUNTER — Encounter (INDEPENDENT_AMBULATORY_CARE_PROVIDER_SITE_OTHER): Payer: Self-pay

## 2022-04-04 ENCOUNTER — Encounter: Payer: Self-pay | Admitting: Neurology

## 2022-04-04 VITALS — BP 102/70 | HR 84 | Ht 69.0 in | Wt 195.0 lb

## 2022-04-04 DIAGNOSIS — G4719 Other hypersomnia: Secondary | ICD-10-CM

## 2022-04-04 DIAGNOSIS — R519 Headache, unspecified: Secondary | ICD-10-CM

## 2022-04-04 DIAGNOSIS — G901 Familial dysautonomia [Riley-Day]: Secondary | ICD-10-CM | POA: Diagnosis not present

## 2022-04-04 DIAGNOSIS — G90A Postural orthostatic tachycardia syndrome (POTS): Secondary | ICD-10-CM | POA: Diagnosis not present

## 2022-04-04 DIAGNOSIS — R351 Nocturia: Secondary | ICD-10-CM

## 2022-04-04 DIAGNOSIS — I471 Supraventricular tachycardia, unspecified: Secondary | ICD-10-CM

## 2022-04-04 DIAGNOSIS — G4700 Insomnia, unspecified: Secondary | ICD-10-CM | POA: Diagnosis not present

## 2022-04-04 DIAGNOSIS — E663 Overweight: Secondary | ICD-10-CM

## 2022-04-04 NOTE — Patient Instructions (Signed)
It was nice to meet you today.  As discussed, we will proceed with a nighttime sleep study to see if there is an obvious cause for your sleep disruption and difficulty sleeping at night.  If you have sleep apnea, I may recommend a machine called CPAP or AutoPap for you to try.

## 2022-04-04 NOTE — Progress Notes (Signed)
Subjective:    Patient ID: Mia Rubio is a 42 y.o. female.  HPI    Mia Foley, MD, PhD [facility] [address] [address] [place], [place] [address]  Dear Mia Rubio,   I saw your patient, Mia Rubio, upon your kind request in my sleep clinic today for initial consultation of her sleep disorder, in particular, concern for underlying obstructive sleep apnea.  The patient is accompanied by her husband and son today.  As you know, Mia Rubio is a 42 year old female with an underlying medical history of chronic migraines, POTS, history of hypotension, SVT with status post ablation in June 2023, dysautonomia, and overweight state, who reports difficulty sleeping at night and and excessive daytime somnolence as well as recurrent headaches, chronic daytime fatigue.  Her Epworth sleepiness score is 11 out of 24, fatigue severity score is 63 out of 63. I reviewed your video visit note from 12/19/2021.  She has been on Ambien for years, this dates back to when she was working a very variable schedule as a Nurse, adult for [employer].  She is currently not working.  She lives with her family which includes her husband and her son.  She has been taking Ambien around 2 AM and recently added CBD Gummies around 3 AM, typically may be asleep before 5 AM, she tries to be in bed around 2 AM.  Rise time varies, depending on what she has to do for the day.  Generally between noon and 2 PM.  She has nocturia about once per average night, denies waking up with a headache but has recurrent headaches, migraines have actually improved since she has been on Botox injections for the past year or so.  She takes a beta-blocker in the evening.  She is also on medication for anxiety and depression continue alprazolam, Wellbutrin, and Effexor.  She has no family history of sleep apnea, she snores intermittently per husband's report.  She does not drink caffeine daily.  Her Past Medical  History Is Significant For: Past Medical History:  Diagnosis Date   Common migraine with intractable migraine 12/19/2017   Hypotension    Low blood pressure    Panic disorder 12/19/2017   "something that happens when I have a flare" regarding POTS   POTS (postural orthostatic tachycardia syndrome)    SVT (supraventricular tachycardia)    Tachycardia     Her Past Surgical History Is Significant For: Past Surgical History:  Procedure Laterality Date   APPENDECTOMY     BREAST SURGERY     CESAREAN SECTION N/A 08/19/2014   Procedure: CESAREAN SECTION;  Surgeon: Mia Hidalgo, DO;  Location: [facility];  Service: Obstetrics;  Laterality: N/A;   DILATION AND EVACUATION N/A 11/24/2016   Procedure: DILATATION AND EVACUATION;  Surgeon: Mia Hun, MD;  Location: [facility];  Service: Gynecology;  Laterality: N/A;   FOOT SURGERY  2011 2012   KNEE SURGERY  2001  2002   plantar fasciitis     SVT ABLATION N/A 08/22/2021   Procedure: SVT ABLATION;  Surgeon: Mia Maw, MD;  Location: [facility];  Service: Cardiovascular;  Laterality: N/A;    Her Family History Is Significant For: Family History  Problem Relation Age of Onset   Heart murmur Father    Cancer Maternal Grandmother        leg   Diabetes Maternal Grandmother    Headache Maternal Great-grandmother        "sick headache", probably migraine  Sleep apnea Neg Hx    Sleep disorder Neg Hx     Her Social History Is Significant For: Social History   Socioeconomic History   Marital status: Married    Spouse name: Not on file   Number of children: Not on file   Years of education: Not on file   Highest education level: Not on file  Occupational History   Not on file  Tobacco Use   Smoking status: Former    Packs/day: 1.00    Years: 14.00    Total pack years: 14.00    Types: Cigarettes    Quit date: 2016    Years since quitting: 8.1   Smokeless tobacco: Never   Tobacco comments:    Pt states she vapes and it  has nicotine.   Vaping Use   Vaping Use: Some days   Substances: Nicotine  Substance and Sexual Activity   Alcohol use: Not Currently   Drug use: No   Sexual activity: Not on file  Other Topics Concern   Not on file  Social History Narrative   Lives at home with husband and son    Right handed   Caffeine: none    Social Determinants of Health   Financial Resource Strain: Not on file  Food Insecurity: Not on file  Transportation Needs: Not on file  Physical Activity: Not on file  Stress: Not on file  Social Connections: Not on file    Her Allergies Are:  Allergies  Allergen Reactions   Caffeine Palpitations   Ciprofloxacin Other (See Comments)    Other reaction(s): itching   Codeine Itching   Meperidine Hcl Other (See Comments)    Other reaction(s): hives   Ondansetron Other (See Comments)    Other reaction(s): itching - can take with Benadryl   Phenergan [Promethazine] Other (See Comments)    Other reaction(s): hives   Prednisone Other (See Comments)    Other reaction(s): extreme dizziness   Septra [Sulfamethoxazole-Trimethoprim] Other (See Comments)    Other reaction(s): blurred vision, itching   Phenergan [Promethazine Hcl] Itching and Rash  :   Her Current Medications Are:  Outpatient Encounter Medications as of 04/04/2022  Medication Sig   acetaminophen (TYLENOL 8 HOUR) 650 MG CR tablet 2 tablets as needed   ALPRAZolam (XANAX XR) 1 MG 24 hr tablet Take 1 mg by mouth every morning.   ALPRAZolam (XANAX) 0.5 MG tablet Take 0.5 mg by mouth daily at 6 PM.   atenolol (TENORMIN) 25 MG tablet Take 1 tablet (25 mg total) by mouth daily.   botulinum toxin Type A (BOTOX) 200 units injection INJECT 155 UNITS INTRAMUSCULARLY TO HEAD AND NECK EVERY 12 WEEKS  (DISCARD UNUSED AFTER FIRST USE)   budesonide-formoterol (SYMBICORT) 160-4.5 MCG/ACT inhaler Inhale 2 puffs into the lungs 2 (two) times daily.   buPROPion (WELLBUTRIN SR) 150 MG 12 hr tablet Take 150 mg by mouth every  morning.   dicyclomine (BENTYL) 20 MG tablet Take 20 mg by mouth daily as needed for spasms.   diphenhydrAMINE (BENADRYL) 25 MG tablet Take 50 mg by mouth at bedtime.   ibuprofen (ADVIL) 800 MG tablet TAKE 1 TABLET(800 MG) BY MOUTH THREE TIMES DAILY AS NEEDED   ipratropium (ATROVENT) 0.06 % nasal spray 2 sprays in each nostril   ketorolac (TORADOL) 60 MG/2ML SOLN injection Inject 1-12ml (30-60mg ) intramuscularly at onset of migraine. May repeat in 6 hours. Max twice a day and 4 days per month.   meclizine (ANTIVERT) 25 MG tablet  TAKE 1 TABLET(25 MG) BY MOUTH THREE TIMES DAILY AS NEEDED FOR DIZZINESS   Multiple Vitamin (MULTIVITAMIN PO) Take 2 tablets by mouth at bedtime.   ondansetron (ZOFRAN-ODT) 8 MG disintegrating tablet DISSOLVE 1 TABLET(8 MG) UNDER THE TONGUE EVERY 8 HOURS AS NEEDED FOR NAUSEA OR VOMITING   Potassium 99 MG TABS 1 tablet   promethazine (PHENERGAN) 50 MG tablet Take 1 tablet (50 mg total) by mouth every 6 (six) hours as needed for nausea or vomiting. Take with benadryl for itching reaction. Can take with nurtec or rizatriptan for migraine. May cause sedation.   Rimegepant Sulfate (NURTEC) 75 MG TBDP Take 75 mg by mouth daily as needed. For migraines. Take as close to onset of migraine as possible. One daily maximum.   Rimegepant Sulfate (NURTEC) 75 MG TBDP Take 75 mg by mouth daily as needed (Migraines).   rizatriptan (MAXALT-MLT) 10 MG disintegrating tablet Take 1 tablet (10 mg total) by mouth as needed for migraine. May repeat in 2 hours if needed   SLYND 4 MG TABS Take 4 mg by mouth daily.   SYRINGE-NEEDLE, DISP, 3 ML (B-D 3CC LUER-LOK SYR 25GX1") 25G X 1" 3 ML MISC Use with Toradol   Tiotropium Bromide Monohydrate (SPIRIVA RESPIMAT) 1.25 MCG/ACT AERS Inhale 2 puffs into the lungs daily.   venlafaxine XR (EFFEXOR-XR) 150 MG 24 hr capsule TAKE 1 CAPSULE(150 MG) BY MOUTH DAILY WITH BREAKFAST   zolpidem (AMBIEN) 10 MG tablet Take 10 mg by mouth at bedtime.   No  facility-administered encounter medications on file as of 04/04/2022.  :   Review of Systems:  Out of a complete 14 point review of systems, all are reviewed and negative with the exception of these symptoms as listed below:  Review of Systems  Neurological:        Patient is here with husband and son for sleep consult. Patient does not typically snore. She has trouble falling asleep and staying asleep. She is sleepy during the day. She gets easily tired with activities. Her symptoms are way worse after her heart procedure for ablation of her nerves. She has dysautonomia. She has been having issues with breathing and she sees a pulmonologist. She uses CBD gummies to try to help her sleep. She takes Ambien 10 mg every night. She takes Xanax 1 mg in the morning to help with her antidepressant and 0.5 mg as an emergency when needed (not daily). ESS 11 FSS 63    Objective:  Neurological Exam  Physical Exam Physical Examination:   Vitals:   04/04/22 1522  BP: 102/70  Pulse: 84    General Examination: The patient is a very pleasant 42 y.o. female in no acute distress. She appears well-developed and well-nourished and well groomed.   HEENT: Normocephalic, atraumatic, pupils are equal, round and reactive to light, extraocular tracking is good without limitation to gaze excursion or nystagmus noted.  Corrective eyeglasses in place.  Hearing is grossly intact. Face is symmetric with normal facial animation. Speech is clear with no dysarthria noted. There is no hypophonia. There is no lip, neck/head, jaw or voice tremor. Neck is supple with full range of passive and active motion. There are no carotid bruits on auscultation. Oropharynx exam reveals: mild mouth dryness, good dental hygiene and mild airway crowding, due to small airway entry.  Mallampati class II, tonsils about 1+.  Tongue protrudes centrally and palate elevates symmetrically.  Chest: Clear to auscultation without wheezing, rhonchi or  crackles noted.  Heart: S1+S2+0, regular  and normal without murmurs, rubs or gallops noted.   Abdomen: Soft, non-tender and non-distended.  Extremities: There is no obvious edema in the distal lower extremities bilaterally.   Skin: Warm and dry without trophic changes noted.   Musculoskeletal: exam reveals no obvious joint deformities.   Neurologically:  Mental status: The patient is awake, alert and oriented in all 4 spheres. Her immediate and remote memory, attention, language skills and fund of knowledge are appropriate. There is no evidence of aphasia, agnosia, apraxia or anomia. Speech is clear with normal prosody and enunciation. Thought process is linear. Mood is normal and affect is normal.  Cranial nerves II - XII are as described above under HEENT exam.  Motor exam: Normal bulk, strength and tone is noted. There is no obvious action or resting tremor.  Fine motor skills and coordination: grossly intact.  Cerebellar testing: No dysmetria or intention tremor. There is no truncal or gait ataxia.  Sensory exam: intact to light touch in the upper and lower extremities.  Gait, station and balance: She stands easily. No veering to one side is noted. No leaning to one side is noted. Posture is age-appropriate and stance is narrow based. Gait shows normal stride length and normal pace. No problems turning are noted.   Assessment and plan:   In summary, Mia Rubio is a very pleasant 42 y.o.-year old female with an underlying medical history of chronic migraines, POTS, history of hypotension, SVT with status post ablation in June 2023, dysautonomia, and overweight state, who presents for evaluation of her sleep disturbance, she has chronic difficulty initiating and maintaining sleep, daytime somnolence and daytime tiredness.  She does not have a telltale history concerning for underlying sleep disordered breathing by dysautonomia may be causing her to sleep poorly.  She has been on Ambien  which is higher dose, she is advised that the recommended dose for female patients is 5 mg, she has been on it for years.  She has a prescription for this through her primary care.  She has tried over-the-counter medications including melatonin and recently added CBD Gummies.  We talked about the importance of keeping a sleep schedule, she is having a hard time keeping a schedule of any kind, is troubled by the need of napping throughout the day, she does report that her symptoms have become worse since she has her SVT ablation in June 2023.  We will proceed with a laboratory attended sleep study to look for an underlying organic cause of her sleep disturbance.  If she has obstructive sleep apnea or sleep disordered breathing, she may benefit from CPAP or AutoPap machine.  We talked about this as well.  We will plan to follow-up accordingly.  She would be willing to come in for a nighttime sleep study, given her schedule, it may be difficult for her to come in for a traditional nighttime sleep study but she would like to try it.  We could pursue a home sleep test but it would be strictly a screening tool for sleep disordered breathing and would be limited.  A daytime sleep study will be difficult as she typically falls asleep before our daytime sleep tests are conducted, her schedule may be difficult to accommodate but we will certainly try and pursue a nighttime sleep study for now.  Her husband will bring her and pick her up.  She is reminded to bring all her nighttime medications for the test.  I answered all the questions today and the patient  and her husband were in agreement.   Thank you very much for allowing me to participate in the care of this nice patient. If I can be of any further assistance to you please do not hesitate to talk to me.  Sincerely,   Mia Foley, MD, PhD

## 2022-04-09 ENCOUNTER — Other Ambulatory Visit: Payer: Self-pay | Admitting: Internal Medicine

## 2022-04-09 ENCOUNTER — Encounter: Payer: Self-pay | Admitting: Internal Medicine

## 2022-04-09 ENCOUNTER — Ambulatory Visit: Payer: Medicaid Other | Attending: Internal Medicine | Admitting: Internal Medicine

## 2022-04-09 VITALS — BP 102/68 | HR 79 | Ht 69.0 in | Wt 193.4 lb

## 2022-04-09 DIAGNOSIS — G90A Postural orthostatic tachycardia syndrome (POTS): Secondary | ICD-10-CM

## 2022-04-09 MED ORDER — MIDODRINE HCL 2.5 MG PO TABS: ORAL_TABLET | ORAL | 1 refills | Status: DC

## 2022-04-09 NOTE — Progress Notes (Signed)
And 4-second pauses and A-fib are okay anyway but they were all in the middle of the     Patient Care Team: Stamey, Girtha Rm, FNP as PCP - General (Family Medicine) Antony Blackbird, MD as Referring Physician (Family Medicine)   HPI  Mia Rubio is a 42 y.o. female Seen in follow-up POTS  Her dizziness in the past has been recumbent associated with normal vital signs which to me suggested a noncardiac perhaps neuropsychiatric origin. It was recommended that she be referred for consideration of these components. There also was a component of heat and orthostatic intolerance.  An abdominal binder helped considerably    She is here having lost 2 children miscarriages in the last 6 months.  The first was associated with profound and persistent bleeding and resulted in exercise intolerance bedrest and profound dizziness.  She recalls that when she had hormonal suppression of her periods she felt the best that she had in years.  She and her husband are continuing to try to have a second child.  Hence got pregnant again and she miscarried her second child about 6 months ago.  That is also the timeframe in which she began to have significantly worsened symptoms.  When seen 3/19, >>"She says she is barely able to sit up without being dizzy.  She is not able to stand well.  Fluorescent lights are bothersome.  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    Complains of shortness of breath and since then has had worsening complaints of dyspnea, some chest pain including something that took her to the ER, exercise intolerance fatigue and sleepiness.  Her atenolol has been gradually uptitrated because of recurrent SVT.  She is mostly in bed.  Depressed.  Feels like is never really get  better and her body is just failed her.  Feels like a burden in her marriage as her husband is having to do all the work.  Struggles with sleep.  Falls asleep and then wakes up and cannot go back to sleep.  Neurology has suggested sleep study>>  Husband did call the other week with concerns that there were new symptoms, heat intolerance and sweating leg swelling..  Complaints on presentation include swelling and more shortness of breath.  Her sodium intake has been about 2 g/day.  She also feels that ever since the ablation, her POTS symptoms are worse, specifically orthostatic intolerance dizziness and weakness.    Date Cr K Hgb  3/21  0.81   4.3   7/23  0.94 4.0 14.9       Past Medical History:  Diagnosis Date   Common migraine with intractable migraine 12/19/2017   Hypotension    Low blood pressure    Panic disorder 12/19/2017   "something that happens when I have a flare" regarding POTS   POTS (postural orthostatic tachycardia syndrome)    SVT (supraventricular tachycardia)    Tachycardia     Past Surgical History:  Procedure Laterality Date   APPENDECTOMY     BREAST SURGERY     CESAREAN SECTION N/A 08/19/2014   Procedure: CESAREAN SECTION;  Surgeon: Janyth Pupa, DO;  Location: [facility];  Service: Obstetrics;  Laterality: N/A;   DILATION AND EVACUATION N/A 11/24/2016   Procedure: DILATATION AND EVACUATION;  Surgeon: Raphael Gibney,  Arnell Sieving, MD;  Location: [facility];  Service: Gynecology;  Laterality: N/A;   FOOT SURGERY  2011 2012   KNEE SURGERY  2001  2002   plantar fasciitis     SVT ABLATION N/A 08/22/2021   Procedure: SVT ABLATION;  Surgeon: Evans Lance, MD;  Location: [facility];  Service: Cardiovascular;  Laterality: N/A;    Current Outpatient Medications  Medication Sig Dispense Refill   acetaminophen (TYLENOL 8 HOUR) 650 MG CR tablet 2 tablets as needed     ALPRAZolam (XANAX XR) 1 MG 24 hr tablet Take 1 mg by mouth every morning.     ALPRAZolam (XANAX) 0.5 MG  tablet Take 0.5 mg by mouth daily at 6 PM.     atenolol (TENORMIN) 25 MG tablet Take 1 tablet (25 mg total) by mouth daily. 90 tablet 3   botulinum toxin Type A (BOTOX) 200 units injection INJECT 155 UNITS INTRAMUSCULARLY TO HEAD AND NECK EVERY 12 WEEKS  (DISCARD UNUSED AFTER FIRST USE) 1 each 1   budesonide-formoterol (SYMBICORT) 160-4.5 MCG/ACT inhaler Inhale 2 puffs into the lungs 2 (two) times daily. 1 each 4   buPROPion (WELLBUTRIN SR) 150 MG 12 hr tablet Take 150 mg by mouth every morning.     dicyclomine (BENTYL) 20 MG tablet Take 20 mg by mouth daily as needed for spasms.     diphenhydrAMINE (BENADRYL) 25 MG tablet Take 50 mg by mouth at bedtime.     fluticasone (FLONASE) 50 MCG/ACT nasal spray Place 1 spray into both nostrils as needed.     ibuprofen (ADVIL) 800 MG tablet TAKE 1 TABLET(800 MG) BY MOUTH THREE TIMES DAILY AS NEEDED 90 tablet 11   ipratropium (ATROVENT) 0.06 % nasal spray 2 sprays in each nostril     ketorolac (TORADOL) 60 MG/2ML SOLN injection Inject 1-67m (30-69m intramuscularly at onset of migraine. May repeat in 6 hours. Max twice a day and 4 days per month. 10 mL 4   meclizine (ANTIVERT) 25 MG tablet TAKE 1 TABLET(25 MG) BY MOUTH THREE TIMES DAILY AS NEEDED FOR DIZZINESS 30 tablet 0   midodrine (PROAMATINE) 2.5 MG tablet 1 tablet by mouth every 4 hours while awake 90 tablet 1   Multiple Vitamin (MULTIVITAMIN PO) Take 2 tablets by mouth at bedtime.     ondansetron (ZOFRAN-ODT) 8 MG disintegrating tablet DISSOLVE 1 TABLET(8 MG) UNDER THE TONGUE EVERY 8 HOURS AS NEEDED FOR NAUSEA OR VOMITING 30 tablet 11   Potassium 99 MG TABS 1 tablet     promethazine (PHENERGAN) 50 MG tablet Take 1 tablet (50 mg total) by mouth every 6 (six) hours as needed for nausea or vomiting. Take with benadryl for itching reaction. Can take with nurtec or rizatriptan for migraine. May cause sedation. 30 tablet 6   Rimegepant Sulfate (NURTEC) 75 MG TBDP Take 75 mg by mouth daily as needed. For  migraines. Take as close to onset of migraine as possible. One daily maximum. 16 tablet 11   Rimegepant Sulfate (NURTEC) 75 MG TBDP Take 75 mg by mouth daily as needed (Migraines). 16 tablet 11   rizatriptan (MAXALT-MLT) 10 MG disintegrating tablet Take 1 tablet (10 mg total) by mouth as needed for migraine. May repeat in 2 hours if needed 9 tablet 11   SLYND 4 MG TABS Take 4 mg by mouth daily.     SYRINGE-NEEDLE, DISP, 3 ML (B-D 3CC LUER-LOK SYR 25GX1") 25G X 1" 3 ML MISC Use with Toradol 4 each 5   Tiotropium  Bromide Monohydrate (SPIRIVA RESPIMAT) 1.25 MCG/ACT AERS Inhale 2 puffs into the lungs daily. 1 each 3   venlafaxine XR (EFFEXOR-XR) 150 MG 24 hr capsule TAKE 1 CAPSULE(150 MG) BY MOUTH DAILY WITH BREAKFAST 90 capsule 0   zolpidem (AMBIEN) 10 MG tablet Take 10 mg by mouth at bedtime.     No current facility-administered medications for this visit.    Allergies  Allergen Reactions   Caffeine Palpitations   Ciprofloxacin Other (See Comments)    Other reaction(s): itching   Codeine Itching   Meperidine Hcl Other (See Comments)    Other reaction(s): hives   Ondansetron Other (See Comments)    Other reaction(s): itching - can take with Benadryl   Phenergan [Promethazine] Other (See Comments)    Other reaction(s): hives   Prednisone Other (See Comments)    Other reaction(s): extreme dizziness   Septra [Sulfamethoxazole-Trimethoprim] Other (See Comments)    Other reaction(s): blurred vision, itching   Phenergan [Promethazine Hcl] Itching and Rash      Review of Systems negative except from HPI and PMH  Physical Exam BP 102/68   Pulse 79   Ht 5' 9"$  (1.753 m)   Wt 193 lb 6.4 oz (87.7 kg)   SpO2 98%   BMI 28.56 kg/m  Well developed and nourished in no acute distress HENT normal Neck   No Clubbing cyanosis edema Skin-warm and dry A & Oriented  Grossly normal sensory and motor function  ECG sinus at 82 Intervals 13/09/35   Assessment and  Plan  Syncope  Swelling  dyspnea on exertion  Orthostatic and heat intolerance  Postural Tachycardia  Nonpositional tachycardia  Elevated blood pressure  Miscarriages--multiple  Anxiety  SVT status post ablation with subsequent dysautonomic decompensation  Will continue the beta-blockers as they have been much more helpful.  Blood pressure is a little bit low we will continue anyway.  With her swelling and shortness of breath, we will decrease her sodium intake by 50%.  With her blood pressure being low have elected, in the context of her not being able to tolerate much in the way of compression because of the heat, started her on low-dose ProAmatine 2.5 mg every 4.  I encouraged her to use her abdominal binder for the first couple of hours when she awakens and to raise the head of her bed 2 inches as this has been a particularly problematic time.  I have also suggested that she try and use a thigh sleeve 1 or 2 as best as she can to help during the day.  I have read to Dr. Raquel Sarna who published a paper on POTS being seemingly triggered by slow pathway modification for AV nodal reentry to see if he had any follow-up or any specific recommendations.

## 2022-04-09 NOTE — Patient Instructions (Addendum)
Medication Instructions:  Your physician has recommended you make the following change in your medication:   ** Begin Proamatine 2.8m - 1 tablet every 4 hours while awake  *If you need a refill on your cardiac medications before your next appointment, please call your pharmacy*   Lab Work: None ordered.  If you have labs (blood work) drawn today and your tests are completely normal, you will receive your results only by: [facility](if you have MyChart) OR A paper copy in the mail If you have any lab test that is abnormal or we need to change your treatment, we will call you to review the results.   Testing/Procedures: None ordered.    Follow-Up: [facility] and your health needs are our priority.  As part of our continuing mission to provide you with exceptional heart care, we have created designated Provider Care Teams.  These Care Teams include your primary Cardiologist (physician) and Advanced Practice Providers (APPs -  Physician Assistants and Nurse Practitioners) who all work together to provide you with the care you need, when you need it.  We recommend signing up for the patient portal called "MyChart".  Sign up information is provided on this After Visit Summary.  MyChart is used to connect with patients for Virtual Visits (Telemedicine).  Patients are able to view lab/test results, encounter notes, upcoming appointments, etc.  Non-urgent messages can be sent to your provider as well.   To learn more about what you can do with MyChart, go to [email or web address]    Your next appointment:   3 month telephone visit with Dr KCaryl Comes Other Instructions Decrease Propel use to half the amount you drink normally in a day.  Purchase an abdominal binder

## 2022-04-10 ENCOUNTER — Encounter: Payer: Self-pay | Admitting: Nurse Practitioner

## 2022-04-10 ENCOUNTER — Other Ambulatory Visit: Payer: Self-pay | Admitting: Nurse Practitioner

## 2022-04-10 ENCOUNTER — Encounter: Payer: Self-pay | Admitting: Internal Medicine

## 2022-04-10 DIAGNOSIS — Z1231 Encounter for screening mammogram for malignant neoplasm of breast: Secondary | ICD-10-CM

## 2022-04-17 ENCOUNTER — Telehealth: Payer: Self-pay | Admitting: Neurology

## 2022-04-17 NOTE — Telephone Encounter (Signed)
[facility] community pending uploaded notes on the portal

## 2022-04-23 ENCOUNTER — Telehealth: Payer: Self-pay | Admitting: *Deleted

## 2022-04-23 NOTE — Telephone Encounter (Signed)
Our office received a dental clearance for the pt. I have called and left message for the DDS office to call our office for clarification. We cannot provide a blanket type clearance.      Pre-operative Risk Assessment    Patient Name: Mia Rubio  DOB: October 06, 1980 MRN: [id number]     Request for Surgical Clearance    Procedure:  2 FILLINGS Date of Surgery:  Clearance TBD                                 Surgeon:  DR. Garnette Scheuermann, DDS Surgeon's Group or Practice Name: [facility] Phone number: [phone] Fax number:  [phone]   Type of Clearance Requested:   - Medical ; NO MEDICATIONS LISTED AS NEEDING TO BE HELD   Type of Anesthesia:  Local    Additional requests/questions:    Jiles Prows   04/23/2022, 2:17 PM

## 2022-04-23 NOTE — Telephone Encounter (Signed)
   Patient Name: Mia Rubio  DOB: 1981/02/08 MRN: [id number]  Primary Cardiologist: Dr. Caryl Comes  Simple dental extractions/ procedures (i.e. 1-2 teeth) are considered low risk procedures per guidelines and generally do not require any specific cardiac clearance. It is also generally accepted that for simple extractions and dental cleanings, there is no need to interrupt blood thinner therapy.  SBE prophylaxis is not required for the patient from a cardiac standpoint.  I will route this recommendation to the requesting party via Epic fax function and remove from pre-op pool.  Please call with questions.  Darreld Mclean, PA-C 04/23/2022, 3:03 PM

## 2022-04-23 NOTE — Telephone Encounter (Signed)
Checked status on the portal it is still pending.

## 2022-04-27 ENCOUNTER — Other Ambulatory Visit: Payer: Self-pay | Admitting: Neurology

## 2022-04-29 NOTE — Progress Notes (Signed)
Virtual Visit via Video Note  I connected with [place] on 04/29/22 at  3:00 PM EST by a video enabled telemedicine application and verified that I am speaking with the correct person using two identifiers.  Location: Patient: Home Provider: Office - [facility] - [address], [address], [place], [place] [address]  I discussed the limitations of evaluation and management by telemedicine and the availability of in person appointments. The patient expressed understanding and agreed to proceed. I also discussed with the patient that there may be a patient responsible charge related to this service. The patient expressed understanding and agreed to proceed.  Patient consented to consult via telephone: Yes People present and their role in pt care: Pt   History of Present Illness:  42 y.o. woman with POTS and likely asthma whom we are seeing for follow-up of dyspnea on exertion.  Overall, things doing a little bit better.  Seems like POTS under a bit better controlled.  But also, was prescribed Symbicort in the interim.  This was in response to pulmonary function test that did show some air trapping, otherwise normal PFTs.  It is helped some.  However she is using her albuterol at least once or twice a day.  She finds albuterol to do to be beneficial.  Help with symptoms some.  Discussed role and rationale for adding additional bronchodilators if helps further with her symptoms.  Discussed at length I discussed with her EP physicians regarding ablation.  No concern for pulmonary vein stenosis as they did not go to the left side, everything was done on the right.   Chief complaint:  Dyspnea on exertion, shortness of breath  Observations/Objective:  Social History   Tobacco Use  Smoking Status Former   Packs/day: 1.00   Years: 14.00   Total pack years: 14.00   Types: Cigarettes   Quit date: 2016   Years since quitting: 8.1  Smokeless Tobacco Never  Tobacco Comments    Pt states she vapes and it has nicotine.    Immunization History  Administered Date(s) Administered   DTaP 03/15/1982, 05/03/1982, 07/04/1982, 07/05/1983, 01/05/1987   HIB (PRP-T) 12/28/1983   HIB, Unspecified 12/28/1983   Hepatitis B, PED/ADOLESCENT 01/03/1994, 02/02/1994, 07/05/1994   MMR 03/29/1983, 04/03/1987, 08/21/2014   OPV 03/15/1982, 05/03/1982, 07/05/1983, 01/05/1987   Td (Adult),5 Lf Tetanus Toxid, Preservative Free 09/03/1995, 10/16/2004   Tdap 03/18/2019    Assessment and Plan:  Dyspnea on exertion: Likely multifactorial and contributed by cardiac issues.  PFTs largely normal did show air trapping.  Symbicort was prescribed in the interim.  Mild improvement but not super significant.  Addition of Spiriva (new prescription today) to achieve triple inhaled therapy.  Reviewed imaging that again is clear.  Follow Up Instructions:  Return in about 3 months (around 07/01/2022).    I discussed the assessment and treatment plan with the patient. The patient was provided an opportunity to ask questions and all were answered. The patient agreed with the plan and demonstrated an understanding of the instructions.   The patient was advised to call back or seek an in-person evaluation if the symptoms worsen or if the condition fails to improve as anticipated.  I provided 30 minutes of total time during this encounter.   Lanier Clam, MD

## 2022-05-01 NOTE — Telephone Encounter (Signed)
04/30/22 left VM to sched [place]  04/30/22 [facility] community auth: [id number] (exp. 04/17/22 to 07/27/22) EE

## 2022-05-02 ENCOUNTER — Telehealth: Payer: Self-pay | Admitting: Neurology

## 2022-05-02 NOTE — Telephone Encounter (Signed)
Patient called the on call service complaining of zapping feeling on her eyes when looking to left or right. She also reports URI sx and tested positive for COVID. She is asking for any additional recommendations to help with the pain.  I have advised her OTC Tylenol, Advil, using cold compress around her eye and I have also informed  her that I will update Dr. Jaynee Eagles and she will contact you if she has additional recommendations. She voices understanding.    Dr. April Manson

## 2022-05-03 NOTE — Telephone Encounter (Signed)
Unfortunately I can't think of a neurological condition that makes you feel like youe eyes zap when looking left and right and we did an MRi rain not too long ago. I recommend seeing eye docotr and if the symptoms don't go away (could be covid, does all kinds of strange things)I see her for botox in a few weeks and we can discuss then sorry!

## 2022-05-15 ENCOUNTER — Encounter: Payer: Self-pay | Admitting: Internal Medicine

## 2022-05-15 ENCOUNTER — Encounter: Payer: Self-pay | Admitting: Pulmonary Disease

## 2022-05-15 ENCOUNTER — Encounter: Payer: Self-pay | Admitting: Neurology

## 2022-05-15 ENCOUNTER — Other Ambulatory Visit (HOSPITAL_COMMUNITY): Payer: Self-pay

## 2022-05-15 ENCOUNTER — Other Ambulatory Visit: Payer: Self-pay

## 2022-05-15 ENCOUNTER — Telehealth: Payer: Self-pay | Admitting: Neurology

## 2022-05-15 NOTE — Telephone Encounter (Signed)
Botox delivery pending pt consent.

## 2022-05-16 ENCOUNTER — Encounter: Payer: Self-pay | Admitting: Neurology

## 2022-05-16 NOTE — Telephone Encounter (Signed)
[facility] Josem [id number] (exp. 04/17/22 to 07/27/22)   Patient is scheduled at [facility] for 07/16/22 at 8 pm.  Sent patient mychart message with appointment information.

## 2022-05-16 NOTE — Telephone Encounter (Signed)
I crafted a letter and routed it to her via [place].  Thank you.

## 2022-05-21 NOTE — Telephone Encounter (Signed)
  Thank you for your message seeking medical advice.* My assessment and recommendation are as follows: Hi Tessica, I reviewed your chart in detail. Unfortunateky I can only document what I have in my notes legally and you can provide all those notes to the disability agency. In fact, they will require them. Everything is in the notes already in fact the last time I saw you I documented a lot of the things you mentioned such as the light sensitivity.   I think if the disability dept reads all my notes they will see this and everything else you have told me over the years. I hope that helps. Thanks, Dr. Jaynee Eagles  Sincerely,  Jaynee Eagles Larina Bras, MD    *This exchange required the expertise of a doctor, nurse practitioner, physician assistant, optometrist or certified nurse midwife and qualifies as a Medical Advice Message, please visit [email or web address] for more details. [facility] will bill your insurance on your behalf; copays and deductibles may apply. Questions? Reply to this message.

## 2022-05-21 NOTE — Telephone Encounter (Signed)
Wells Guiles is calling from [employer]. Stated  Botox will be delivered 3/26 with signature confirmation.

## 2022-05-23 ENCOUNTER — Encounter (INDEPENDENT_AMBULATORY_CARE_PROVIDER_SITE_OTHER): Payer: Medicaid Other | Admitting: Neurology

## 2022-05-23 DIAGNOSIS — G43709 Chronic migraine without aura, not intractable, without status migrainosus: Secondary | ICD-10-CM

## 2022-05-24 ENCOUNTER — Ambulatory Visit: Payer: Medicaid Other

## 2022-05-28 ENCOUNTER — Ambulatory Visit: Payer: Medicaid Other | Admitting: Neurology

## 2022-05-28 ENCOUNTER — Encounter: Payer: Self-pay | Admitting: Internal Medicine

## 2022-05-29 ENCOUNTER — Ambulatory Visit: Payer: Medicaid Other | Admitting: Neurology

## 2022-05-29 DIAGNOSIS — G43709 Chronic migraine without aura, not intractable, without status migrainosus: Secondary | ICD-10-CM

## 2022-05-29 MED ORDER — MIDODRINE HCL 2.5 MG PO TABS: ORAL_TABLET | ORAL | 11 refills | Status: DC

## 2022-05-29 MED ORDER — ONABOTULINUMTOXINA 200 UNITS IJ SOLR
155.0000 [IU] | Freq: Once | INTRAMUSCULAR | Status: AC
  Administered 2022-05-29: 155 [IU] via INTRAMUSCULAR

## 2022-05-29 NOTE — Patient Instructions (Signed)
05/29/2022: stable 03/05/2022: stable doing great  11/30/2021: Prior to botox daily headaches and most of the month migraines maybe even daily. Botox has changed her life. She has a lot of light sensitivity. But even the light sensitivity is better, she wears FL-41. After botox has 6 evere migraine days a month and < 14 total headache days a month significant >>50% improvement on botox in frequ and severity. Failed ubrelvy, imitrex, rizatriptan, zomig. Can try ubrelvy if nurtec not approved. Affects her ability to drive, light sensitivity affects her ability to go outside, filled out a car tinting form, still has 6 -7 severe migraine days a month and headaches, has POTS with fatigue and exercise instability.  She cannot work 8 hours straight without restm migraines exacerbated by fatigue, lights, exertion, significantly impacting her life despite improvement by botox. Higher incidence of POTS with migraines.   09/07/2021: Botox is working well.  06/14/2021: Reports botox working well. Two weeks late for botox so she has had more migraines. 1/ week migraine when taking botox consistently. Never severe.    BOTOX PROCEDURE NOTE FOR MIGRAINE HEADACHE    Contraindications and precautions discussed with patient(above). Aseptic procedure was observed and patient tolerated procedure. Procedure performed by Sarina Ill md  The condition has existed for more than 6 months, and pt does not have a diagnosis of ALS, Myasthenia Gravis or Lambert-Eaton Syndrome.  Risks and benefits of injections discussed and pt agrees to proceed with the procedure.  Written consent obtained  These injections are medically necessary. These injections do not cause sedations or hallucinations which the oral therapies may cause.  Indication/Diagnosis: chronic migraine  Description of procedure:  The patient was placed in a sitting position. The standard protocol was used for Botox as follows, with 5 units of Botox injected at  each site:   -Procerus muscle, midline injection  -Corrugator muscle, bilateral injection  -Frontalis muscle, bilateral injection, with 2 sites each side, medial injection was performed in the upper one third of the frontalis muscle, in the region vertical from the medial inferior edge of the superior orbital rim. The lateral injection was again in the upper one third of the forehead vertically above the lateral limbus of the cornea, 1.5 cm lateral to the medial injection site.  -Temporalis muscle injection, 4 sites, bilaterally. The first injection was 3 cm above the tragus of the ear, second injection site was 1.5 cm to 3 cm up from the first injection site in line with the tragus of the ear. The third injection site was 1.5-3 cm forward between the first 2 injection sites. The fourth injection site was 1.5 cm posterior to the second injection site.  -Occipitalis muscle injection, 3 sites, bilaterally. The first injection was done one half way between the occipital protuberance and the tip of the mastoid process behind the ear. The second injection site was done lateral and superior to the first, 1 fingerbreadth from the first injection. The third injection site was 1 fingerbreadth superiorly and medially from the first injection site.  -Cervical paraspinal muscle injection, 2 sites, bilateral knee first injection site was 1 cm from the midline of the cervical spine, 3 cm inferior to the lower border of the occipital protuberance. The second injection site was 1.5 cm superiorly and laterally to the first injection site.  -Trapezius muscle injection was performed at 3 sites, bilaterally. The first injection site was in the upper trapezius muscle halfway between the inflection point of the neck, and the acromion.  The second injection site was one half way between the acromion and the first injection site. The third injection was done between the first injection site and the inflection point of the  neck.   Will return for repeat injection in 3 months.   A 200 units of Botox was used, 155 units were injected, the rest of the Botox was wasted 45u. The patient tolerated the procedure well, there were no complications of the above procedure.  Sarina Ill, MD [facility] [address], [place], [place] [address] [phone]

## 2022-05-29 NOTE — Progress Notes (Addendum)
 03/05/2022: stable doing great  11/30/2021: Prior to botox daily headaches and most of the month migraines maybe even daily. Botox has changed her life. She has a lot of light sensitivity. But even the light sensitivity is better, she wears FL-41. After botox has 6 evere migraine days a month and < 14 total headache days a month significant >>50% improvement on botox in frequ and severity. Failed ubrelvy, imitrex, rizatriptan, zomig. Can try ubrelvy if nurtec not approved. Affects her ability to drive, light sensitivity affects her ability to go outside, filled out a car tinting form, still has 6 -7 severe migraine days a month and headaches, has POTS with fatigue and exercise instability.  She cannot work 8 hours straight without restm migraines exacerbated by fatigue, lights, exertion, significantly impacting her life despite improvement by botox. Higher incidence of POTS with migraines.   09/07/2021: Botox is working well.  06/14/2021: Reports botox working well. Two weeks late for botox so she has had more migraines. 1/ week migraine when taking botox consistently. Never severe.    BOTOX PROCEDURE NOTE FOR MIGRAINE HEADACHE    Contraindications and precautions discussed with patient(above). Aseptic procedure was observed and patient tolerated procedure. Procedure performed by Megan Millikan, NP  The condition has existed for more than 6 months, and pt does not have a diagnosis of ALS, Myasthenia Gravis or Lambert-Eaton Syndrome.  Risks and benefits of injections discussed and pt agrees to proceed with the procedure.  Written consent obtained  These injections are medically necessary. These injections do not cause sedations or hallucinations which the oral therapies may cause.  Indication/Diagnosis: chronic migraine  Description of procedure:  The patient was placed in a sitting position. The standard protocol was used for Botox as follows, with 5 units of Botox injected at each  site:   -Procerus muscle, midline injection  -Corrugator muscle, bilateral injection  -Frontalis muscle, bilateral injection, with 2 sites each side, medial injection was performed in the upper one third of the frontalis muscle, in the region vertical from the medial inferior edge of the superior orbital rim. The lateral injection was again in the upper one third of the forehead vertically above the lateral limbus of the cornea, 1.5 cm lateral to the medial injection site.  -Temporalis muscle injection, 4 sites, bilaterally. The first injection was 3 cm above the tragus of the ear, second injection site was 1.5 cm to 3 cm up from the first injection site in line with the tragus of the ear. The third injection site was 1.5-3 cm forward between the first 2 injection sites. The fourth injection site was 1.5 cm posterior to the second injection site.  -Occipitalis muscle injection, 3 sites, bilaterally. The first injection was done one half way between the occipital protuberance and the tip of the mastoid process behind the ear. The second injection site was done lateral and superior to the first, 1 fingerbreadth from the first injection. The third injection site was 1 fingerbreadth superiorly and medially from the first injection site.  -Cervical paraspinal muscle injection, 2 sites, bilateral knee first injection site was 1 cm from the midline of the cervical spine, 3 cm inferior to the lower border of the occipital protuberance. The second injection site was 1.5 cm superiorly and laterally to the first injection site.  -Trapezius muscle injection was performed at 3 sites, bilaterally. The first injection site was in the upper trapezius muscle halfway between the inflection point of the neck, and the acromion. The second   injection site was one half way between the acromion and the first injection site. The third injection was done between the first injection site and the inflection point of the  neck.   Will return for repeat injection in 3 months.   A 200 units of Botox was used, 155 units were injected, the rest of the Botox was wasted. The patient tolerated the procedure well, there were no complications of the above procedure.  Delainey Winstanley, MD [facility] [address], [address] , [place] [address] [phone] 

## 2022-05-29 NOTE — Progress Notes (Signed)
Botox- 200 units x 1 vial Lot: [id number] Expiration: 2026/07 NDC: 0023-3921-02  Bacteriostatic 0.9% Sodium Chloride- 4 mL total Lot: [id number] Expiration: 11/25 NDC: [id number]  Dx: [id number] S/P  Witnessed by DIAMOND

## 2022-06-01 ENCOUNTER — Other Ambulatory Visit: Payer: Self-pay | Admitting: Neurology

## 2022-06-08 ENCOUNTER — Encounter: Payer: Self-pay | Admitting: Neurology

## 2022-06-08 ENCOUNTER — Encounter: Payer: Self-pay | Admitting: Internal Medicine

## 2022-06-09 ENCOUNTER — Other Ambulatory Visit: Payer: Self-pay | Admitting: Neurology

## 2022-06-13 ENCOUNTER — Other Ambulatory Visit: Payer: Self-pay

## 2022-06-15 ENCOUNTER — Other Ambulatory Visit (HOSPITAL_COMMUNITY): Payer: Self-pay

## 2022-06-15 ENCOUNTER — Encounter (HOSPITAL_COMMUNITY): Payer: Self-pay

## 2022-06-18 ENCOUNTER — Encounter: Payer: Self-pay | Admitting: *Deleted

## 2022-06-18 NOTE — Telephone Encounter (Signed)
  Thank you for your message seeking medical advice.* My assessment and recommendation are as follows: To whom it may concern: I have been treating patient since 2022 for chronic migraines, headaches generalized light sensitivity vestibular disturbances.  She has a past medical history which includes POTS.  She has seen ophthalmologist and neurology for visual disturbances and light sensitivity which she reports makes it extremely difficult for her to drive any vehicle and has not been driving in several years because of the depth perception problems, light sensitivity and problems and vestibular symptoms; also light can often times trigger migraines, and the visual aura can impair her vision.  Prior to myself she was seeing another neurologist in my group Dr. Anne Hahn for her migraine headaches, she reported the same symptoms to him, including longstanding history of motion sickness and visual problems and particular lighting such as fluorescent lights may worsen her symptoms and give her anxiety and panic attacks.  But the most debilitating symptom is that she has light sensitivity she cannot drive any sort of vehicle.  When we initially saw her in 2022 she had 28 headache days a month and 20 of those were migrainous with moderate to severe pulsating pounding throbbing movement making it worse lasting up to 24 to 72 hours ongoing with auras and visual defects.  Also blurry vision with the aura with the headaches.  We initiated her on multiple medications eventually Botox for migraines every 3 months, imaged her with MRI of the brain, discussed FL 41, some special glasses and Ally green-light  lamp which she tried, car tinting, the new CGRP medications.  To my knowledge she has tried them all without relief per patient.   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  She is exhausted from her POTS per patient.  She reports the ocular migraines impair her vision.  She reports severe brain fog during her migraines.  She also reports electric shocks running through her eyes and tongue.  She reports that in addition to her migraine pain with and with aura, silent ocular visual migraines also affecting her day-to-day life and quality because she cannot drive or perform her job and affecting the quality of life with her family.   Sincerely,  [name], MD    *This exchange required the expertise of a doctor, nurse practitioner, physician assistant, optometrist or certified nurse midwife and qualifies as a Medical Advice Message, please visit [email or web address] for more details. [facility] will bill your insurance on your behalf; copays and deductibles may apply. Questions? Reply to this message.

## 2022-06-20 ENCOUNTER — Ambulatory Visit: Payer: Medicaid Other | Attending: Internal Medicine | Admitting: Internal Medicine

## 2022-06-20 ENCOUNTER — Encounter: Payer: Self-pay | Admitting: Internal Medicine

## 2022-06-20 ENCOUNTER — Telehealth: Payer: Self-pay

## 2022-06-20 ENCOUNTER — Other Ambulatory Visit: Payer: Self-pay

## 2022-06-20 VITALS — BP 93/74 | HR 88 | Ht 69.0 in | Wt 188.2 lb

## 2022-06-20 DIAGNOSIS — Z8679 Personal history of other diseases of the circulatory system: Secondary | ICD-10-CM

## 2022-06-20 DIAGNOSIS — G90A Postural orthostatic tachycardia syndrome (POTS): Secondary | ICD-10-CM

## 2022-06-20 DIAGNOSIS — I471 Supraventricular tachycardia, unspecified: Secondary | ICD-10-CM | POA: Diagnosis not present

## 2022-06-20 MED ORDER — MIDODRINE HCL 5 MG PO TABS: 5.0000 mg | ORAL_TABLET | Freq: Three times a day (TID) | ORAL | 3 refills | Status: DC

## 2022-06-20 MED ORDER — IVABRADINE HCL 5 MG PO TABS: 5.0000 mg | ORAL_TABLET | Freq: Two times a day (BID) | ORAL | 3 refills | Status: DC

## 2022-06-20 NOTE — Progress Notes (Signed)
Electrophysiology TeleHealth Note      Date:  06/20/2022   ID:  Mia Rubio, DOB 01/28/1981, MRN [id number]  Location: patient's home  Provider location: [address] [place]  Evaluation Performed: Follow-up visit  PCP:  Mia Rubio, Mia Cumins, FNP  Cardiologist:    Electrophysiologist:  SK   Chief Complaint:  tachypalpitation  History of Present Illness:    Mia Rubio is a 42 y.o. female who presents via audio/video conferencing for a telehealth visit today.  Since last being seen in our clinic for POTS with worsening of symptoms following AV nodal reentry ablation manifested by dyspnea the patient reports    Mentally better--marital stress Physically  BP 80s even with midodrine, taking   Exertional tachypalpitations    The patient denies symptoms of fevers, chills, cough, or new SOB worrisome for COVID 19.    Past Medical History:  Diagnosis Date   Common migraine with intractable migraine 12/19/2017   Hypotension    Low blood pressure    Panic disorder 12/19/2017   "something that happens when I have a flare" regarding POTS   POTS (postural orthostatic tachycardia syndrome)    SVT (supraventricular tachycardia)    Tachycardia     Past Surgical History:  Procedure Laterality Date   APPENDECTOMY     BREAST SURGERY     CESAREAN SECTION N/A 08/19/2014   Procedure: CESAREAN SECTION;  Surgeon: Myna Hidalgo, DO;  Location: [facility];  Service: Obstetrics;  Laterality: N/A;   DILATION AND EVACUATION N/A 11/24/2016   Procedure: DILATATION AND EVACUATION;  Surgeon: Kirkland Hun, MD;  Location: [facility];  Service: Gynecology;  Laterality: N/A;   FOOT SURGERY  2011 2012   KNEE SURGERY  2001  2002   plantar fasciitis     SVT ABLATION N/A 08/22/2021   Procedure: SVT ABLATION;  Surgeon: Marinus Maw, MD;  Location: [facility] INVASIVE CV LAB;  Service: Cardiovascular;  Laterality: N/A;    Current Outpatient Medications  Medication Sig Dispense Refill    acetaminophen (TYLENOL 8 HOUR) 650 MG CR tablet 2 tablets as needed     ALPRAZolam (XANAX XR) 1 MG 24 hr tablet Take 1 mg by mouth every morning.     ALPRAZolam (XANAX) 0.5 MG tablet Take 0.5 mg by mouth daily at 6 PM.     atenolol (TENORMIN) 25 MG tablet Take 1 tablet (25 mg total) by mouth daily. 90 tablet 3   botulinum toxin Type A (BOTOX) 200 units injection INJECT 155 UNITS INTRAMUSCULARLY TO HEAD AND NECK EVERY 12 WEEKS  (DISCARD UNUSED AFTER FIRST USE) 1 each 1   budesonide-formoterol (SYMBICORT) 160-4.5 MCG/ACT inhaler Inhale 2 puffs into the lungs 2 (two) times daily. 1 each 4   buPROPion (WELLBUTRIN SR) 150 MG 12 hr tablet Take 150 mg by mouth every morning.     dicyclomine (BENTYL) 20 MG tablet Take 20 mg by mouth daily as needed for spasms.     diphenhydrAMINE (BENADRYL) 25 MG tablet Take 50 mg by mouth at bedtime.     fluticasone (FLONASE) 50 MCG/ACT nasal spray Place 1 spray into both nostrils as needed.     ibuprofen (ADVIL) 800 MG tablet TAKE 1 TABLET(800 MG) BY MOUTH THREE TIMES DAILY AS NEEDED 90 tablet 11   ipratropium (ATROVENT) 0.06 % nasal spray 2 sprays in each nostril     ketorolac (TORADOL) 60 MG/2ML SOLN injection Inject 1-36ml (30-60mg ) intramuscularly at onset of migraine. May repeat in 6  hours. Max twice a day and 4 days per month. 10 mL 4   meclizine (ANTIVERT) 25 MG tablet TAKE 1 TABLET(25 MG) BY MOUTH THREE TIMES DAILY AS NEEDED FOR DIZZINESS 30 tablet 0   midodrine (PROAMATINE) 2.5 MG tablet TAKE 1 TABLET BY MOUTH EVERY 4 HOURS WHILE AWAKE (Patient taking differently: Take 5 mg by mouth. 1- 2 times daily) 120 tablet 11   Multiple Vitamin (MULTIVITAMIN PO) Take 2 tablets by mouth at bedtime.     ondansetron (ZOFRAN-ODT) 8 MG disintegrating tablet DISSOLVE 1 TABLET(8 MG) UNDER THE TONGUE EVERY 8 HOURS AS NEEDED FOR NAUSEA OR VOMITING 30 tablet 11   Potassium 99 MG TABS 1 tablet     promethazine (PHENERGAN) 50 MG tablet Take 1 tablet (50 mg total) by mouth every 6 (six)  hours as needed for nausea or vomiting. Take with benadryl for itching reaction. Can take with nurtec or rizatriptan for migraine. May cause sedation. 30 tablet 6   Rimegepant Sulfate (NURTEC) 75 MG TBDP Take 75 mg by mouth daily as needed. For migraines. Take as close to onset of migraine as possible. One daily maximum. 16 tablet 11   Rimegepant Sulfate (NURTEC) 75 MG TBDP Take 75 mg by mouth daily as needed (Migraines). 16 tablet 11   rizatriptan (MAXALT-MLT) 10 MG disintegrating tablet Take 1 tablet (10 mg total) by mouth as needed for migraine. May repeat in 2 hours if needed. Do not exceed 2 tablets in 24 hours. Do not take more than 2-3 times per week. 9 tablet 5   SLYND 4 MG TABS Take 4 mg by mouth daily.     SYRINGE-NEEDLE, DISP, 3 ML (B-D 3CC LUER-LOK SYR 25GX1") 25G X 1" 3 ML MISC Use with Toradol 4 each 5   Tiotropium Bromide Monohydrate (SPIRIVA RESPIMAT) 1.25 MCG/ACT AERS Inhale 2 puffs into the lungs daily. 1 each 3   venlafaxine XR (EFFEXOR-XR) 150 MG 24 hr capsule TAKE 1 CAPSULE(150 MG) BY MOUTH DAILY WITH BREAKFAST 90 capsule 0   zolpidem (AMBIEN) 10 MG tablet Take 10 mg by mouth at bedtime.     No current facility-administered medications for this visit.    Allergies:   Caffeine, Ciprofloxacin, Codeine, Meperidine hcl, Ondansetron, Phenergan [promethazine], Prednisone, Septra [sulfamethoxazole-trimethoprim], and Phenergan [promethazine hcl]      ROS:  Please see the history of present illness.   All other systems are personally reviewed and negative.    Exam:    Vital Signs:  BP 93/74   Pulse 88   Ht  (1.753 m)   Wt 188 lb 3.2 oz (85.4 kg)   BMI 27.79 kg/m         Labs/Other Tests and Data Reviewed:    Recent Labs: 08/31/2021: ALT 12; BUN 11; Creatinine, Ser 0.94; Hemoglobin 14.9; Platelets 287; Potassium 4.0; Sodium 136   Wt Readings from Last 3 Encounters:  06/20/22 188 lb 3.2 oz (85.4 kg)  04/09/22 193 lb 6.4 oz (87.7 kg)  04/04/22 195 lb (88.5 kg)          ASSESSMENT & PLAN:    Syncope   Swelling dyspnea on exertion   Orthostatic and heat intolerance   Postural Tachycardia   Nonpositional tachycardia   Elevated blood pressure   Miscarriages--multiple   Anxiety   SVT status post ablation with subsequent dysautonomic decompensation    Still relatively decompensated Will continue atenolol Begin ivabradine  Lots of marital stress    Follow-up:  8-10 weeks  Current medicines are reviewed at length with the patient today.   The patient has concerns regarding her medicines that she is running out of midodrnie .  The following changes were made today:  midodrine 5 mg tid Begin ivabradine 5mg  bid  [email or web address]  Labs/ tests ordered today include:   No orders of the defined types were placed in this encounter.     Today, I have spent 21 minutes with the patient with telehealth technology discussing the above.  Signed, Sherryl Manges, MD  06/20/2022 4:56 PM     [facility] [address] [place] [address] [phone] (office) [phone] (fax)

## 2022-06-20 NOTE — Telephone Encounter (Signed)
..   Pt understands that although there may be some limitations with this type of visit, we will take all precautions to reduce any security or privacy concerns.  Pt understands that this will be treated like an in office visit and we will file with pt's insurance, and there may be a patient responsible charge related to this service. ? ?

## 2022-06-20 NOTE — Patient Instructions (Addendum)
Medication Instructions:  Your physician has recommended you make the following change in your medication:    Increase Midodrine  to 3 times daily - new prescription sent to your pharmacy   Begin Ivabradine  - 1 tablet by mouth twice daily - prescription has been mailed.   *If you need a refill on your cardiac medications before your next appointment, please call your pharmacy*     Lab Work: None ordered.   If you have labs (blood work) drawn today and your tests are completely normal, you will receive your results only by: MyChart Message (if you have MyChart) OR A paper copy in the mail If you have any lab test that is abnormal or we need to change your treatment, we will call you to review the results.     Testing/Procedures: None ordered.       Follow-Up: At [facility], you and your health needs are our priority.  As part of our continuing mission to provide you with exceptional heart care, we have created designated Provider Care Teams.  These Care Teams include your primary Cardiologist (physician) and Advanced Practice Providers (APPs -  Physician Assistants and Nurse Practitioners) who all work together to provide you with the care you need, when you need it.   We recommend signing up for the patient portal called "MyChart".  Sign up information is provided on this After Visit Summary.  MyChart is used to connect with patients for Virtual Visits (Telemedicine).  Patients are able to view lab/test results, encounter notes, upcoming appointments, etc.  Non-urgent messages can be sent to your provider as well.   To learn more about what you can do with MyChart, go to [email or web address].     Your next appointment:   8-10 weeks - Please call [address] Odessa Fleming scheduler at [phone] to schedule this appointment.

## 2022-07-09 ENCOUNTER — Ambulatory Visit
Admission: RE | Admit: 2022-07-09 | Discharge: 2022-07-09 | Disposition: A | Discharge status: Home / Self Care | Payer: Medicaid Other | Source: Ambulatory Visit | Attending: Nurse Practitioner | Admitting: Nurse Practitioner

## 2022-07-09 DIAGNOSIS — Z1231 Encounter for screening mammogram for malignant neoplasm of breast: Secondary | ICD-10-CM

## 2022-07-16 ENCOUNTER — Encounter: Payer: Self-pay | Admitting: Neurology

## 2022-07-16 ENCOUNTER — Telehealth: Payer: Medicaid Other | Admitting: Internal Medicine

## 2022-07-16 ENCOUNTER — Ambulatory Visit (INDEPENDENT_AMBULATORY_CARE_PROVIDER_SITE_OTHER): Payer: Medicaid Other | Admitting: Neurology

## 2022-07-16 DIAGNOSIS — R519 Headache, unspecified: Secondary | ICD-10-CM

## 2022-07-16 DIAGNOSIS — R351 Nocturia: Secondary | ICD-10-CM

## 2022-07-16 DIAGNOSIS — E663 Overweight: Secondary | ICD-10-CM

## 2022-07-16 DIAGNOSIS — G4719 Other hypersomnia: Secondary | ICD-10-CM

## 2022-07-16 DIAGNOSIS — G901 Familial dysautonomia [Riley-Day]: Secondary | ICD-10-CM

## 2022-07-16 DIAGNOSIS — G4710 Hypersomnia, unspecified: Secondary | ICD-10-CM | POA: Diagnosis not present

## 2022-07-16 DIAGNOSIS — G90A Postural orthostatic tachycardia syndrome (POTS): Secondary | ICD-10-CM

## 2022-07-16 DIAGNOSIS — G4700 Insomnia, unspecified: Secondary | ICD-10-CM

## 2022-07-16 DIAGNOSIS — I471 Supraventricular tachycardia, unspecified: Secondary | ICD-10-CM

## 2022-07-17 NOTE — Procedures (Signed)
Physician Interpretation:       [facility] POLYSOMNOGRAPHY  INTERPRETATION REPORT   STUDY DATE:  07/16/2022     PATIENT NAME:  Mia Rubio         DATE OF BIRTH:  Oct 18, 1980  PATIENT ID:  [id number]    TYPE OF STUDY:  PSG  READING PHYSICIAN: Huston Foley, MD, PhD   SCORING TECHNICIAN: Domingo Cocking, RPSGT  Referred by: Dr. Naomie Dean  History and Indication for Testing: 42 year old female with an underlying medical history of chronic migraines, POTS, history of hypotension, SVT with status post ablation in June 2023, dysautonomia, and overweight state, who reports difficulty sleeping at night and and excessive daytime somnolence as well as recurrent headaches, chronic daytime fatigue. Her Epworth sleepiness score is 11 out of 24, fatigue severity score is 63 out of 63. Height: 69 in Weight: 195 lb (BMI 28)     MEDICATIONS: Symbicort, Wellbutrin SR, Bentyl, Benadryl, Advvil, Atrovent, Toradol, Antivert, Multivitamins, Nurtec, Maxalt, Slynd, Spiriva, Effexor, Ambien, CBD gummy   TECHNICAL DESCRIPTION: A registered sleep technologist was in attendance for the duration of the recording.  Data collection, scoring, video monitoring, and reporting were performed in compliance with the AASM Manual for the Scoring of Sleep and Associated Events; (Hypopnea is scored based on the criteria listed in Section VIII D. 1b in the AASM Manual V2.6 using a 4% oxygen desaturation rule or Hypopnea is scored based on the criteria listed in Section VIII D. 1a in the AASM Manual V2.6 using 3% oxygen desaturation and /or arousal rule).   SLEEP CONTINUITY AND SLEEP ARCHITECTURE:  The patient took Ambien and CBD gummy before lights out. Lights-out was at 22:11: and lights-on at  04:51:, with a total recording time time of 6 hours, 39.5 min. Total sleep time ( TST) was 318.5 minutes with a mildly decreased sleep efficiency at 79.7%. There was  9.1% REM sleep.    BODY POSITION:   TST was divided  between the following sleep positions: 8.3% supine;  91.7% lateral;  0% prone. Duration of total sleep and percent of total sleep in their respective position is as follows: supine 26 minutes (8%), non-supine 292 minutes (92%); right 44 minutes (14%), left 247 minutes (78%), and prone 00 minutes (0%).  Total supine REM sleep time was 00 minutes (0% of total REM sleep).  Sleep latency was normal at 16.5 minutes.  REM sleep latency was increased at 187.5 minutes. Of the total sleep time, the percentage of stage N1 sleep was 9.1%, which is increased, stage N2 sleep was 82%, which is markedly increased, stage N3 sleep was absent, and REM sleep was 9.1%, which is reduced. Wake after sleep onset (WASO) time accounted for 64.5 minutes with minimal to mild sleep fragmentation noted.    RESPIRATORY MONITORING:   Based on CMS criteria (using a 4% oxygen desaturation rule for scoring hypopneas), there were 2 apneas (1 obstructive; 0 central; 1 mixed), and 0 hypopneas.  Apnea index was 0.4. Hypopnea index was 0.0. The apnea-hypopnea index was 0.4 overall (0.0 supine, 2 non-supine; 2.1 REM, 0.0 supine REM).  There were 0 respiratory effort-related arousals (RERAs).  The RERA index was 0 events/h. Total respiratory disturbance index (RDI) was 0.4 events/h. RDI results showed: supine RDI  0.0 /h; non-supine RDI 0.4 /h; REM RDI 2.1 /h, supine REM RDI 0.0 /h.   Based on AASM criteria (using a 3% oxygen desaturation and /or arousal rule for scoring hypopneas), there were 2 apneas (  1 obstructive; 0 central; 1 mixed), and 3 hypopneas. Apnea index was 0.4. Hypopnea index was 0.6. The apnea-hypopnea index was 0.9/hour overall (2.3 supine, 2 non-supine; 2.1 REM, 0.0 supine REM).  There were 0 respiratory effort-related arousals (RERAs).  The RERA index was 0 events/h. Total respiratory disturbance index (RDI) was 0.9 events/h. RDI results showed: supine RDI  2.3 /h; non-supine RDI 0.8 /h; REM RDI 2.1 /h, supine  REM RDI 0.0 /h.    OXIMETRY: Oxyhemoglobin Saturation Nadir during sleep was at 92% from a mean of 95%.  Of the Total sleep time (TST)   hypoxemia (=<88%) was present for  3.3 minutes, or 1.0% of total sleep time.   LIMB MOVEMENTS: There were 0 periodic limb movements of sleep (0.0/hr), of which 0 (0.0/hr) were associated with an arousal.   AROUSAL: There were 70 arousals in total, for an arousal index of 13 arousals/hour.  Of these, 2 were identified as respiratory-related arousals (0 /h), 0 were PLM-related arousals (0 /h), and 100 were non-specific arousals (19 /h).   EEG:  Review of the EEG showed no abnormal electrical discharges and symmetrical bihemispheric findings.     EKG: The EKG revealed normal sinus rhythm (NSR). The average heart rate during sleep was 78 bpm.    AUDIO/VIDEO REVIEW: The audio and video review did not show any abnormal or unusual behaviors, movements, phonations or vocalizations. The patient took no restroom breaks. No audible snoring was noted.    POST-STUDY QUESTIONNAIRE: Post study, the patient indicated, that sleep was better than usual.    IMPRESSION:  1. Dysfunctions associated with sleep stages or arousal from sleep   RECOMMENDATIONS:  1. This study does not demonstrate any significant obstructive or central sleep disordered breathing with an AHI of less than 5/hour - Her total AHI was 0.94/hour - and oxygen desaturation nadir was 92%. No audible snoring was noted. Treatment with a positive airway pressure device, such as CPAP or autoPAP is not indicated.   2. This study shows very little sleep fragmentation, and abnormal sleep stage percentages; these are nonspecific findings and per se do not signify an intrinsic sleep disorder or a cause for the patient's sleep-related symptoms. Causes include (but are not limited to) the first night effect of the sleep study, circadian rhythm disturbances, medication effect or an underlying mood disorder or medical  problem.  3. The patient should be cautioned not to drive, work at heights, or operate dangerous or heavy equipment when tired or sleepy. Review and reiteration of good sleep hygiene measures should be pursued with any patient. 4. The patient will be advised to follow up with the referring provider, who will be notified of the test results.   I certify that I have reviewed the entire raw data recording prior to the issuance of this report in accordance with the Standards of Accreditation of the American Academy of Sleep Medicine (AASM).  Huston Foley, MD, PhD Medical Director, [facility]) Diplomat, ABPN (Neurology and Sleep)               Technical Report:   General Information  Name: Mia Rubio, Mia Rubio BMI: 28.80 Physician: Huston Foley, MD  ID: [id number] Height: 69.0 in Technician: Domingo Cocking, RPSGT  Sex: Female Weight: 195.0 lb Record: [id number]  Age: 42 [[birth year]] Date: 07/16/2022    Medical & Medication History    Ms. Webster is a 42 year old female with an underlying medical history of chronic migraines, POTS, history of hypotension, SVT  with status post ablation in June 2023, dysautonomia, and overweight state, who reports difficulty sleeping at night and and excessive daytime somnolence as well as recurrent headaches, chronic daytime fatigue. Her Epworth sleepiness score is 11 out of 24, fatigue severity score is 63 out of 63. She has been on Ambien for years, this dates back to when she was working a very variable schedule as a Nurse, adult for commercials. She is currently not working. She lives with her family which includes her husband and her son. She has been taking Ambien around 2 AM and recently added CBD Gummies around 3 AM, typically may be asleep before 5 AM, she tries to be in bed around 2 AM. Rise time varies, depending on what she has to do for the day. Generally between noon and 2 PM.  Symbicort, Wellbutrin SR,  Bentyl, Benadryl, Advvil, Atrovent, Toradol, Antivert, Multivitamins, Nurtec, Maxalt, Slynd, Spiriva, Effexor, Ambien   Sleep Disorder      Comments   Patient arrived for a diagnostic polysomnogram. Procedure explained and all questions answered. Standard paste setup without complications. Patient slept supine, left, and right. No significant snoring heard. A few respiratory events observed. No obvious cardiac arrhythmias observed. No significant PLMS observed. No restroom visit.     Lights out: 10:11:51 PM Lights on: 04:51:32 AM   Time Total Supine Side Prone Upright  Recording (TRT) 6h 39.25m 0h 41.64m 5h 58.54m 0h 0.24m 0h 0.33m  Sleep (TST) 5h 18.67m 0h 26.12m 4h 52.94m 0h 0.27m 0h 0.24m   Latency N1 N2 N3 REM Onset Per. Slp. Eff.  Actual 0h 0.30m 0h 9.62m 0h 0.85m 3h 7.30m 0h 16.75m 0h 25.36m 79.72%   Stg Dur Wake N1 N2 N3 REM  Total 81.0 29.0 260.5 0.0 29.0  Supine 15.0 3.0 23.5 0.0 0.0  Side 66.0 26.0 237.0 0.0 29.0  Prone 0.0 0.0 0.0 0.0 0.0  Upright 0.0 0.0 0.0 0.0 0.0   Stg % Wake N1 N2 N3 REM  Total 20.3 9.1 81.8 0.0 9.1  Supine 3.8 0.9 7.4 0.0 0.0  Side 16.5 8.2 74.4 0.0 9.1  Prone 0.0 0.0 0.0 0.0 0.0  Upright 0.0 0.0 0.0 0.0 0.0     Apnea Summary Sub Supine Side Prone Upright  Total 2 Total 2 0 2 0 0    REM 1 0 1 0 0    NREM 1 0 1 0 0  Obs 1 REM 1 0 1 0 0    NREM 0 0 0 0 0  Mix 1 REM 0 0 0 0 0    NREM 1 0 1 0 0  Cen 0 REM 0 0 0 0 0    NREM 0 0 0 0 0   Rera Summary Sub Supine Side Prone Upright  Total 0 Total 0 0 0 0 0    REM 0 0 0 0 0    NREM 0 0 0 0 0   Hypopnea Summary Sub Supine Side Prone Upright  Total 3 Total 3 1 2  0 0    REM 0 0 0 0 0    NREM 3 1 2  0 0   4% Hypopnea Summary Sub Supine Side Prone Upright  Total (4%) 0 Total 0 0 0 0 0    REM 0 0 0 0 0    NREM 0 0 0 0 0     AHI Total Obs Mix Cen  0.94 Apnea 0.38 0.19 0.19 0.00   Hypopnea 0.57 -- -- --  0.38 Hypopnea (4%) 0.00 -- -- --    Total Supine Side Prone Upright  Position AHI 0.94 2.26 0.82 0.00  0.00  REM AHI 2.07   NREM AHI 0.83   Position RDI 0.94 2.26 0.82 0.00 0.00  REM RDI 2.07   NREM RDI 0.83    4% Hypopnea Total Supine Side Prone Upright  Position AHI (4%) 0.38 0.00 0.41 0.00 0.00  REM AHI (4%) 2.07   NREM AHI (4%) 0.21   Position RDI (4%) 0.38 0.00 0.41 0.00 0.00  REM RDI (4%) 2.07   NREM RDI (4%) 0.21    Desaturation Information Threshold: 2% <100% <90% <80% <70% <60% <50% <40%  Supine 14.0 0.0 0.0 0.0 0.0 0.0 0.0  Side 62.0 0.0 0.0 0.0 0.0 0.0 0.0  Prone 0.0 0.0 0.0 0.0 0.0 0.0 0.0  Upright 0.0 0.0 0.0 0.0 0.0 0.0 0.0  Total 76.0 0.0 0.0 0.0 0.0 0.0 0.0  Index 11.9 0.0 0.0 0.0 0.0 0.0 0.0   Threshold: 3% <100% <90% <80% <70% <60% <50% <40%  Supine 1.0 0.0 0.0 0.0 0.0 0.0 0.0  Side 10.0 0.0 0.0 0.0 0.0 0.0 0.0  Prone 0.0 0.0 0.0 0.0 0.0 0.0 0.0  Upright 0.0 0.0 0.0 0.0 0.0 0.0 0.0  Total 11.0 0.0 0.0 0.0 0.0 0.0 0.0  Index 1.7 0.0 0.0 0.0 0.0 0.0 0.0   Threshold: 4% <100% <90% <80% <70% <60% <50% <40%  Supine 0.0 0.0 0.0 0.0 0.0 0.0 0.0  Side 6.0 0.0 0.0 0.0 0.0 0.0 0.0  Prone 0.0 0.0 0.0 0.0 0.0 0.0 0.0  Upright 0.0 0.0 0.0 0.0 0.0 0.0 0.0  Total 6.0 0.0 0.0 0.0 0.0 0.0 0.0  Index 0.9 0.0 0.0 0.0 0.0 0.0 0.0   Threshold: 3% <100% <90% <80% <70% <60% <50% <40%  Supine 1 0 0 0 0 0 0  Side 10 0 0 0 0 0 0  Prone 0 0 0 0 0 0 0  Upright 0 0 0 0 0 0 0  Total 11 0 0 0 0 0 0   Awakening/Arousal Information # of Awakenings 52  Wake after sleep onset 64.14m  Wake after persistent sleep 60.59m   Arousal Assoc. Arousals Index  Apneas 1 0.2  Hypopneas 1 0.2  Leg Movements 6 1.1  Snore 0 0.0  PTT Arousals 0 0.0  Spontaneous 101 19.0  Total 109 20.5  Leg Movement Information PLMS LMs Index  Total LMs during PLMS 0 0.0  LMs w/ Microarousals 0 0.0   LM LMs Index  w/ Microarousal 6 1.1  w/ Awakening 1 0.2  w/ Resp Event 0 0.0  Spontaneous 10 1.9  Total 16 3.0     Desaturation threshold setting: 3% Minimum desaturation setting: 10 seconds SaO2  nadir: 50% The longest event was a 18 sec obstructive Apnea with a minimum SaO2 of 94%. The lowest SaO2 was 92% associated with a 11 sec obstructive Hypopnea. EKG Rates EKG Avg Max Min  Awake 82 112 66  Asleep 78 93 67  EKG Events: Tachycardia

## 2022-07-18 ENCOUNTER — Telehealth: Payer: Self-pay | Admitting: *Deleted

## 2022-07-18 ENCOUNTER — Telehealth: Payer: Self-pay | Admitting: Neurology

## 2022-07-18 NOTE — Telephone Encounter (Signed)
-----   Message from Huston Foley, MD sent at 07/17/2022  1:10 PM EDT ----- Patient referred by Dr. Lucia Gaskins, seen by me on 04/04/22, patient had a diagnostic PSG on 07/16/22.   Please call and notify the patient that the recent sleep study did not show any significant sleep apnea or significant underlying sleep disorder, no significant EKG (heart tracing) changes or EEG (brain wave tracing) changes. Her AHI was 0.9/hour - and oxygen saturations remained at or above 92% for the night. No significant snoring was noted. Treatment with a positive airway pressure device, such as CPAP or autoPAP is not indicated. No organic reason for sleep difficulty was found on this test. Patient did sleep almost 80% of the time tested, and achieved mostly light stage sleep, which is often a medication effect, no deep sleep and reduced dream/REM sleep. These are non-specific findings. At this juncture, I recommend, she follow up with Dr. Lucia Gaskins as planned/scheduled and her PCP and other providers.   Thanks,  Huston Foley, MD, PhD [facility])

## 2022-07-18 NOTE — Telephone Encounter (Signed)
Gave pt sleep study results  Gave Dr Frances Furbish recommendations  pt expressed understanding and thanked me for calling

## 2022-07-18 NOTE — Telephone Encounter (Signed)
See phone note

## 2022-07-18 NOTE — Telephone Encounter (Signed)
Pt called for her sleep results. Please call back when available.

## 2022-07-30 ENCOUNTER — Other Ambulatory Visit: Payer: Self-pay | Admitting: Neurology

## 2022-08-01 NOTE — Telephone Encounter (Signed)
Please see the MyChart message reply(ies) for my assessment and plan.    This patient gave consent for this Medical Advice Message and is aware that it may result in a bill to their insurance company, as well as the possibility of receiving a bill for a co-payment or deductible. They are an established patient, but are not seeking medical advice exclusively about a problem treated during an in person or video visit in the last seven days. I did not recommend an in person or video visit within seven days of my reply.    I spent a total of 30 minutes cumulative time within 7 days through MyChart messaging.  Keylen Uzelac B, MD   

## 2022-08-06 ENCOUNTER — Other Ambulatory Visit: Payer: Self-pay | Admitting: Neurology

## 2022-08-19 ENCOUNTER — Other Ambulatory Visit: Payer: Self-pay | Admitting: Internal Medicine

## 2022-08-22 ENCOUNTER — Ambulatory Visit (INDEPENDENT_AMBULATORY_CARE_PROVIDER_SITE_OTHER): Payer: Medicaid Other | Admitting: Neurology

## 2022-08-22 DIAGNOSIS — G43709 Chronic migraine without aura, not intractable, without status migrainosus: Secondary | ICD-10-CM

## 2022-08-22 MED ORDER — ONABOTULINUMTOXINA 200 UNITS IJ SOLR
155.0000 [IU] | Freq: Once | INTRAMUSCULAR | Status: AC
  Administered 2022-08-22: 155 [IU] via INTRAMUSCULAR

## 2022-08-22 NOTE — Progress Notes (Signed)
08/22/2022: At baseline had migraines daily. She gets >50% relief in migraine frequency. But still have 8 migraine days a month and 15 headache days a month > 3 months despite her great improvement with botox. Will try emgality.   05/29/2022: stable  03/05/2022: stable doing great  11/30/2021: Prior to botox daily headaches and most of the month daily migraines maybe even daily. Botox has changed her life. She has a lot of light sensitivity. But even the light sensitivity is better, she wears FL-41. After botox has 6 evere migraine days a month and < 14 total headache days a month significant >>50% improvement on botox in frequ and severity. Failed ubrelvy, imitrex, rizatriptan, zomig. Can try ubrelvy if nurtec not approved. Affects her ability to drive, light sensitivity affects her ability to go outside, filled out a car tinting form, still has 6 -7 severe migraine days a month and headaches, has POTS with fatigue and exercise instability.  She cannot work 8 hours straight without restm migraines exacerbated by fatigue, lights, exertion, significantly impacting her life despite improvement by botox. Higher incidence of POTS with migraines.   09/07/2021: Botox is working well.  06/14/2021: Reports botox working well. Two weeks late for botox so she has had more migraines. 1/ week migraine when taking botox consistently. Never severe.    BOTOX PROCEDURE NOTE FOR MIGRAINE HEADACHE    Contraindications and precautions discussed with patient(above). Aseptic procedure was observed and patient tolerated procedure. Procedure performed by Butch Penny, NP  The condition has existed for more than 6 months, and pt does not have a diagnosis of ALS, Myasthenia Gravis or Lambert-Eaton Syndrome.  Risks and benefits of injections discussed and pt agrees to proceed with the procedure.  Written consent obtained  These injections are medically necessary. These injections do not cause sedations or hallucinations  which the oral therapies may cause.  Indication/Diagnosis: chronic migraine  Description of procedure:  The patient was placed in a sitting position. The standard protocol was used for Botox as follows, with 5 units of Botox injected at each site:   -Procerus muscle, midline injection  -Corrugator muscle, bilateral injection  -Frontalis muscle, bilateral injection, with 2 sites each side, medial injection was performed in the upper one third of the frontalis muscle, in the region vertical from the medial inferior edge of the superior orbital rim. The lateral injection was again in the upper one third of the forehead vertically above the lateral limbus of the cornea, 1.5 cm lateral to the medial injection site.  -Temporalis muscle injection, 4 sites, bilaterally. The first injection was 3 cm above the tragus of the ear, second injection site was 1.5 cm to 3 cm up from the first injection site in line with the tragus of the ear. The third injection site was 1.5-3 cm forward between the first 2 injection sites. The fourth injection site was 1.5 cm posterior to the second injection site.  -Occipitalis muscle injection, 3 sites, bilaterally. The first injection was done one half way between the occipital protuberance and the tip of the mastoid process behind the ear. The second injection site was done lateral and superior to the first, 1 fingerbreadth from the first injection. The third injection site was 1 fingerbreadth superiorly and medially from the first injection site.  -Cervical paraspinal muscle injection, 2 sites, bilateral knee first injection site was 1 cm from the midline of the cervical spine, 3 cm inferior to the lower border of the occipital protuberance. The second  injection site was 1.5 cm superiorly and laterally to the first injection site.  -Trapezius muscle injection was performed at 3 sites, bilaterally. The first injection site was in the upper trapezius muscle halfway between  the inflection point of the neck, and the acromion. The second injection site was one half way between the acromion and the first injection site. The third injection was done between the first injection site and the inflection point of the neck.   Will return for repeat injection in 3 months.   A 200 units of Botox was used, 155 units were injected, 45Uof the Botox was wasted. The patient tolerated the procedure well, there were no complications of the above procedure.  Naomie Dean, MD [facility] [address], [address] [place], [place] [address] [phone]

## 2022-08-22 NOTE — Progress Notes (Signed)
Botox- 200 units x 1 vial Lot: [id number] Expiration: 12/2024 NDC: 1610-9604-54  Bacteriostatic 0.9% Sodium Chloride- 4 mL  Lot: [id number] Expiration: 05/28/2023 NDC: 1478-2956-21  Dx: H08.657 S/P  Witnessed by S. [facility] CMA

## 2022-08-27 MED ORDER — EMGALITY 120 MG/ML ~~LOC~~ SOAJ: 120.0000 mg | SUBCUTANEOUS | 11 refills | Status: DC

## 2022-09-05 ENCOUNTER — Other Ambulatory Visit (HOSPITAL_COMMUNITY): Payer: Self-pay

## 2022-09-05 ENCOUNTER — Telehealth: Payer: Self-pay

## 2022-09-05 NOTE — Telephone Encounter (Signed)
Pharmacy Patient Advocate Encounter   Received notification from [facility] that prior authorization for Emgality 120MG /ML auto-injectors (migraine)  is required/requested.   PA submitted to [facility] via CoverMyMeds Key or (Medicaid) confirmation # [id number] Status is pending

## 2022-09-07 ENCOUNTER — Other Ambulatory Visit (HOSPITAL_COMMUNITY): Payer: Self-pay

## 2022-09-07 NOTE — Telephone Encounter (Signed)
Pharmacy Patient Advocate Encounter  Received notification from [facility] that Prior Authorization for Emgality 120MG /ML auto-injectors (migraine) has been APPROVED from 09/06/2022 to 12/06/2022.Marland Kitchen  PA #/Case ID/Reference #:  [id number]   Copay is $4.00 per [facility] test claim.

## 2022-09-08 ENCOUNTER — Other Ambulatory Visit: Payer: Self-pay | Admitting: Neurology

## 2022-09-12 ENCOUNTER — Other Ambulatory Visit: Payer: Self-pay | Admitting: Neurology

## 2022-09-13 ENCOUNTER — Encounter: Payer: Self-pay | Admitting: Internal Medicine

## 2022-09-13 ENCOUNTER — Telehealth: Payer: Self-pay | Admitting: Internal Medicine

## 2022-09-13 NOTE — Telephone Encounter (Signed)
Paper Work Dropped Off: PT dropped off application for meds    Date:09/13/2022  Location of paper:

## 2022-09-13 NOTE — Telephone Encounter (Signed)
Pt's patient assistance application was scanned to [place], LPN, email. FYI

## 2022-09-17 NOTE — Telephone Encounter (Signed)
Signed patient assistance form faxed to [employer] [phone].

## 2022-09-17 NOTE — Telephone Encounter (Signed)
**Note De-Identified Affie Gasner Obfuscation** I have completed the providers page of the pts [employer] application for Corlanor assistance and have e-mailed all to our DOD, Dr Gibson Ramp nurse so she can obtain his signature in Dr Odessa Fleming absence, to date it, and so she can then fax all the [employer] at the fax number written on the cover letter included.

## 2022-09-19 NOTE — Telephone Encounter (Signed)
**Note De-Identified Damain Broadus Obfuscation** I checked the pts application that was e-mailed to me on 7/18 and found that the pt did not include the 1st page of her [employer] pt asst application for Corlanor assistance.  I called the pt to discuss but got no answer so I left a message on her VM asking her to call Larita Fife back at Dr Odessa Fleming office at [phone].

## 2022-09-19 NOTE — Telephone Encounter (Signed)
**Note De-Identified Mia Rubio Obfuscation** The pt checked her copy of her [employer] application and realized that she still has page 1 of it. She states that her husband will be bring it to the office to drop off so I can fax it to [employer].  She thanked me for my call and assistance.

## 2022-09-19 NOTE — Telephone Encounter (Signed)
Caller stated they are missing the first page of patient's application and will need that informat re-sent to fax# [phone].  Reference case# [id number].

## 2022-09-19 NOTE — Telephone Encounter (Signed)
Patient is returning call. Transferred to [employer], [place].

## 2022-09-20 ENCOUNTER — Telehealth: Payer: Self-pay | Admitting: Internal Medicine

## 2022-09-20 NOTE — Telephone Encounter (Signed)
**Note De-Identified Avaline Stillson Obfuscation** Please see phone note in chart from 7/18 for update.

## 2022-09-20 NOTE — Telephone Encounter (Signed)
Paper Work Dropped Of: APPLICATION FOR MEDS   Date: 09/20/2022  Location of paper: IN FOLDER

## 2022-09-20 NOTE — Telephone Encounter (Signed)
Pt's patient assistance application was scanned to [place], LPN email. FYI 

## 2022-09-20 NOTE — Telephone Encounter (Signed)
**Note De-Identified Montray Kliebert Obfuscation** The 1st page of the pts [employer] pt asst application was left at the office and I have faxed it to [employer] at fax#: [phone]. I did write Reference case# [id number] on the cover letter as requested. Fax: Tx 'ok' Report CONE_EMAIL-to-Fax  Yanette Tripoli, Elease Hashimoto    This message was sent [facility], a product from [employer]. http://www.biscom.com/  [employer], a [employer], pioneered [employer] and continues to innovate the most advanced and intelligent fax and [employer] for enterprises. www.biscom.com                   -------Fax Transmission Report------- To:               Recipient at [phone] Subject:          Fw: Corlanor Assistance Result:           The transmission was successful. Explanation:      All Pages Ok Pages Sent:       3 Connect Time:     2 minutes, 15 seconds Transmit Time:    09/20/2022 08:42 Transfer Rate:    14400 Status Code:      0000 Retry Count:      0 Job Id:           [id number] Unique Id:        [id number] Fax Line:         38 Fax Server:       [id number]

## 2022-09-25 ENCOUNTER — Encounter: Payer: Self-pay | Admitting: Internal Medicine

## 2022-09-27 ENCOUNTER — Ambulatory Visit: Payer: Medicaid Other | Admitting: Internal Medicine

## 2022-09-27 DIAGNOSIS — I471 Supraventricular tachycardia, unspecified: Secondary | ICD-10-CM

## 2022-09-27 DIAGNOSIS — G90A Postural orthostatic tachycardia syndrome (POTS): Secondary | ICD-10-CM

## 2022-09-28 ENCOUNTER — Other Ambulatory Visit: Payer: Self-pay | Admitting: Pharmacist

## 2022-09-28 ENCOUNTER — Encounter: Payer: Self-pay | Admitting: Pharmacist

## 2022-09-28 ENCOUNTER — Other Ambulatory Visit (HOSPITAL_COMMUNITY): Payer: Self-pay

## 2022-09-28 MED ORDER — IVABRADINE HCL 5 MG PO TABS: 5.0000 mg | ORAL_TABLET | Freq: Two times a day (BID) | ORAL | 3 refills | Status: DC

## 2022-10-13 ENCOUNTER — Other Ambulatory Visit: Payer: Self-pay | Admitting: Neurology

## 2022-10-13 DIAGNOSIS — G43711 Chronic migraine without aura, intractable, with status migrainosus: Secondary | ICD-10-CM

## 2022-10-13 DIAGNOSIS — G43109 Migraine with aura, not intractable, without status migrainosus: Secondary | ICD-10-CM

## 2022-10-18 MED ORDER — KETOROLAC TROMETHAMINE 60 MG/2ML IM SOLN
INTRAMUSCULAR | 0 refills | Status: DC
  Filled 2022-10-18: qty 10, 3d supply, fill #0

## 2022-10-18 MED ORDER — "BD LUER-LOK SYRINGE 25G X 1"" 3 ML MISC"
0 refills | Status: DC
  Filled 2022-10-18: qty 5, 3d supply, fill #0

## 2022-10-19 ENCOUNTER — Other Ambulatory Visit (HOSPITAL_COMMUNITY): Payer: Self-pay

## 2022-10-19 ENCOUNTER — Other Ambulatory Visit: Payer: Self-pay

## 2022-10-25 ENCOUNTER — Telehealth: Payer: Self-pay | Admitting: Neurology

## 2022-10-25 NOTE — Telephone Encounter (Signed)
[facility]  order details scheduled delivery date: 10-31-22 [facility] address verified : 200  units 1 vial, supply 34 day

## 2022-10-25 NOTE — Telephone Encounter (Signed)
I updated the appt note.

## 2022-10-30 ENCOUNTER — Other Ambulatory Visit: Payer: Self-pay | Admitting: Medical Genetics

## 2022-10-30 DIAGNOSIS — Z006 Encounter for examination for normal comparison and control in clinical research program: Secondary | ICD-10-CM

## 2022-11-08 DIAGNOSIS — Z0271 Encounter for disability determination: Secondary | ICD-10-CM

## 2022-11-14 ENCOUNTER — Encounter: Payer: Self-pay | Admitting: Neurology

## 2022-11-14 ENCOUNTER — Ambulatory Visit: Payer: Medicaid Other | Admitting: Neurology

## 2022-11-14 DIAGNOSIS — G43709 Chronic migraine without aura, not intractable, without status migrainosus: Secondary | ICD-10-CM

## 2022-11-14 MED ORDER — VENLAFAXINE HCL ER 225 MG PO TB24: 225.0000 mg | ORAL_TABLET | Freq: Every day | ORAL | 11 refills | Status: DC

## 2022-11-14 MED ORDER — ONABOTULINUMTOXINA 200 UNITS IJ SOLR
155.0000 [IU] | Freq: Once | INTRAMUSCULAR | Status: AC
  Administered 2022-11-14: 155 [IU] via INTRAMUSCULAR

## 2022-11-14 NOTE — Patient Instructions (Addendum)
Continue emgality Lamictal is great for migraine with aura. Also is magnesium has good evidence for migraine with aura(mag citrate) Try Vyepti and/or Qulipta. Try qulipta for a few weeks can give samples once daily.  Can try to get vyepti approved  Lamotrigine Tablets What is this medication? LAMOTRIGINE (la MOE Patrecia Pace) prevents and controls seizures in people with epilepsy. It may also be used to treat bipolar disorder. It works by calming overactive nerves in your body. This medicine may be used for other purposes; ask your health care provider or pharmacist if you have questions. COMMON BRAND NAME(S): Lamictal, Subvenite What should I tell my care team before I take this medication? They need to know if you have any of these conditions: Heart disease History of irregular heartbeat Immune system problems Kidney disease Liver disease Low levels of folic acid in the blood Lupus Mental health condition Suicidal thoughts, plans, or attempt by you or a family member An unusual or allergic reaction to lamotrigine, other medications, foods, dyes, or preservatives Pregnant or trying to get pregnant Breastfeeding How should I use this medication? Take this medication by mouth with a glass of water. Follow the directions on the prescription label. Do not chew these tablets. If this medication upsets your stomach, take it with food or milk. Take your doses at regular intervals. Do not take your medication more often than directed. A special MedGuide will be given to you by the pharmacist with each new prescription and refill. Be sure to read this information carefully each time. Talk to your care team about the use of this medication in children. While this medication may be prescribed for children as young as 2 years for selected conditions, precautions do apply. Overdosage: If you think you have taken too much of this medicine contact a poison control center or emergency room at once. NOTE:  This medicine is only for you. Do not share this medicine with others. What if I miss a dose? If you miss a dose, take it as soon as you can. If it is almost time for your next dose, take only that dose. Do not take double or extra doses. What may interact with this medication? Atazanavir Certain medications for irregular heartbeat Certain medications for seizures, such as carbamazepine, phenobarbital, phenytoin, primidone, or valproic acid Estrogen or progestin hormones Lopinavir Rifampin Ritonavir This list may not describe all possible interactions. Give your health care provider a list of all the medicines, herbs, non-prescription drugs, or dietary supplements you use. Also tell them if you smoke, drink alcohol, or use illegal drugs. Some items may interact with your medicine. What should I watch for while using this medication? Visit your care team for regular checks on your progress. If you take this medication for seizures, wear a Medic Alert bracelet or necklace. Carry an identification card with information about your condition, medications, and care team. It is important to take this medication exactly as directed. When first starting treatment, your dose will need to be adjusted slowly. It may take weeks or months before your dose is stable. You should contact your care team if your seizures get worse or if you have any new types of seizures. Do not stop taking this medication unless instructed by your care team. Stopping your medication suddenly can increase your seizures or their severity. This medication may cause serious skin reactions. They can happen weeks to months after starting the medication. Contact your care team right away if you notice fevers or flu-like  symptoms with a rash. The rash may be red or purple and then turn into blisters or peeling of the skin. You may also notice a red rash with swelling of the face, lips, or lymph nodes in your neck or under your arms. This  medication may affect your coordination, reaction time, or judgment. Do not drive or operate machinery until you know how this medication affects you. Sit up or stand slowly to reduce the risk of dizzy or fainting spells. Drinking alcohol with this medication can increase the risk of these side effects. If you are taking this medication for bipolar disorder, it is important to report any changes in your mood to your care team. If your condition gets worse, you get mentally depressed, feel very hyperactive or manic, have difficulty sleeping, or have thoughts of hurting yourself or committing suicide, you need to get help from your care team right away. If you are a caregiver for someone taking this medication for bipolar disorder, you should also report these behavioral changes right away. The use of this medication may increase the chance of suicidal thoughts or actions. Pay special attention to how you are responding while on this medication. Your mouth may get dry. Chewing sugarless gum or sucking hard candy and drinking plenty of water may help. Contact your care team if the problem does not go away or is severe. If you become pregnant while using this medication, you may enroll in the [place] [employer] Antiepileptic Drug Pregnancy Registry by calling [phone]. This registry collects information about the safety of antiepileptic medication use during pregnancy. This medication may cause a decrease in folic acid. You should make sure that you get enough folic acid while you are taking this medication. Discuss the foods you eat and the vitamins you take with your care team. What side effects may I notice from receiving this medication? Side effects that you should report to your care team as soon as possible: Allergic reactions--skin rash, itching, hives, swelling of the face, lips, tongue, or throat Change in vision Fever, neck pain or stiffness, sensitivity to light, headache, nausea, vomiting,  confusion Heart rhythm changes--fast or irregular heartbeat, dizziness, feeling faint or lightheaded, chest pain, trouble breathing Infection--fever, chills, cough, or sore throat Liver injury--right upper belly pain, loss of appetite, nausea, light-colored stool, dark yellow or brown urine, yellowing skin or eyes, unusual weakness or fatigue Low red blood cell count--unusual weakness or fatigue, dizziness, headache, trouble breathing Rash, fever, and swollen lymph nodes Redness, blistering, peeling or loosening of the skin, including inside the mouth Thoughts of suicide or self-harm, worsening mood, or feelings of depression Unusual bruising or bleeding Side effects that usually do not require medical attention (report to your care team if they continue or are bothersome): Diarrhea Dizziness Drowsiness Headache Nausea Stomach pain Tremors or shaking This list may not describe all possible side effects. Call your doctor for medical advice about side effects. You may report side effects to FDA at 1-800-FDA-1088. Where should I keep my medication? Keep out of the reach of children and pets. Store at [employer] C (77 degrees F). Protect from light. Get rid of any unused medication after the expiration date. To get rid of medications that are no longer needed or have expired: Take the medication to a medication take-back program. Check with your pharmacy or law enforcement to find a location. If you cannot return the medication, check the label or package insert to see if the medication should be thrown out  in the garbage or flushed down the toilet. If you are not sure, ask your care team. If it is safe to put it in the trash, empty the medication out of the container. Mix the medication with cat litter, dirt, coffee grounds, or other unwanted substance. Seal the mixture in a bag or container. Put it in the trash. NOTE: This sheet is a summary. It may not cover all possible information. If you  have questions about this medicine, talk to your doctor, pharmacist, or health care provider.  2024 Elsevier/Gold Standard (2021-08-22 00:00:00) Venlafaxine Extended-Release Capsules What is this medication? VENLAFAXINE (VEN la fax een) treats depression and anxiety. It increases the amount of serotonin and norepinephrine in the brain, hormones that help regulate mood. It belongs to a group of medications called SNRIs. This medicine may be used for other purposes; ask your health care provider or pharmacist if you have questions. COMMON BRAND NAME(S): Effexor XR What should I tell my care team before I take this medication? They need to know if you have any of these conditions: Bleeding disorders Glaucoma Heart disease High blood pressure High cholesterol Kidney disease Liver disease Low levels of sodium in the blood Mania or bipolar disorder Seizures Suicidal thoughts, plans, or attempt by you or a family member Take medications that treat or prevent blood clots Thyroid disease An unusual or allergic reaction to venlafaxine, other medications, foods, dyes, or preservatives Pregnant or trying to get pregnant Breastfeeding How should I use this medication? Take this medication by mouth with a full glass of water. Take it as directed on the prescription label. Do not cut, crush, or chew this medication. Take it with food. You may open the capsule and put the contents in 1 teaspoon of applesauce. Swallow the medication and applesauce right away. Do not chew the medication or applesauce. Follow with a glass of water to ensure complete swallowing of the pellets. Try to take your medication at about the same time each day. Do not take your medication more often than directed. Keep taking this medication unless your care team tells you to stop. Stopping it too quickly can cause serious side effects. It can also make your condition worse. A special MedGuide will be given to you by the pharmacist  with each prescription and refill. Be sure to read this information carefully each time. Talk to your care team about the use of this medication in children. Special care may be needed. Overdosage: If you think you have taken too much of this medicine contact a poison control center or emergency room at once. NOTE: This medicine is only for you. Do not share this medicine with others. What if I miss a dose? If you miss a dose, take it as soon as you can. If it is almost time for your next dose, take only that dose. Do not take double or extra doses. What may interact with this medication? Do not take this medication with any of the following: Alcohol Certain medications for fungal infections, such as fluconazole, itraconazole, ketoconazole, posaconazole, voriconazole Cisapride Desvenlafaxine Dronedarone Duloxetine Levomilnacipran Linezolid MAOIs, such as Carbex, Eldepryl, Marplan, Nardil, and Parnate Methylene blue (injected into a vein) Milnacipran Pimozide Thioridazine This medication may also interact with the following: Amphetamines Aspirin and aspirin-like medications Certain medications for mental health conditions Certain medications for migraine headaches, such as almotriptan, eletriptan, frovatriptan, naratriptan, rizatriptan, sumatriptan, zolmitriptan Certain medications for sleep Certain medications that treat or prevent blood clots, such as dalteparin, enoxaparin, warfarin Cimetidine  Clozapine Diuretics Fentanyl Furazolidone Indinavir Isoniazid Lithium Metoprolol NSAIDS, medications for pain and inflammation, such as ibuprofen or naproxen Other medications that cause heart rhythm changes Procarbazine Rasagiline Supplements, such as St. John's wort, kava kava, valerian Tramadol Tryptophan This list may not describe all possible interactions. Give your health care provider a list of all the medicines, herbs, non-prescription drugs, or dietary supplements you use.  Also tell them if you smoke, drink alcohol, or use illegal drugs. Some items may interact with your medicine. What should I watch for while using this medication? Tell your care team if your symptoms do not get better or if they get worse. Visit your care team for regular checks on your progress. Because it may take several weeks to see the full effects of this medication, it is important to continue your treatment as prescribed by your care team. Watch for new or worsening thoughts of suicide or depression. This includes sudden changes in mood, behaviors, or thoughts. These changes can happen at any time but are more common in the beginning of treatment or after a change in dose. Call your care team right away if you experience these thoughts or worsening depression. This medication may cause mood and behavior changes, such as anxiety, nervousness, irritability, hostility, restlessness, excitability, hyperactivity, or trouble sleeping. These changes can happen at any time but are more common in the beginning of treatment or after a change in dose. Call your care team right away if you notice any of these symptoms. This medication can cause an increase in blood pressure. Check with your care team for instructions on monitoring your blood pressure while taking this medication. This medication may affect your coordination, reaction time, or judgment. Do not drive or operate machinery until you know how this medication affects you. Sit up or stand slowly to reduce the risk of dizzy or fainting spells. Drinking alcohol with this medication can increase the risk of these side effects. Your mouth may get dry. Chewing sugarless gum or sucking hard candy and drinking plenty of water may help. Contact your care team if the problem does not go away or is severe. What side effects may I notice from receiving this medication? Side effects that you should report to your care team as soon as possible: Allergic  reactions--skin rash, itching, hives, swelling of the face, lips, tongue, or throat Bleeding--bloody or black, tar-like stools, red or dark brown urine, vomiting blood or brown material that looks like coffee grounds, small, red or purple spots on skin, unusual bleeding or bruising Heart rhythm changes--fast or irregular heartbeat, dizziness, feeling faint or lightheaded, chest pain, trouble breathing Increase in blood pressure Loss of appetite with weight loss Low sodium level--muscle weakness, fatigue, dizziness, headache, confusion Serotonin syndrome--irritability, confusion, fast or irregular heartbeat, muscle stiffness, twitching muscles, sweating, high fever, seizures, chills, vomiting, diarrhea Sudden eye pain or change in vision such as blurry vision, seeing halos around lights, vision loss Thoughts of suicide or self-harm, worsening mood, feelings of depression Side effects that usually do not require medical attention (report to your care team if they continue or are bothersome): Anxiety, nervousness Change in sex drive or performance Dizziness Dry mouth Excessive sweating Nausea Tremors or shaking Trouble sleeping This list may not describe all possible side effects. Call your doctor for medical advice about side effects. You may report side effects to FDA at 1-800-FDA-1088. Where should I keep my medication? Keep out of the reach of children and pets. Store at a controlled temperature  between 20 and 25 degrees C (68 degrees and 77 degrees F), in a dry place. Throw away any unused medication after the expiration date. NOTE: This sheet is a summary. It may not cover all possible information. If you have questions about this medicine, talk to your doctor, pharmacist, or health care provider.  2024 Elsevier/Gold Standard (2022-02-08 00:00:00) Eptinezumab Injection What is this medication? EPTINEZUMAB (EP ti NEZ ue mab) prevents migraines. It works by blocking a substance in the  body that causes migraines. It is a monoclonal antibody. This medicine may be used for other purposes; ask your health care provider or pharmacist if you have questions. COMMON BRAND NAME(S): Vyepti What should I tell my care team before I take this medication? They need to know if you have any of these conditions: An unusual or allergic reaction to eptinezumab, other medications, foods, dyes, or preservatives Pregnant or trying to get pregnant Breast-feeding How should I use this medication? This medication is injected into a vein. It is given by your care team in a hospital or clinic setting. Talk to your care team about the use of this medication in children. Special care may be needed. Overdosage: If you think you have taken too much of this medicine contact a poison control center or emergency room at once. NOTE: This medicine is only for you. Do not share this medicine with others. What if I miss a dose? Keep appointments for follow-up doses. It is important not to miss your dose. Call your care team if you are unable to keep an appointment. What may interact with this medication? Interactions are not expected. This list may not describe all possible interactions. Give your health care provider a list of all the medicines, herbs, non-prescription drugs, or dietary supplements you use. Also tell them if you smoke, drink alcohol, or use illegal drugs. Some items may interact with your medicine. What should I watch for while using this medication? Your condition will be monitored carefully while you are receiving this medication. What side effects may I notice from receiving this medication? Side effects that you should report to your care team as soon as possible: Allergic reactions or angioedema--skin rash, itching or hives, swelling of the face, eyes, lips, tongue, arms, or legs, trouble swallowing or breathing Side effects that usually do not require medical attention (report to your  care team if they continue or are bothersome): Runny or stuffy nose This list may not describe all possible side effects. Call your doctor for medical advice about side effects. You may report side effects to FDA at 1-800-FDA-1088. Where should I keep my medication? This medication is given in a hospital or clinic. It will not be stored at home. NOTE: This sheet is a summary. It may not cover all possible information. If you have questions about this medicine, talk to your doctor, pharmacist, or health care provider.  2024 Elsevier/Gold Standard (2021-04-10 00:00:00) Atogepant Tablets What is this medication? ATOGEPANT (a TOE je pant) prevents migraines. It works by blocking a substance in the body that causes migraines. This medicine may be used for other purposes; ask your health care provider or pharmacist if you have questions. COMMON BRAND NAME(S): QULIPTA What should I tell my care team before I take this medication? They need to know if you have any of these conditions: Kidney disease Liver disease An unusual or allergic reaction to atogepant, other medications, foods, dyes, or preservatives Pregnant or trying to get pregnant Breast-feeding How should I use  this medication? Take this medication by mouth with water. Take it as directed on the prescription label at the same time every day. You can take it with or without food. If it upsets your stomach, take it with food. Keep taking it unless your care team tells you to stop. Talk to your care team about the use of this medication in children. Special care may be needed. Overdosage: If you think you have taken too much of this medicine contact a poison control center or emergency room at once. NOTE: This medicine is only for you. Do not share this medicine with others. What if I miss a dose? If you miss a dose, take it as soon as you can. If it is almost time for your next dose, take only that dose. Do not take double or extra  doses. What may interact with this medication? Carbamazepine Certain medications for fungal infections, such as itraconazole, ketoconazole Clarithromycin Cyclosporine Efavirenz Etravirine Phenytoin Rifampin St. John's wort This list may not describe all possible interactions. Give your health care provider a list of all the medicines, herbs, non-prescription drugs, or dietary supplements you use. Also tell them if you smoke, drink alcohol, or use illegal drugs. Some items may interact with your medicine. What should I watch for while using this medication? Visit your care team for regular checks on your progress. Tell your care team if your symptoms do not start to get better or if they get worse. What side effects may I notice from receiving this medication? Side effects that you should report to your care team as soon as possible: Allergic reactions--skin rash, itching, hives, swelling of the face, lips, tongue, or throat Side effects that usually do not require medical attention (report to your care team if they continue or are bothersome): Constipation Fatigue Loss of appetite with weight loss Nausea This list may not describe all possible side effects. Call your doctor for medical advice about side effects. You may report side effects to FDA at 1-800-FDA-1088. Where should I keep my medication? Keep out of the reach of children and pets. Store at room temperature between 20 and 25 degrees C (68 and 77 degrees F). Get rid of any unused medication after the expiration date. To get rid of medications that are no longer needed or have expired: Take the medication to a medication take-back program. Check with your pharmacy or law enforcement to find a location. If you cannot return the medication, check the label or package insert to see if the medication should be thrown out in the garbage or flushed down the toilet. If you are not sure, ask your care team. If it is safe to put it in the  trash, take the medication out of the container. Mix the medication with cat litter, dirt, coffee grounds, or other unwanted substance. Seal the mixture in a bag or container. Put it in the trash. NOTE: This sheet is a summary. It may not cover all possible information. If you have questions about this medicine, talk to your doctor, pharmacist, or health care provider.  2024 Elsevier/Gold Standard (2021-04-03 00:00:00)

## 2022-11-14 NOTE — Progress Notes (Signed)
Botox- 200 units x 1 vial Lot: [id number] Expiration: 01/2025 NDC: 5409-8119-14  Bacteriostatic 0.9% Sodium Chloride- * mL  Lot: [id number] Expiration:10/28/2023 NDC: 2130-8657-84  Dx:G43.709 S/P Witnessed by Victorino December

## 2022-11-14 NOTE — Progress Notes (Signed)
11/14/2022: discussed allodynia in migraines she is stable but has an aura often and allodynia discussed options including  changing emgality to qulipta(gave samples she will qulipta along with emgality and see if she improves then possibly consider discontinuing Emgality). Gave Samples: Start Qulipta for 3 weeks and let me know how you are doing Can think about changing Emgality to Vyepti she has > 50% improvement but still has 15 total headache days and 8 migraines a month without aura, no medication overuse or aura with these She does have concomitant migraines with aura without headaches. Discussed the role of Magnesium in migraine with aura. Discussed trying Lamictal which is good with migraine aura but not headache but she suffers from migraine aura alone almost every day Can continue emgality and try qulipta but will try and get vyepti approved and think about trying magnesium and lamictal for migraine   Sent to patient:  11/14/2022: discussed allodynia in migraines she is stable but has an aura often and allodynia discussed options including  changing emgality to qulipta(gave samples you may take qulipta daily along with emgality and see if you improve then possibly consider discontinuing Emgality can give me an update). Gave Samples: Start Qulipta for 3 weeks and let me know how you are doing Can think about changing Emgality to [facility], will try and get Vyepti approved and if so can discuss further how to incorporate this and what we may chose to stop Also Discussed the role of Magnesium in migraine with aura. Discussed trying Lamictal which is good with migraine aura but not headache but she suffers from migraine aura alone almost every day 5. Increase venlafaxine 6. Continue nurtec once dailyas needed  Magnesium doses depending on which one you take(pick one) and other OTC medications you can try: 200 mg mag citrate 1-2x daily OR 400 mg mag oxide 1-2x daily OR Mag sulfate 400 to 600 mg  daily(can   Other optional vitamin supplementation that may work includes 1000 international units AND vitamin D3 daily AND 500 to 1000 mg of B12 daily AND Riboflavin 400 mg a day AND 150 to 300 mg co-Q10 daily Meds ordered this encounter  Medications   botulinum toxin Type A (BOTOX) injection 155 Units    LOT:[id number] EXP:01/2025 NDC:0023-3921-02 S/P   Venlafaxine HCl 225 MG TB24    Sig: Take 1 tablet (225 mg total) by mouth daily.    Dispense:  30 tablet    Refill:  11      08/22/2022: At baseline had migraines daily. She gets >50% relief in migraine frequency. But still have 8 migraine days a month and 15 headache days a month > 3 months despite her great improvement with botox. Will try emgality.   05/29/2022: stable  03/05/2022: stable doing great  11/30/2021: Prior to botox daily headaches and most of the month daily migraines maybe even daily. Botox has changed her life. She has a lot of light sensitivity. But even the light sensitivity is better, she wears FL-41. After botox has 6 evere migraine days a month and < 14 total headache days a month significant >>50% improvement on botox in frequ and severity. Failed ubrelvy, imitrex, rizatriptan, zomig. Can try ubrelvy if nurtec not approved. Affects her ability to drive, light sensitivity affects her ability to go outside, filled out a car tinting form, still has 6 -7 severe migraine days a month and headaches, has POTS with fatigue and exercise instability.  She cannot work 8 hours straight without restm migraines  exacerbated by fatigue, lights, exertion, significantly impacting her life despite improvement by botox. Higher incidence of POTS with migraines.   09/07/2021: Botox is working well.  06/14/2021: Reports botox working well. Two weeks late for botox so she has had more migraines. 1/ week migraine when taking botox consistently. Never severe.    BOTOX PROCEDURE NOTE FOR MIGRAINE HEADACHE    Contraindications and  precautions discussed with patient(above). Aseptic procedure was observed and patient tolerated procedure. Procedure performed by Butch Penny, NP  The condition has existed for more than 6 months, and pt does not have a diagnosis of ALS, Myasthenia Gravis or Lambert-Eaton Syndrome.  Risks and benefits of injections discussed and pt agrees to proceed with the procedure.  Written consent obtained  These injections are medically necessary. These injections do not cause sedations or hallucinations which the oral therapies may cause.  Indication/Diagnosis: chronic migraine  Description of procedure:  The patient was placed in a sitting position. The standard protocol was used for Botox as follows, with 5 units of Botox injected at each site:   -Procerus muscle, midline injection  -Corrugator muscle, bilateral injection  -Frontalis muscle, bilateral injection, with 2 sites each side, medial injection was performed in the upper one third of the frontalis muscle, in the region vertical from the medial inferior edge of the superior orbital rim. The lateral injection was again in the upper one third of the forehead vertically above the lateral limbus of the cornea, 1.5 cm lateral to the medial injection site.  -Temporalis muscle injection, 4 sites, bilaterally. The first injection was 3 cm above the tragus of the ear, second injection site was 1.5 cm to 3 cm up from the first injection site in line with the tragus of the ear. The third injection site was 1.5-3 cm forward between the first 2 injection sites. The fourth injection site was 1.5 cm posterior to the second injection site.  -Occipitalis muscle injection, 3 sites, bilaterally. The first injection was done one half way between the occipital protuberance and the tip of the mastoid process behind the ear. The second injection site was done lateral and superior to the first, 1 fingerbreadth from the first injection. The third injection site was 1  fingerbreadth superiorly and medially from the first injection site.  -Cervical paraspinal muscle injection, 2 sites, bilateral knee first injection site was 1 cm from the midline of the cervical spine, 3 cm inferior to the lower border of the occipital protuberance. The second injection site was 1.5 cm superiorly and laterally to the first injection site.  -Trapezius muscle injection was performed at 3 sites, bilaterally. The first injection site was in the upper trapezius muscle halfway between the inflection point of the neck, and the acromion. The second injection site was one half way between the acromion and the first injection site. The third injection was done between the first injection site and the inflection point of the neck.   Will return for repeat injection in 3 months.   A 200 units of Botox was used, 155 units were injected, 45Uof the Botox was wasted. The patient tolerated the procedure well, there were no complications of the above procedure.  Naomie Dean, MD [facility] [address], [address] [place], [place] [address] [phone]

## 2022-11-23 ENCOUNTER — Other Ambulatory Visit: Payer: Self-pay | Admitting: Internal Medicine

## 2022-11-26 ENCOUNTER — Telehealth: Payer: Self-pay

## 2022-11-26 ENCOUNTER — Other Ambulatory Visit (HOSPITAL_COMMUNITY): Payer: Self-pay

## 2022-11-26 ENCOUNTER — Telehealth: Payer: Self-pay | Admitting: *Deleted

## 2022-11-26 MED ORDER — VENLAFAXINE HCL ER 75 MG PO CP24: 75.0000 mg | ORAL_CAPSULE | Freq: Every day | ORAL | 11 refills | Status: DC

## 2022-11-26 MED ORDER — VENLAFAXINE HCL ER 150 MG PO CP24: 150.0000 mg | ORAL_CAPSULE | Freq: Every day | ORAL | 11 refills | Status: DC

## 2022-11-26 NOTE — Telephone Encounter (Signed)
Pt states unable to fill higher dose of Venlafaxine due to PA being needed.

## 2022-11-26 NOTE — Telephone Encounter (Signed)
I spoke with the pharmacy.  They are stating the insurance seems to be having concerns about approving the Venlafaxine 225 mg XR tablet and the pharmacist suggests instead prescribing 150 mg XR +75 mg XR. I spoke with Dr Lucia Gaskins and received verbal order for both. Patient has been updated.

## 2022-11-26 NOTE — Telephone Encounter (Signed)
Please advise 

## 2022-11-27 ENCOUNTER — Encounter: Payer: Self-pay | Admitting: Internal Medicine

## 2022-11-27 ENCOUNTER — Telehealth: Payer: Self-pay

## 2022-11-27 NOTE — Telephone Encounter (Signed)
*  [facility]  Pharmacy Patient Advocate Encounter   Received notification from CoverMyMeds that prior authorization for Emgality 120MG /ML auto-injectors (migraine)  is required/requested.   Insurance verification completed.   The patient is insured through [facility] .   Per test claim: PA required; PA submitted to [facility] via CoverMyMeds Key/confirmation #/EOC [id number] Status is pending

## 2022-11-28 NOTE — Telephone Encounter (Signed)
Pharmacy Patient Advocate Encounter  Received notification from [facility] MEDICAID that Prior Authorization for Emgality 120MG /ML auto-injectors (migraine) has been APPROVED from 11/27/2022 to 11/27/2023.   PA #/Case ID/Reference #: [id number]

## 2022-11-29 ENCOUNTER — Telehealth: Payer: Medicaid Other | Admitting: Internal Medicine

## 2022-12-03 NOTE — Telephone Encounter (Signed)
Ok to renew Also ask her how she is doing with the design of breathable compression wear for POTS  pts

## 2022-12-05 DIAGNOSIS — Z0271 Encounter for disability determination: Secondary | ICD-10-CM

## 2022-12-12 ENCOUNTER — Encounter: Payer: Self-pay | Admitting: Neurology

## 2022-12-12 MED ORDER — QULIPTA 60 MG PO TABS: 60.0000 mg | ORAL_TABLET | Freq: Every day | ORAL | Status: DC

## 2022-12-12 NOTE — Telephone Encounter (Signed)
Sample order for Qulipta 60 mg placed. Samples up front for pt's husband to pickup per pt request via telephone call with room phone.

## 2022-12-13 ENCOUNTER — Other Ambulatory Visit: Payer: Self-pay | Admitting: Neurology

## 2022-12-13 ENCOUNTER — Other Ambulatory Visit: Payer: Self-pay | Admitting: *Deleted

## 2022-12-13 MED ORDER — QULIPTA 60 MG PO TABS: 60.0000 mg | ORAL_TABLET | Freq: Every day | ORAL | 5 refills | Status: DC

## 2022-12-17 ENCOUNTER — Ambulatory Visit: Payer: Medicaid Other | Attending: Internal Medicine | Admitting: Internal Medicine

## 2022-12-17 ENCOUNTER — Encounter: Payer: Self-pay | Admitting: Internal Medicine

## 2022-12-17 DIAGNOSIS — I10 Essential (primary) hypertension: Secondary | ICD-10-CM

## 2022-12-17 NOTE — Progress Notes (Unsigned)
Electrophysiology TeleHealth Note      Date:  12/17/2022   ID:  Mia Rubio, DOB 1981-02-14, MRN [id number]  Location: patient's home  Provider location: [address] [place]  Evaluation Performed: Follow-up visit  PCP:  Stamey, Verda Cumins, FNP  Cardiologist:     Electrophysiologist:  SK   Chief Complaint:  dysautonomia  History of Present Illness:   My location is [facility]. The Patients location is their home. Mia Rubio is a 42 y.o. female who presents via audio conferencing for a telehealth visit today.  Since last being seen in our clinic for dysautonomia with syncope, postural tachycardia orthostatic and heat intolerance, elevated blood pressures SVT ablation-AV nodal reentry with subsequent decompensation with recommendations at her last visit to decrease her salt, increase her ProAmatine and continue the beta-blockers with subsequent introduction of ivabradine the patient reports fatigue/exhaustion/dypsnea; ivabradine has helped with less dizziness, but recurring this month.   Worst symptoms are standing from crouching.  HR >.180  Orthostatic vital signs were done after the phone call, they are listed in patient advice there was a delta heart rate -Standing at 5 minutes of 66--111 and a blood pressure delta sitting--standing to 5 minutes 123--97   Date Cr K Hgb  3/21  0.81   4.3    7/23  0.94 4.0 14.9     The patient denies symptoms of fevers, chills, cough, or new SOB worrisome for COVID 19.   Past Medical History:  Diagnosis Date   Common migraine with intractable migraine 12/19/2017   Hypotension    Low blood pressure    Panic disorder 12/19/2017   "something that happens when I have a flare" regarding POTS   POTS (postural orthostatic tachycardia syndrome)    SVT (supraventricular tachycardia) (HCC)    Tachycardia     Past Surgical History:  Procedure Laterality Date   APPENDECTOMY     BREAST EXCISIONAL BIOPSY  Right    age 58   BREAST SURGERY     CESAREAN SECTION N/A 08/19/2014   Procedure: CESAREAN SECTION;  Surgeon: Myna Hidalgo, DO;  Location: [facility];  Service: Obstetrics;  Laterality: N/A;   DILATION AND EVACUATION N/A 11/24/2016   Procedure: DILATATION AND EVACUATION;  Surgeon: Kirkland Hun, MD;  Location: [facility];  Service: Gynecology;  Laterality: N/A;   FOOT SURGERY  2011 2012   KNEE SURGERY  2001  2002   plantar fasciitis     SVT ABLATION N/A 08/22/2021   Procedure: SVT ABLATION;  Surgeon: Marinus Maw, MD;  Location: [facility] INVASIVE CV LAB;  Service: Cardiovascular;  Laterality: N/A;    Current Outpatient Medications  Medication Sig Dispense Refill   acetaminophen (TYLENOL 8 HOUR) 650 MG CR tablet 2 tablets as needed     ALPRAZolam (XANAX XR) 1 MG 24 hr tablet Take 1 mg by mouth daily.     ALPRAZolam (XANAX) 0.5 MG tablet Take 0.5 mg by mouth daily at 6 PM. PRN     atenolol (TENORMIN) 25 MG tablet Take 1 tablet (25 mg total) by mouth daily. 90 tablet 3   Atogepant (QULIPTA) 60 MG TABS Take 1 tablet (60 mg total) by mouth daily. 30 tablet 5   BOTOX 200 units injection INJECT 155 UNITS INTRAMUSCULARLY TO HEAD AND NECK EVERY 12 WEEKS  (DISCARD UNUSED AFTER FIRST USE) 1 each 1   buPROPion (WELLBUTRIN SR) 150 MG 12 hr tablet Take 150 mg by mouth every morning.  dicyclomine (BENTYL) 20 MG tablet Take 20 mg by mouth daily as needed for spasms.     fluticasone (FLONASE) 50 MCG/ACT nasal spray Place 1 spray into both nostrils as needed.     Galcanezumab-gnlm (EMGALITY) 120 MG/ML SOAJ Inject 120 mg into the skin every 30 (thirty) days. 1.12 mL 11   ibuprofen (ADVIL) 800 MG tablet TAKE 1 TABLET(800 MG) BY MOUTH THREE TIMES DAILY AS NEEDED 90 tablet 11   ipratropium (ATROVENT) 0.06 % nasal spray 2 sprays in each nostril     ivabradine (CORLANOR) 5 MG TABS tablet Take 1 tablet (5 mg total) by mouth 2 (two) times daily with a meal. 180 tablet 3   ketorolac (TORADOL) 60 MG/2ML SOLN injection  Inject 1-35ml (30-60mg ) intramuscularly at onset of migraine. May repeat in 6 hours. Max twice a day and 4 days per month. 10 mL 0   meclizine (ANTIVERT) 25 MG tablet TAKE 1 TABLET(25 MG) BY MOUTH THREE TIMES DAILY AS NEEDED FOR DIZZINESS 30 tablet 0   midodrine (PROAMATINE) 5 MG tablet Take 1 tablet (5 mg total) by mouth 3 (three) times daily with meals. 270 tablet 3   Multiple Vitamin (MULTIVITAMIN PO) Take 2 tablets by mouth at bedtime.     ondansetron (ZOFRAN-ODT) 8 MG disintegrating tablet DISSOLVE 1 TABLET(8 MG) UNDER THE TONGUE EVERY 8 HOURS AS NEEDED FOR NAUSEA OR VOMITING 30 tablet 11   Rimegepant Sulfate (NURTEC) 75 MG TBDP Take 75 mg by mouth daily as needed (Migraines). 16 tablet 11   rizatriptan (MAXALT-MLT) 10 MG disintegrating tablet Take 1 tablet (10 mg total) by mouth as needed for migraine. May repeat in 2 hours if needed. Do not exceed 2 tablets in 24 hours. Do not take more than 2-3 times per week. 9 tablet 5   SLYND 4 MG TABS Take 4 mg by mouth daily.     SYRINGE-NEEDLE, DISP, 3 ML (B-D 3CC LUER-LOK SYR 25GX1") 25G X 1" 3 ML MISC Use with Toradol 5 each 0   venlafaxine XR (EFFEXOR-XR) 150 MG 24 hr capsule Take 1 capsule (150 mg total) by mouth daily with breakfast. Take with 75 mg for total of 225 mg daily. 30 capsule 11   venlafaxine XR (EFFEXOR-XR) 75 MG 24 hr capsule Take 1 capsule (75 mg total) by mouth daily with breakfast. Take with 150 mg for total of 225 mg daily. 30 capsule 11   zolpidem (AMBIEN) 10 MG tablet Take 10 mg by mouth at bedtime.     Potassium Citrate,Elemental K, 99 MG CAPS      No current facility-administered medications for this visit.    Allergies:   Caffeine, Ciprofloxacin, Codeine, Meperidine hcl, Ondansetron, Phenergan [promethazine], Prednisone, Septra [sulfamethoxazole-trimethoprim], and Phenergan [promethazine hcl]   ROS:  Please see the history of present illness.   All other systems are personally reviewed and negative.    Exam:    Vital  Signs:  There were no vitals taken for this visit.    Labs/Other Tests and Data Reviewed:    Recent Labs: No results found for requested labs within last 365 days.   Wt Readings from Last 3 Encounters:  06/20/22 188 lb 3.2 oz (85.4 kg)  04/09/22 193 lb 6.4 oz (87.7 kg)  04/04/22 195 lb (88.5 kg)     Other studies personally reviewed: Additional studies/ records that were reviewed today include:      ASSESSMENT & PLAN:    Syncope   Swelling dyspnea on exertion   Orthostatic and  heat intolerance   Postural Tachycardia   Nonpositional tachycardia   Elevated blood pressure   Miscarriages--multiple   Anxiety   SVT status post ablation with subsequent dysautonomic decompensation        Follow-up:  66m    Current medicines are reviewed at length with the patient today.   The patient  concerns regarding her medicines.  The following changes were made today:increase your midorine to every 4 hrs while awake  Consider will stop atenolol and will  increase ivabradine to 7.5 bid  Labs/ tests ordered today include:  Orthostatics at home USe APPLE watch ECG for rapid rates No orders of the defined types were placed in this encounter.     Today, I have spent 21 minutes with the patient with telehealth technology discussing the above.  Signed, Sherryl Manges, MD  12/17/2022 5:35 PM     [facility] [address] [place] [address] [phone] (office) [phone] (fax)

## 2022-12-18 ENCOUNTER — Encounter: Payer: Self-pay | Admitting: Internal Medicine

## 2022-12-18 MED ORDER — IVABRADINE HCL 5 MG PO TABS: 7.5000 mg | ORAL_TABLET | Freq: Two times a day (BID) | ORAL | 3 refills | Status: DC

## 2022-12-19 MED ORDER — IVABRADINE HCL 5 MG PO TABS: 7.5000 mg | ORAL_TABLET | Freq: Two times a day (BID) | ORAL | 1 refills | Status: DC

## 2022-12-19 NOTE — Addendum Note (Signed)
Addended by: Alois Cliche on: 12/19/2022 11:31 AM   Modules accepted: Orders

## 2022-12-22 ENCOUNTER — Other Ambulatory Visit: Payer: Self-pay | Admitting: Neurology

## 2022-12-25 ENCOUNTER — Other Ambulatory Visit (HOSPITAL_COMMUNITY): Payer: Self-pay

## 2022-12-25 ENCOUNTER — Telehealth: Payer: Self-pay | Admitting: Pharmacy Technician

## 2022-12-25 NOTE — Telephone Encounter (Signed)
Pharmacy Patient Advocate Encounter   Received notification from CoverMyMeds that prior authorization for ivabradine is required/requested.   Insurance verification completed.   The patient is insured through [facility] MEDICAID .   Per test claim: PA required; PA submitted to [facility] MEDICAID via CoverMyMeds Key/confirmation #/EOC [id number] Status is pending

## 2022-12-25 NOTE — Telephone Encounter (Signed)
Pharmacy Patient Advocate Encounter  Received notification from [facility] MEDICAID that Prior Authorization for ivabradine has been APPROVED from 12/25/22 to 12/25/23   PA #/Case ID/Reference #: [id number]

## 2022-12-26 ENCOUNTER — Encounter: Payer: Self-pay | Admitting: Neurology

## 2022-12-27 MED ORDER — QULIPTA 60 MG PO TABS: 60.0000 mg | ORAL_TABLET | Freq: Every day | ORAL | Status: DC

## 2022-12-27 NOTE — Telephone Encounter (Signed)
Qulipta 60 mg x 12 tabs (3 boxes) ready for pt pickup along with medication list/instructions.

## 2023-01-04 ENCOUNTER — Other Ambulatory Visit: Payer: Self-pay | Admitting: Neurology

## 2023-01-08 ENCOUNTER — Encounter: Payer: Self-pay | Admitting: Podiatry

## 2023-01-08 ENCOUNTER — Other Ambulatory Visit: Payer: Self-pay | Admitting: Neurology

## 2023-01-08 ENCOUNTER — Encounter: Payer: Self-pay | Admitting: *Deleted

## 2023-01-08 ENCOUNTER — Ambulatory Visit (INDEPENDENT_AMBULATORY_CARE_PROVIDER_SITE_OTHER): Payer: Medicaid Other | Admitting: Podiatry

## 2023-01-08 DIAGNOSIS — M2141 Flat foot [pes planus] (acquired), right foot: Secondary | ICD-10-CM

## 2023-01-08 DIAGNOSIS — M2142 Flat foot [pes planus] (acquired), left foot: Secondary | ICD-10-CM | POA: Diagnosis not present

## 2023-01-08 DIAGNOSIS — L600 Ingrowing nail: Secondary | ICD-10-CM

## 2023-01-08 NOTE — Telephone Encounter (Signed)
Per pt, pharmacy states Bennie Pierini needs a PA.

## 2023-01-09 NOTE — Progress Notes (Signed)
  Subjective:  Patient ID: Mia Rubio, female    DOB: 1980-06-12,  MRN: [id number]  Chief Complaint  Patient presents with   Ingrown Toenail    Patient states would like to discuss orthopedic shoes and has two ingrown toe nails on both feet.    Discussed the use of AI scribe software for clinical note transcription with the patient, who gave verbal consent to proceed.  History of Present Illness   The patient, with a history of multiple foot surgeries including plantar fasciitis and bunion surgery on both feet, presents with foot pain and worn out insoles. She has been using the same insoles for several years and is seeking new ones. She also reports pain from ingrown toenails on the medial sides of both second toes. The patient has been managing the ingrown toenails without any infections. She recently returned from a trip to [place] where she did a lot of walking, which has resulted in her feet feeling 'tight' and her knees hurting. She believes her current shoes are contributing to her discomfort and is seeking new shoes. She has tried several brands including Asics, Saucony, and Farwell, but has not found them comfortable. She has been using Fitflops with thick soles and found them to provide more support than her current shoes.          Objective:    Physical Exam   MUSCULOSKELETAL: Mild ingrown toenails on the medial borders of both second toes. No paronychia noted. Past planus deformity noted. No pain on palpation of the heel.       No images are attached to the encounter.    Results          Assessment:   1. Ingrowing right great toenail   2. Ingrowing left great toenail   3. Pes planus of both feet      Plan:  Patient was evaluated and treated and all questions answered.  Assessment and Plan    Ingrown Toenails   She has mild ingrown medial borders of both second toes without any signs of paronychia. We discussed the importance of wearing consistent footwear  and the risks of infection from inconsistent care. We will schedule a follow-up appointment in a few weeks to address the ingrown toenails.  Foot Pain   She has a history of plantar fasciitis and bunion surgeries. Her current insoles are worn down, causing discomfort, especially after prolonged walking. We discussed the importance of supportive footwear and the potential benefits of new orthotics. We will refer her to an orthotist for fitting of new orthotics and recommend purchasing new supportive shoes, specifically Ultra or Hoka models. We also suggest considering the temporary use of [place] inserts available on [employer].  Follow-up   We plan to review her new shoes and progress at the next appointment, likely the week after Thanksgiving.          Return in about 3 weeks (around 01/29/2023) for bilateral 2nd ingrown toenail removals.

## 2023-01-10 ENCOUNTER — Telehealth: Payer: Self-pay

## 2023-01-10 ENCOUNTER — Telehealth: Payer: Self-pay | Admitting: Neurology

## 2023-01-10 NOTE — Telephone Encounter (Signed)
PA request has been Submitted. New Encounter created for follow up. For additional info see Pharmacy Prior Auth telephone encounter from 11/14.

## 2023-01-10 NOTE — Telephone Encounter (Signed)
Received fax from [facility] that prior auth needs renewed. Submitted new auth, status is pending. Key: [id number]

## 2023-01-10 NOTE — Telephone Encounter (Signed)
Received fax that Berkley Harvey was approved.  Auth: [id number] (01/10/23-01/10/24)

## 2023-01-10 NOTE — Telephone Encounter (Signed)
*  [facility]  Pharmacy Patient Advocate Encounter   Received notification from CoverMyMeds that prior authorization for Qulipta 60MG  tablets  is required/requested.   Insurance verification completed.   The patient is insured through [facility] .   Per test claim: PA required; PA submitted to above mentioned insurance via CoverMyMeds Key/confirmation #/EOC [id number] Status is pending

## 2023-01-12 ENCOUNTER — Other Ambulatory Visit: Payer: Self-pay | Admitting: Neurology

## 2023-01-12 DIAGNOSIS — G43009 Migraine without aura, not intractable, without status migrainosus: Secondary | ICD-10-CM

## 2023-01-14 ENCOUNTER — Other Ambulatory Visit: Payer: Self-pay | Admitting: *Deleted

## 2023-01-14 DIAGNOSIS — G43009 Migraine without aura, not intractable, without status migrainosus: Secondary | ICD-10-CM

## 2023-01-14 MED ORDER — NURTEC 75 MG PO TBDP: 75.0000 mg | ORAL_TABLET | Freq: Every day | ORAL | 3 refills | Status: DC | PRN

## 2023-01-18 NOTE — Telephone Encounter (Signed)
Pharmacy Patient Advocate Encounter  Received notification from [facility] MEDICAID that Prior Authorization for Qulipta 60MG  tablets has been DENIED.  Full denial letter will be uploaded to the media tab. See denial reason below.   PA #/Case ID/Reference #: PA Case ID #: [id number]

## 2023-01-21 NOTE — Telephone Encounter (Signed)
Yes, please order Ajovy thank you!

## 2023-01-21 NOTE — Telephone Encounter (Signed)
Dr Lucia Gaskins, pt's medicaid plan will not approve Qulipta without her first trying Ajovy and Aimovig. She confirmed she's never tried those, just [employer] If patient ok, can we try Ajovy? She's been getting Qulipta samples but we cannot get this for her through insurance yet without failing other meds.

## 2023-01-31 ENCOUNTER — Ambulatory Visit: Payer: Medicaid Other

## 2023-01-31 ENCOUNTER — Ambulatory Visit: Payer: Medicaid Other | Admitting: Podiatry

## 2023-01-31 NOTE — Telephone Encounter (Signed)
 Delivery is pending pt consent.

## 2023-01-31 NOTE — Telephone Encounter (Signed)
Updated appt note, thank you.

## 2023-01-31 NOTE — Progress Notes (Signed)
RX given to Dr. Lilian Kapur to sign for CFO's patient has [facility] referred to [facility]

## 2023-01-31 NOTE — Telephone Encounter (Signed)
[facility] (Rod) scheduling delivery of Botox, Rod had patient on the other line at the beginning of the call. Will deliver on Wednesday, 02/06/23 with signature is required. Phone: [phone]

## 2023-02-11 ENCOUNTER — Ambulatory Visit: Payer: Medicaid Other | Admitting: Neurology

## 2023-02-11 DIAGNOSIS — G43709 Chronic migraine without aura, not intractable, without status migrainosus: Secondary | ICD-10-CM | POA: Diagnosis not present

## 2023-02-11 DIAGNOSIS — G43511 Persistent migraine aura without cerebral infarction, intractable, with status migrainosus: Secondary | ICD-10-CM

## 2023-02-11 DIAGNOSIS — G43009 Migraine without aura, not intractable, without status migrainosus: Secondary | ICD-10-CM

## 2023-02-11 MED ORDER — VENLAFAXINE HCL ER 75 MG PO CP24: 75.0000 mg | ORAL_CAPSULE | Freq: Every day | ORAL | 11 refills | Status: DC

## 2023-02-11 MED ORDER — ONABOTULINUMTOXINA 200 UNITS IJ SOLR
155.0000 [IU] | Freq: Once | INTRAMUSCULAR | Status: AC
  Administered 2023-02-11: 155 [IU] via INTRAMUSCULAR

## 2023-02-11 MED ORDER — VENLAFAXINE HCL ER 150 MG PO CP24: 150.0000 mg | ORAL_CAPSULE | Freq: Every day | ORAL | 11 refills | Status: DC

## 2023-02-11 MED ORDER — LAMOTRIGINE ER 50 MG PO TB24: 100.0000 mg | ORAL_TABLET | Freq: Every day | ORAL | 3 refills | Status: DC

## 2023-02-11 NOTE — Progress Notes (Signed)
02/11/2023: stable, doing great as far as decreased migraine freq and severity > 50% but still has auras.Patient feels that her migraines cause her jaw to ache in her jaw aching also can cause her migraines to worsen it is a trigger for migraines included 10 units in each masseter to see if that helps with migraine severity.   She gets zaps when she moves her eyes to the left and right. Electric shocks into the tongue and lips. Getting worse. Mor on the left side into the eye mostly V1 area but also into V2 and V3. Shooting back and ear hurts Could order an MRI trigeminal protocol. She is seeing neurosurgery and if they don;t perform any testing we can order MRI brain w/ trigeminal protocol and consider CTA and MRI c-spine Lamictal (lamotrigine) is considered a potential treatment option for migraine with aura, with studies showing it can effectively reduce the frequency and severity of aura symptoms in many patients, making it a possible preventative medication for those experiencing migraine aura episodes regularly;   Meds ordered this encounter  Medications   botulinum toxin Type A (BOTOX) injection 155 Units    Botox- 200 units x 1 vial Lot: [id number] Expiration: 10/2024 NDC: 4540-9811-91  Dx: Y78.295 SP Witnessed by Berna Spare CMA   lamoTRIgine (LAMICTAL XR) 50 MG 24 hour tablet    Sig: Take 2 tablets (100 mg total) by mouth at bedtime. Take 50 mg to start. If in one week no side effects increase to 100mg  (2 pills) at bedtime. Can furthe rincrease it up to 200mg  if tolerated.    Dispense:  60 tablet    Refill:  3   venlafaxine XR (EFFEXOR-XR) 150 MG 24 hr capsule    Sig: Take 1 capsule (150 mg total) by mouth daily with breakfast. Take with 75 mg for total of 225 mg daily.    Dispense:  90 capsule    Refill:  11    Cancel venlafaxine ER 225 mg tablet   venlafaxine XR (EFFEXOR-XR) 75 MG 24 hr capsule    Sig: Take 1 capsule (75 mg total) by mouth daily with breakfast. Take with  150 mg for total of 225 mg daily.    Dispense:  90 capsule    Refill:  11      11/14/2022: discussed allodynia in migraines she is stable but has an aura often and allodynia discussed options including  changing emgality to qulipta(gave samples she will qulipta along with emgality and see if she improves then possibly consider discontinuing Emgality). Gave Samples: Start Qulipta for 3 weeks and let me know how you are doing Can think about changing Emgality to Vyepti she has > 50% improvement but still has 15 total headache days and 8 migraines a month without aura, no medication overuse or aura with these She does have concomitant migraines with aura without headaches. Discussed the role of Magnesium in migraine with aura. Discussed trying Lamictal which is good with migraine aura but not headache but she suffers from migraine aura alone almost every day Can continue emgality and try qulipta but will try and get vyepti approved and think about trying magnesium and lamictal for migraine   Sent to patient:  11/14/2022: discussed allodynia in migraines she is stable but has an aura often and allodynia discussed options including  changing emgality to qulipta(gave samples you may take qulipta daily along with emgality and see if you improve then possibly consider discontinuing Emgality can give me an update).  Gave Samples: Start Qulipta for 3 weeks and let me know how you are doing Can think about changing Emgality to [facility], will try and get Vyepti approved and if so can discuss further how to incorporate this and what we may chose to stop Also Discussed the role of Magnesium in migraine with aura. Discussed trying Lamictal which is good with migraine aura but not headache but she suffers from migraine aura alone almost every day 5. Increase venlafaxine 6. Continue nurtec once dailyas needed  Magnesium doses depending on which one you take(pick one) and other OTC medications you can try: 200 mg  mag citrate 1-2x daily OR 400 mg mag oxide 1-2x daily OR Mag sulfate 400 to 600 mg daily(can   Other optional vitamin supplementation that may work includes 1000 international units AND vitamin D3 daily AND 500 to 1000 mg of B12 daily AND Riboflavin 400 mg a day AND 150 to 300 mg co-Q10 daily Meds ordered this encounter  Medications   botulinum toxin Type A (BOTOX) injection 155 Units    Botox- 200 units x 1 vial Lot: [id number] Expiration: 10/2024 NDC: 6578-4696-29  Dx: B28.413 SP Witnessed by Berna Spare CMA   lamoTRIgine (LAMICTAL XR) 50 MG 24 hour tablet    Sig: Take 2 tablets (100 mg total) by mouth at bedtime. Take 50 mg to start. If in one week no side effects increase to 100mg  (2 pills) at bedtime. Can furthe rincrease it up to 200mg  if tolerated.    Dispense:  60 tablet    Refill:  3   venlafaxine XR (EFFEXOR-XR) 150 MG 24 hr capsule    Sig: Take 1 capsule (150 mg total) by mouth daily with breakfast. Take with 75 mg for total of 225 mg daily.    Dispense:  90 capsule    Refill:  11    Cancel venlafaxine ER 225 mg tablet   venlafaxine XR (EFFEXOR-XR) 75 MG 24 hr capsule    Sig: Take 1 capsule (75 mg total) by mouth daily with breakfast. Take with 150 mg for total of 225 mg daily.    Dispense:  90 capsule    Refill:  11      08/22/2022: At baseline had migraines daily. She gets >50% relief in migraine frequency. But still have 8 migraine days a month and 15 headache days a month > 3 months despite her great improvement with botox. Will try emgality.   05/29/2022: stable  03/05/2022: stable doing great  11/30/2021: Prior to botox daily headaches and most of the month daily migraines maybe even daily. Botox has changed her life. She has a lot of light sensitivity. But even the light sensitivity is better, she wears FL-41. After botox has 6 evere migraine days a month and < 14 total headache days a month significant >>50% improvement on botox in frequ and severity. Failed  ubrelvy, imitrex, rizatriptan, zomig. Can try ubrelvy if nurtec not approved. Affects her ability to drive, light sensitivity affects her ability to go outside, filled out a car tinting form, still has 6 -7 severe migraine days a month and headaches, has POTS with fatigue and exercise instability.  She cannot work 8 hours straight without restm migraines exacerbated by fatigue, lights, exertion, significantly impacting her life despite improvement by botox. Higher incidence of POTS with migraines.   09/07/2021: Botox is working well.  06/14/2021: Reports botox working well. Two weeks late for botox so she has had more migraines. 1/ week migraine when taking botox  consistently. Never severe.    BOTOX PROCEDURE NOTE FOR MIGRAINE HEADACHE    Contraindications and precautions discussed with patient(above). Aseptic procedure was observed and patient tolerated procedure. Procedure performed by Butch Penny, NP  The condition has existed for more than 6 months, and pt does not have a diagnosis of ALS, Myasthenia Gravis or Lambert-Eaton Syndrome.  Risks and benefits of injections discussed and pt agrees to proceed with the procedure.  Written consent obtained  These injections are medically necessary. These injections do not cause sedations or hallucinations which the oral therapies may cause.  Indication/Diagnosis: chronic migraine  Description of procedure:  The patient was placed in a sitting position. The standard protocol was used for Botox as follows, with 5 units of Botox injected at each site:   -Procerus muscle, midline injection  -Corrugator muscle, bilateral injection  -Frontalis muscle, bilateral injection, with 2 sites each side, medial injection was performed in the upper one third of the frontalis muscle, in the region vertical from the medial inferior edge of the superior orbital rim. The lateral injection was again in the upper one third of the forehead vertically above the  lateral limbus of the cornea, 1.5 cm lateral to the medial injection site.  -Temporalis muscle injection, 4 sites, bilaterally. The first injection was 3 cm above the tragus of the ear, second injection site was 1.5 cm to 3 cm up from the first injection site in line with the tragus of the ear. The third injection site was 1.5-3 cm forward between the first 2 injection sites. The fourth injection site was 1.5 cm posterior to the second injection site.  -Occipitalis muscle injection, 3 sites, bilaterally. The first injection was done one half way between the occipital protuberance and the tip of the mastoid process behind the ear. The second injection site was done lateral and superior to the first, 1 fingerbreadth from the first injection. The third injection site was 1 fingerbreadth superiorly and medially from the first injection site.  -Cervical paraspinal muscle injection, 2 sites, bilateral knee first injection site was 1 cm from the midline of the cervical spine, 3 cm inferior to the lower border of the occipital protuberance. The second injection site was 1.5 cm superiorly and laterally to the first injection site.  -Trapezius muscle injection was performed at 3 sites, bilaterally. The first injection site was in the upper trapezius muscle halfway between the inflection point of the neck, and the acromion. The second injection site was one half way between the acromion and the first injection site. The third injection was done between the first injection site and the inflection point of the neck.   Will return for repeat injection in 3 months.   A 200 units of Botox was used, 155 units were injected, 45Uof the Botox was wasted. The patient tolerated the procedure well, there were no complications of the above procedure.  Naomie Dean, MD [facility] [address] [place], [place] [address] [phone]

## 2023-02-11 NOTE — Progress Notes (Signed)
Botox- 200 units x 1 vial Lot: [id number] Expiration: 10/2024 NDC: 8469-6295-28  Bacteriostatic 0.9% Sodium Chloride- 4 mL  Lot: [id number] Expiration: 05/28/2023 NDC: 0102-7253-66  Dx: Y40.347 SP Witnessed by Berna Spare CMA

## 2023-02-25 ENCOUNTER — Ambulatory Visit: Payer: Medicaid Other | Admitting: Podiatry

## 2023-02-25 ENCOUNTER — Other Ambulatory Visit: Payer: Self-pay

## 2023-02-25 ENCOUNTER — Emergency Department (HOSPITAL_BASED_OUTPATIENT_CLINIC_OR_DEPARTMENT_OTHER): Payer: Medicaid Other

## 2023-02-25 ENCOUNTER — Emergency Department (HOSPITAL_BASED_OUTPATIENT_CLINIC_OR_DEPARTMENT_OTHER)
Admission: EM | Admit: 2023-02-25 | Discharge: 2023-02-25 | Disposition: A | Discharge status: Home / Self Care | Payer: Medicaid Other | Attending: Emergency Medicine | Admitting: Emergency Medicine

## 2023-02-25 ENCOUNTER — Encounter (HOSPITAL_BASED_OUTPATIENT_CLINIC_OR_DEPARTMENT_OTHER): Payer: Self-pay

## 2023-02-25 ENCOUNTER — Other Ambulatory Visit: Payer: Self-pay | Admitting: Neurology

## 2023-02-25 DIAGNOSIS — M545 Low back pain, unspecified: Secondary | ICD-10-CM | POA: Diagnosis present

## 2023-02-25 DIAGNOSIS — R112 Nausea with vomiting, unspecified: Secondary | ICD-10-CM | POA: Diagnosis not present

## 2023-02-25 DIAGNOSIS — G43711 Chronic migraine without aura, intractable, with status migrainosus: Secondary | ICD-10-CM

## 2023-02-25 DIAGNOSIS — G43109 Migraine with aura, not intractable, without status migrainosus: Secondary | ICD-10-CM

## 2023-02-25 DIAGNOSIS — R109 Unspecified abdominal pain: Secondary | ICD-10-CM | POA: Insufficient documentation

## 2023-02-25 LAB — CBC
HCT: 45.5 % (ref 36.0–46.0)
Hemoglobin: 15.6 g/dL — ABNORMAL HIGH (ref 12.0–15.0)
MCH: 30.1 pg (ref 26.0–34.0)
MCHC: 34.3 g/dL (ref 30.0–36.0)
MCV: 87.7 fL (ref 80.0–100.0)
Platelets: 217 10*3/uL (ref 150–400)
RBC: 5.19 MIL/uL — ABNORMAL HIGH (ref 3.87–5.11)
RDW: 13 % (ref 11.5–15.5)
WBC: 7.3 10*3/uL (ref 4.0–10.5)
nRBC: 0 % (ref 0.0–0.2)

## 2023-02-25 LAB — URINALYSIS, ROUTINE W REFLEX MICROSCOPIC
Bilirubin Urine: NEGATIVE
Glucose, UA: NEGATIVE mg/dL
Nitrite: NEGATIVE
Specific Gravity, Urine: 1.03 (ref 1.005–1.030)
pH: 5.5 (ref 5.0–8.0)

## 2023-02-25 LAB — COMPREHENSIVE METABOLIC PANEL
ALT: 21 U/L (ref 0–44)
AST: 27 U/L (ref 15–41)
Albumin: 4.6 g/dL (ref 3.5–5.0)
Alkaline Phosphatase: 46 U/L (ref 38–126)
Anion gap: 10 (ref 5–15)
BUN: 17 mg/dL (ref 6–20)
CO2: 23 mmol/L (ref 22–32)
Calcium: 9.3 mg/dL (ref 8.9–10.3)
Chloride: 102 mmol/L (ref 98–111)
Creatinine, Ser: 0.94 mg/dL (ref 0.44–1.00)
GFR, Estimated: >60 mL/min (ref >60)
Glucose, Bld: 109 mg/dL — ABNORMAL HIGH (ref 70–99)
Potassium: 3.7 mmol/L (ref 3.5–5.1)
Sodium: 135 mmol/L (ref 135–145)
Total Bilirubin: 0.4 mg/dL (ref 0.0–1.2)
Total Protein: 7.9 g/dL (ref 6.5–8.1)

## 2023-02-25 LAB — HCG, QUANTITATIVE, PREGNANCY: hCG, Beta Chain, Quant, S: <1 m[IU]/mL (ref <5)

## 2023-02-25 LAB — PREGNANCY, URINE: Preg Test, Ur: NEGATIVE

## 2023-02-25 LAB — LIPASE, BLOOD: Lipase: 31 U/L (ref 11–51)

## 2023-02-25 MED ORDER — HYDROCODONE-ACETAMINOPHEN 5-325 MG PO TABS: 1.0000 | ORAL_TABLET | ORAL | 0 refills | Status: AC | PRN

## 2023-02-25 MED ORDER — HYDROCODONE-ACETAMINOPHEN 5-325 MG PO TABS: 1.0000 | ORAL_TABLET | ORAL | 0 refills | Status: DC | PRN

## 2023-02-25 MED ORDER — ONDANSETRON 4 MG PO TBDP: 4.0000 mg | ORAL_TABLET | Freq: Three times a day (TID) | ORAL | 0 refills | Status: DC | PRN

## 2023-02-25 MED ORDER — KETOROLAC TROMETHAMINE 15 MG/ML IJ SOLN
30.0000 mg | Freq: Once | INTRAMUSCULAR | Status: AC
  Administered 2023-02-25: 30 mg via INTRAMUSCULAR
  Filled 2023-02-25: qty 2

## 2023-02-25 MED ORDER — ONDANSETRON 4 MG PO TBDP
8.0000 mg | ORAL_TABLET | Freq: Once | ORAL | Status: AC
  Administered 2023-02-25: 8 mg via ORAL
  Filled 2023-02-25: qty 2

## 2023-02-25 MED ORDER — HYDROCODONE-ACETAMINOPHEN 5-325 MG PO TABS
1.0000 | ORAL_TABLET | Freq: Once | ORAL | Status: AC
  Administered 2023-02-25: 1 via ORAL
  Filled 2023-02-25: qty 1

## 2023-02-25 NOTE — ED Provider Triage Note (Signed)
Emergency Medicine Provider Triage Evaluation Note  Mia Rubio , a 42 y.o. female  was evaluated in triage.  Pt complains of right flank pain.  Reports abrupt onset of pain yesterday.  Took Zofran which helped somewhat nausea.  Pain worsened today prompting visit to the emergency department.  Denies any history of kidney stones personally but pretty extensive family history.  Denies any fever, urinary symptoms, change in bowel habits.  Reports vomiting began while in the lobby area  Review of Systems  Positive: See above Negative:   Physical Exam  BP (!) 118/93 (BP Location: Right Arm)   Pulse (!) 105   Temp 97.6 F (36.4 C)   Resp 18   SpO2 100%  Gen:   Awake, no distress   Resp:  Normal effort  MSK:   Moves extremities without difficulty  Other:  Right-sided CVA tenderness.    Medical Decision Making  Medically screening exam initiated at 5:18 PM.  Appropriate orders placed.  Mia Rubio was informed that the remainder of the evaluation will be completed by another provider, this initial triage assessment does not replace that evaluation, and the importance of remaining in the ED until their evaluation is complete.     Peter Garter, [place] 02/25/23 1719

## 2023-02-25 NOTE — ED Provider Notes (Addendum)
[place] EMERGENCY DEPARTMENT AT [facility] Provider Note   CSN: [id number] Arrival date & time: 02/25/23  1554     History  Chief Complaint  Patient presents with   Flank Pain    R   Back Pain    R    Mia Rubio is a 42 y.o. female.  Patient complains of pain in the right side of her back.  Patient reports the pain began yesterday.  Patient is concerned that she could have a urinary tract infection or a kidney stone.  Patient reports vomiting before coming to the emergency department today.  Patient reports she also has a migraine headache from the lights in the room.  Patient reports that she recently saw her dentist who thinks that she may have trigeminal neuralgia.  Patient reports she is having zapping sensation in her face.  Reports the pain medicine and nausea medicine that she was given on arrival did help with her symptoms.  Patient denies any fever or chills.  Patient reports that she has pain radiating to the right side of her abdomen.  The history is provided by the spouse.  Flank Pain This is a new problem. The current episode started 12 to 24 hours ago. The problem occurs constantly. The problem has been gradually worsening. Associated symptoms include abdominal pain. Nothing relieves the symptoms. She has tried acetaminophen for the symptoms. The treatment provided no relief.  Back Pain Associated symptoms: abdominal pain        Home Medications Prior to Admission medications   Medication Sig Start Date End Date Taking? Authorizing Provider  HYDROcodone-acetaminophen (NORCO/VICODIN) 5-325 MG tablet Take 1 tablet by mouth every 4 (four) hours as needed for up to 5 days for moderate pain (pain score 4-6). 02/25/23 03/02/23 Yes Cheron Schaumann K, PA-C  ondansetron (ZOFRAN-ODT) 4 MG disintegrating tablet Take 1 tablet (4 mg total) by mouth every 8 (eight) hours as needed for nausea or vomiting (may take with benadryl if you have itching). 02/25/23  Yes  Cheron Schaumann K, PA-C  acetaminophen (TYLENOL 8 HOUR) 650 MG CR tablet 2 tablets as needed    [provider]  ALPRAZolam (XANAX XR) 1 MG 24 hr tablet Take 1 mg by mouth daily.    [provider]  ALPRAZolam Prudy Feeler) 0.5 MG tablet Take 0.5 mg by mouth daily at 6 PM. PRN 07/10/21   [provider]  Atogepant (QULIPTA) 60 MG TABS Take 1 tablet (60 mg total) by mouth daily. 12/27/22   Micki Riley, MD  botulinum toxin Type A (BOTOX) 200 units injection INJECT 155 UNITS INTRAMUSCULARLY TO HEAD AND NECK EVERY 12 WEEKS  (DISCARD UNUSED AFTER FIRST USE) 01/08/23   [name], MD  buPROPion [facility] SR) 150 MG 12 hr tablet Take 150 mg by mouth every morning. 11/16/20   [provider]  dicyclomine (BENTYL) 20 MG tablet Take 20 mg by mouth daily as needed for spasms.    [provider]  fluticasone (FLONASE) 50 MCG/ACT nasal spray Place 1 spray into both nostrils as needed. 01/04/22   [provider]  Galcanezumab-gnlm (EMGALITY) 120 MG/ML SOAJ Inject 120 mg into the skin every 30 (thirty) days. 08/27/22   [name], MD  ibuprofen (ADVIL) 800 MG tablet TAKE 1 TABLET(800 MG) BY MOUTH THREE TIMES DAILY AS NEEDED 12/14/21   Naomie Dean B, MD  ipratropium (ATROVENT) 0.06 % nasal spray 2 sprays in each nostril 02/21/21   [provider]  ivabradine (CORLANOR) 5 MG TABS tablet Take 1.5 tablets (7.5 mg total) by mouth 2 (two) times daily with a meal. 12/19/22   Duke Salvia, MD  ketorolac (TORADOL) 60 MG/2ML SOLN injection Inject 1-47ml (30-60mg ) intramuscularly at onset of migraine. May repeat in 6 hours. Max twice a day and 4 days per month. 10/18/22   [name], MD  lamoTRIgine (LAMICTAL XR) 50 MG 24 hour tablet Take 2 tablets (100 mg total) by mouth at bedtime. Take 50 mg to start. If in one week no side effects increase to 100mg  (2 pills) at bedtime. Can furthe rincrease it up to 200mg  if tolerated. 02/11/23   [name],  MD  meclizine (ANTIVERT) 25 MG tablet TAKE 1 TABLET(25 MG) BY MOUTH THREE TIMES DAILY AS NEEDED FOR DIZZINESS 12/13/22   [name], MD  midodrine (PROAMATINE) 5 MG tablet Take 1 tablet (5 mg total) by mouth 3 (three) times daily with meals. 06/20/22   Duke Salvia, MD  Multiple Vitamin (MULTIVITAMIN PO) Take 2 tablets by mouth at bedtime.    [provider]  Potassium Citrate,Elemental K, 99 MG CAPS     [provider]  Rimegepant Sulfate (NURTEC) 75 MG TBDP Take 1 tablet (75 mg total) by mouth daily as needed (Migraines). 01/14/23   [name], MD  rizatriptan (MAXALT-MLT) 10 MG disintegrating tablet Take 1 tablet (10 mg total) by mouth as needed for migraine. May repeat in 2 hours if needed. Do not exceed 2 tablets in 24 hours. Do not take more than 2-3 times per week. 04/30/22   [name], MD  SLYND 4 MG TABS Take 4 mg by mouth daily. 08/15/21   [provider]  SYRINGE-NEEDLE, DISP, 3 ML (B-D 3CC LUER-LOK SYR 25GX1") 25G X 1" 3 ML MISC Use with Toradol 10/18/22   [name], MD  UNABLE TO FIND 1 each at bedtime. Med Name: magnesium at night for migraine/sleep    [provider]  venlafaxine XR (EFFEXOR-XR) 150 MG 24 hr capsule Take 1 capsule (150 mg total) by mouth daily with breakfast. Take with 75 mg for total of 225 mg daily. 02/11/23   [name], MD  venlafaxine XR (EFFEXOR-XR) 75 MG 24 hr capsule Take 1 capsule (75 mg total) by mouth daily with breakfast. Take with 150 mg for total of 225 mg daily. 02/11/23   [name], MD  zolpidem (AMBIEN) 10 MG tablet Take 10 mg by mouth at bedtime.    [provider]      Allergies    Caffeine, Ciprofloxacin, Codeine, Meperidine hcl, Ondansetron, Phenergan [promethazine], Prednisone, Septra [sulfamethoxazole-trimethoprim], and Phenergan [promethazine hcl]    Review of Systems   Review of Systems  Gastrointestinal:  Positive for abdominal pain.  Genitourinary:   Positive for flank pain.  Musculoskeletal:  Positive for back pain.  All other systems reviewed and are negative.   Physical Exam Updated Vital Signs BP 109/76   Pulse 93   Temp 97.6 F (36.4 C)   Resp 16   SpO2 100%  Physical Exam Vitals reviewed.  Constitutional:      Appearance: Normal appearance.  HENT:     Head: Normocephalic.     Mouth/Throat:     Mouth: Mucous membranes are moist.  Eyes:     Extraocular Movements: Extraocular movements intact.     Pupils: Pupils are equal, round, and reactive to light.  Cardiovascular:     Rate and Rhythm:  Normal rate.  Pulmonary:     Effort: Pulmonary effort is normal.  Abdominal:     General: Abdomen is flat.     Palpations: Abdomen is soft.  Musculoskeletal:        General: Normal range of motion.  Skin:    General: Skin is warm.  Neurological:     General: No focal deficit present.     Mental Status: She is alert.  Psychiatric:        Mood and Affect: Mood normal.     ED Results / Procedures / Treatments   Labs (all labs ordered are listed, but only abnormal results are displayed) Labs Reviewed  COMPREHENSIVE METABOLIC PANEL - Abnormal; Notable for the following components:      Result Value   Glucose, Bld 109 (*)    All other components within normal limits  CBC - Abnormal; Notable for the following components:   RBC 5.19 (*)    Hemoglobin 15.6 (*)    All other components within normal limits  URINALYSIS, ROUTINE W REFLEX MICROSCOPIC - Abnormal; Notable for the following components:   Hgb urine dipstick MODERATE (*)    Ketones, ur TRACE (*)    Protein, ur TRACE (*)    Leukocytes,Ua SMALL (*)    Bacteria, UA RARE (*)    All other components within normal limits  LIPASE, BLOOD  PREGNANCY, URINE  HCG, QUANTITATIVE, PREGNANCY    EKG None  Radiology CT Renal Stone Study Result Date: 02/25/2023 CLINICAL DATA:  Right flank pain for 2 days, initial encounter EXAM: CT ABDOMEN AND PELVIS WITHOUT CONTRAST  TECHNIQUE: Multidetector CT imaging of the abdomen and pelvis was performed following the standard protocol without IV contrast. RADIATION DOSE REDUCTION: This exam was performed according to the departmental dose-optimization program which includes automated exposure control, adjustment of the mA and/or kV according to patient size and/or use of iterative reconstruction technique. COMPARISON:  None Available. FINDINGS: Lower chest: No acute abnormality. Hepatobiliary: No focal liver abnormality is seen. No gallstones, gallbladder wall thickening, or biliary dilatation. Pancreas: Unremarkable. No pancreatic ductal dilatation or surrounding inflammatory changes. Spleen: Normal in size without focal abnormality. Adrenals/Urinary Tract: Adrenal glands are within normal limits. Kidneys demonstrate no definitive renal calculi or obstructive changes. Bladder is decompressed. Stomach/Bowel: The appendix has been surgically removed. No obstructive or inflammatory changes of the colon are seen. Small bowel and stomach are within normal limits. Multiple ingested tablets are noted the stomach. Vascular/Lymphatic: Aortic atherosclerosis. No enlarged abdominal or pelvic lymph nodes. Reproductive: Uterus and bilateral adnexa are unremarkable. Other: No abdominal wall hernia or abnormality. No abdominopelvic ascites. Musculoskeletal: No acute or significant osseous findings. IMPRESSION: No acute abnormality to correspond with the given clinical history. Electronically Signed   By: Alcide Clever M.D.   On: 02/25/2023 19:36    Procedures Procedures    Medications Ordered in ED Medications  ondansetron (ZOFRAN-ODT) disintegrating tablet 8 mg (8 mg Oral Given 02/25/23 1722)  HYDROcodone-acetaminophen (NORCO/VICODIN) 5-325 MG per tablet 1 tablet (1 tablet Oral Given 02/25/23 1727)  ketorolac (TORADOL) 15 MG/ML injection 30 mg (30 mg Intramuscular Given 02/25/23 2222)    ED Course/ Medical Decision Making/ A&P                                  Medical Decision Making Patient complains of right sided flank pain  Amount and/or Complexity of Data Reviewed Independent Historian: spouse  Details: Patient is here with her husband who is supportive External Data Reviewed: notes. Labs: ordered. Decision-making details documented in ED Course.    Details: UA shows rare bacteria.  Patient's glucose is slightly elevated at 109  Radiology: ordered and independent interpretation performed. Decision-making details documented in ED Course.    Details: CT renal shows no acute abnormality, appendix is surgically absent  Risk Prescription drug management. Risk Details: Patient reports she has developed a migraine she requested an injection of Toradol.  Patient is given an injection of Toradol.  She is advised to follow-up with her primary care physician for reevaluation of pain in her low back.           Final Clinical Impression(s) / ED Diagnoses Final diagnoses:  Acute right-sided low back pain without sciatica    Rx / DC Orders ED Discharge Orders          Ordered    ondansetron (ZOFRAN-ODT) 4 MG disintegrating tablet  Every 8 hours PRN        02/25/23 2217    HYDROcodone-acetaminophen (NORCO/VICODIN) 5-325 MG tablet  Every 4 hours PRN        02/25/23 2217           An After Visit Summary was printed and given to the patient.    Elson Areas, PA-C 02/25/23 2239    Elson Areas, PA-C 02/25/23 2241    Pricilla Loveless, MD 02/25/23 [id number]

## 2023-02-25 NOTE — ED Triage Notes (Signed)
Pt c/o "really bad pain in R back into R side" onset yesterday, last night didn't sleep. Pt advised that she threw up while in lobby, states that mom "thinks it's a kidney stone or something."

## 2023-02-26 ENCOUNTER — Other Ambulatory Visit (HOSPITAL_COMMUNITY): Payer: Self-pay

## 2023-02-26 ENCOUNTER — Encounter: Payer: Self-pay | Admitting: Neurology

## 2023-02-26 ENCOUNTER — Other Ambulatory Visit: Payer: Self-pay

## 2023-02-26 MED ORDER — BD LUER-LOK SYRINGE 25G X 1" 3 ML MISC
0 refills | Status: DC
  Filled 2023-02-26: qty 5, 5d supply, fill #0

## 2023-02-26 MED ORDER — KETOROLAC TROMETHAMINE 60 MG/2ML IM SOLN
INTRAMUSCULAR | 0 refills | Status: DC
  Filled 2023-02-26: qty 10, 3d supply, fill #0

## 2023-03-16 ENCOUNTER — Other Ambulatory Visit: Payer: Self-pay | Admitting: Pulmonary Disease

## 2023-03-19 ENCOUNTER — Ambulatory Visit: Payer: Medicaid Other | Admitting: Podiatry

## 2023-03-27 NOTE — H&P (Incomplete)
Mia Rubio is an 43 y.o. G2P1001 who is admitted for ***.  Patient Active Problem List   Diagnosis Date Noted   SVT (supraventricular tachycardia) (HCC)    Orthostasis    Chronic migraine without aura without status migrainosus, not intractable 04/28/2020   Migraine with aura and without status migrainosus, not intractable 04/28/2020   Light sensitivity 04/28/2020   POTS (postural orthostatic tachycardia syndrome) 12/19/2017   Panic disorder 12/19/2017   Common migraine with intractable migraine 12/19/2017   Missed abortion 11/24/2016   Intrauterine normal pregnancy 08/19/2014    Pertinent Gynecological History: Menses: {menses:16152} Bleeding: {uterine bleeding:32112} Contraception: {contraception:5051} Sexually transmitted diseases: {std risk:32110} Previous GYN Procedures: {previous procedures:3041388}  Last mammogram: {normal/abnormal***:32111} Date: *** Last pap: {normal/abnormal***:32111} Date: *** OB History: G2P1001   MEDICAL/FAMILY/SOCIAL HX: No LMP recorded. (Menstrual status: Oral contraceptives).    Past Medical History:  Diagnosis Date   Common migraine with intractable migraine 12/19/2017   Hypotension    Low blood pressure    Panic disorder 12/19/2017   "something that happens when I have a flare" regarding POTS   POTS (postural orthostatic tachycardia syndrome)    SVT (supraventricular tachycardia) (HCC)    Tachycardia     Past Surgical History:  Procedure Laterality Date   APPENDECTOMY     BREAST EXCISIONAL BIOPSY Right    age 38   BREAST SURGERY     CESAREAN SECTION N/A 08/19/2014   Procedure: CESAREAN SECTION;  Surgeon: Myna Hidalgo, DO;  Location: [facility];  Service: Obstetrics;  Laterality: N/A;   DILATION AND EVACUATION N/A 11/24/2016   Procedure: DILATATION AND EVACUATION;  Surgeon: Kirkland Hun, MD;  Location: [facility];  Service: Gynecology;  Laterality: N/A;   FOOT SURGERY  2011 2012   KNEE SURGERY  2001  2002   plantar fasciitis      SVT ABLATION N/A 08/22/2021   Procedure: SVT ABLATION;  Surgeon: Marinus Maw, MD;  Location: [facility] INVASIVE CV LAB;  Service: Cardiovascular;  Laterality: N/A;    Family History  Problem Relation Age of Onset   Heart murmur Father    Cancer Maternal Grandmother        leg   Diabetes Maternal Grandmother    Headache Maternal Great-grandmother        "sick headache", probably migraine    Sleep apnea Neg Hx    Sleep disorder Neg Hx     Social History:  reports that she quit smoking about 9 years ago. Her smoking use included cigarettes. She started smoking about 23 years ago. She has a 14 pack-year smoking history. She has never used smokeless tobacco. She reports that she does not currently use alcohol. She reports that she does not use drugs.  ALLERGIES/MEDS:  Allergies:  Allergies  Allergen Reactions   Caffeine Palpitations   Ciprofloxacin Other (See Comments)    Other reaction(s): itching   Codeine Itching   Meperidine Hcl Other (See Comments)    Other reaction(s): hives   Ondansetron Other (See Comments)    Other reaction(s): itching - can take with Benadryl   Phenergan [Promethazine] Other (See Comments)    Other reaction(s): hives   Prednisone Other (See Comments)    Other reaction(s): extreme dizziness   Septra [Sulfamethoxazole-Trimethoprim] Other (See Comments)    Other reaction(s): blurred vision, itching   Phenergan [Promethazine Hcl] Itching and Rash    No medications prior to admission.     ROS  There were no vitals taken for this visit. Physical Exam  No results found for this or any previous visit (from the past 24 hours).  No results found.   ASSESSMENT/PLAN: Mia Rubio is a 43 y.o. G2P1001 who is admitted for ***   Steva Ready, DO

## 2023-03-28 ENCOUNTER — Other Ambulatory Visit: Payer: Self-pay | Admitting: Internal Medicine

## 2023-03-28 NOTE — Telephone Encounter (Signed)
Pt had a telephone visit with Dr. Graciela Husbands, but he wanted pt to come back in 3 months, pt has not done so. Pt was physically seen on 04/09/22 with Dr. Graciela Husbands. Can pt get a year supply of medication corlanor without being physically seen by Dr. Graciela Husbands? Please address

## 2023-04-09 ENCOUNTER — Encounter: Payer: Self-pay | Admitting: Neurology

## 2023-04-09 NOTE — Progress Notes (Signed)
 Surgical Instructions   Your procedure is scheduled on Tuesday, April 16, 2023. Report to [facility] Main Entrance "A" at 9:00 A.M., then check in with the Admitting office. Any questions or running late day of surgery: call [phone]  Questions prior to your surgery date: call [phone], Monday-Friday, 8am-4pm. If you experience any cold or flu symptoms such as cough, fever, chills, shortness of breath, etc. between now and your scheduled surgery, please notify us at the above number.     Remember:  Do not eat after midnight the night before your surgery   You may drink clear liquids until 8:00 the morning of your surgery.   Clear liquids allowed are: Water, Non-Citrus Juices (without pulp), Carbonated Beverages, Clear Tea (no milk, honey, etc.), Black Coffee Only (NO MILK, CREAM OR POWDERED CREAMER of any kind), and Gatorade.  Patient Instructions  The night before surgery:  No food after midnight. ONLY clear liquids after midnight  The day of surgery:  Drink ONE (1) Pre-Surgery Clear Ensure by 8:00 the morning of surgery. Drink in one sitting. Do not sip.  This drink was given to you during your hospital pre-op appointment visit.  Nothing else to drink after completing the Pre-Surgery Clear Ensure.         If you have questions, please contact your surgeon's office.     Take these medicines the morning of surgery with A SIP OF WATER   ALPRAZolam (XANAX XR)  Atogepant (QULIPTA)  buPROPion (WELLBUTRIN SR) ipratropium (ATROVENT)  midodrine (PROAMATINE)  venlafaxine XR [facility])     May take these medicines IF NEEDED: acetaminophen (TYLENOL 8 HOUR)  ALPRAZolam (XANAX)  dicyclomine (BENTYL)  fluticasone (FLONASE)  ondansetron (ZOFRAN-ODT)     One week prior to surgery, STOP taking any Aspirin (unless otherwise instructed by your surgeon) Aleve, Naproxen, Motrin, Goody's, BC's, all herbal medications, fish oil, and non-prescription vitamins. This includes  your ibuprofen (ADVIL).                     Do NOT Smoke (Tobacco/Vaping) for 24 hours prior to your procedure.  If you use a CPAP at night, you may bring your mask/headgear for your overnight stay.   You will be asked to remove any contacts, glasses, piercing's, hearing aid's, dentures/partials prior to surgery. Please bring cases for these items if needed.    Patients discharged the day of surgery will not be allowed to drive home, and someone needs to stay with them for 24 hours.  SURGICAL WAITING ROOM VISITATION Patients may have no more than 2 support people in the waiting area - these visitors may rotate.   Pre-op nurse will coordinate an appropriate time for 1 ADULT support person, who may not rotate, to accompany patient in pre-op.  Children under the age of 64 must have an adult with them who is not the patient and must remain in the main waiting area with an adult.  If the patient needs to stay at the hospital during part of their recovery, the visitor guidelines for inpatient rooms apply.  Please refer to the [facility] website for the visitor guidelines for any additional information.   If you received a COVID test during your pre-op visit  it is requested that you wear a mask when out in public, stay away from anyone that may not be feeling well and notify your surgeon if you develop symptoms. If you have been in contact with anyone that has tested positive in the last  10 days please notify you surgeon.      Pre-operative CHG Bathing Instructions   You can play a key role in reducing the risk of infection after surgery. Your skin needs to be as free of germs as possible. You can reduce the number of germs on your skin by washing with CHG (chlorhexidine gluconate) soap before surgery. CHG is an antiseptic soap that kills germs and continues to kill germs even after washing.   DO NOT use if you have an allergy to chlorhexidine/CHG or antibacterial soaps. If your skin becomes  reddened or irritated, stop using the CHG and notify one of our RNs at [phone].              TAKE A SHOWER THE NIGHT BEFORE SURGERY AND THE DAY OF SURGERY    Please keep in mind the following:  DO NOT shave, including legs and underarms, 48 hours prior to surgery.   Place clean sheets on your bed the night before surgery Use a clean washcloth (not used since being washed) for each shower. DO NOT sleep with pet's night before surgery.  CHG Shower Instructions:  Wash your face and private area with normal soap. If you choose to wash your hair, wash first with your normal shampoo.  After you use shampoo/soap, rinse your hair and body thoroughly to remove shampoo/soap residue.  Turn the water OFF and apply half the bottle of CHG soap to a CLEAN washcloth.  Apply CHG soap ONLY FROM YOUR NECK DOWN TO YOUR TOES (washing for 3-5 minutes)  DO NOT use CHG soap on face, private areas, open wounds, or sores.  Pay special attention to the area where your surgery is being performed.  If you are having back surgery, having someone wash your back for you may be helpful. Wait 2 minutes after CHG soap is applied, then you may rinse off the CHG soap.  Pat dry with a clean towel  Put on clean pajamas    Additional instructions for the day of surgery: DO NOT APPLY any lotions, deodorants, or perfumes.   Do not wear jewelry or makeup Do not wear nail polish, gel polish, artificial nails, or any other type of covering on natural nails (fingers and toes) Do not bring valuables to the hospital. [facility] is not responsible for valuables/personal belongings. Put on clean/comfortable clothes.  Please brush your teeth.  Ask your nurse before applying any prescription medications to the skin.

## 2023-04-10 ENCOUNTER — Inpatient Hospital Stay (HOSPITAL_COMMUNITY)
Admission: RE | Admit: 2023-04-10 | Discharge: 2023-04-10 | Disposition: A | Discharge status: Home / Self Care | Payer: Medicaid Other | Source: Ambulatory Visit

## 2023-04-10 NOTE — Progress Notes (Signed)
Surgical Instructions   Your procedure is scheduled on Tuesday, April 16, 2023. Report to [facility] Main Entrance "A" at 9:00 A.M., then check in with the Admitting office. Any questions or running late day of surgery: call [phone]  Questions prior to your surgery date: call [phone], Monday-Friday, 8am-4pm. If you experience any cold or flu symptoms such as cough, fever, chills, shortness of breath, etc. between now and your scheduled surgery, please notify us at the above number.     Remember:  Do not eat after midnight the night before your surgery   You may drink clear liquids until 8:00 the morning of your surgery.   Clear liquids allowed are: Water, Non-Citrus Juices (without pulp), Carbonated Beverages, Clear Tea (no milk, honey, etc.), Black Coffee Only (NO MILK, CREAM OR POWDERED CREAMER of any kind), and Gatorade.    Take these medicines the morning of surgery with A SIP OF WATER   ALPRAZolam (XANAX XR)  Atogepant (QULIPTA)  buPROPion (WELLBUTRIN SR) ipratropium (ATROVENT)  midodrine (PROAMATINE)  venlafaxine XR [facility])     May take these medicines IF NEEDED: acetaminophen (TYLENOL 8 HOUR)  ALPRAZolam (XANAX)  dicyclomine (BENTYL)  fluticasone (FLONASE)  ondansetron (ZOFRAN-ODT)     One week prior to surgery, STOP taking any Aspirin (unless otherwise instructed by your surgeon) Aleve, Naproxen, Motrin, Goody's, BC's, all herbal medications, fish oil, and non-prescription vitamins. This includes your ibuprofen (ADVIL).                     Do NOT Smoke (Tobacco/Vaping) for 24 hours prior to your procedure.  If you use a CPAP at night, you may bring your mask/headgear for your overnight stay.   You will be asked to remove any contacts, glasses, piercing's, hearing aid's, dentures/partials prior to surgery. Please bring cases for these items if needed.    Patients discharged the day of surgery will not be allowed to drive home, and someone needs  to stay with them for 24 hours.  SURGICAL WAITING ROOM VISITATION Patients may have no more than 2 support people in the waiting area - these visitors may rotate.   Pre-op nurse will coordinate an appropriate time for 1 ADULT support person, who may not rotate, to accompany patient in pre-op.  Children under the age of 65 must have an adult with them who is not the patient and must remain in the main waiting area with an adult.  If the patient needs to stay at the hospital during part of their recovery, the visitor guidelines for inpatient rooms apply.  Please refer to the [facility] website for the visitor guidelines for any additional information.   If you received a COVID test during your pre-op visit  it is requested that you wear a mask when out in public, stay away from anyone that may not be feeling well and notify your surgeon if you develop symptoms. If you have been in contact with anyone that has tested positive in the last 10 days please notify you surgeon.      Pre-operative CHG Bathing Instructions   You can play a key role in reducing the risk of infection after surgery. Your skin needs to be as free of germs as possible. You can reduce the number of germs on your skin by washing with CHG (chlorhexidine gluconate) soap before surgery. CHG is an antiseptic soap that kills germs and continues to kill germs even after washing.   DO NOT use if you have  an allergy to chlorhexidine/CHG or antibacterial soaps. If your skin becomes reddened or irritated, stop using the CHG and notify one of our RNs at [phone].              TAKE A SHOWER THE NIGHT BEFORE SURGERY AND THE DAY OF SURGERY    Please keep in mind the following:  DO NOT shave, including legs and underarms, 48 hours prior to surgery.   Place clean sheets on your bed the night before surgery Use a clean washcloth (not used since being washed) for each shower. DO NOT sleep with pet's night before surgery.  CHG Shower  Instructions:  Wash your face and private area with normal soap. If you choose to wash your hair, wash first with your normal shampoo.  After you use shampoo/soap, rinse your hair and body thoroughly to remove shampoo/soap residue.  Turn the water OFF and apply half the bottle of CHG soap to a CLEAN washcloth.  Apply CHG soap ONLY FROM YOUR NECK DOWN TO YOUR TOES (washing for 3-5 minutes)  DO NOT use CHG soap on face, private areas, open wounds, or sores.  Pay special attention to the area where your surgery is being performed.  If you are having back surgery, having someone wash your back for you may be helpful. Wait 2 minutes after CHG soap is applied, then you may rinse off the CHG soap.  Pat dry with a clean towel  Put on clean pajamas    Additional instructions for the day of surgery: DO NOT APPLY any lotions, deodorants, or perfumes.   Do not wear jewelry or makeup Do not wear nail polish, gel polish, artificial nails, or any other type of covering on natural nails (fingers and toes) Do not bring valuables to the hospital. [facility] is not responsible for valuables/personal belongings. Put on clean/comfortable clothes.  Please brush your teeth.  Ask your nurse before applying any prescription medications to the skin.

## 2023-04-13 ENCOUNTER — Other Ambulatory Visit: Payer: Self-pay | Admitting: Neurology

## 2023-04-13 DIAGNOSIS — G43511 Persistent migraine aura without cerebral infarction, intractable, with status migrainosus: Secondary | ICD-10-CM

## 2023-04-15 ENCOUNTER — Telehealth: Payer: Self-pay | Admitting: Internal Medicine

## 2023-04-15 NOTE — Telephone Encounter (Signed)
 Pt called to report that she has been having some worsening POTS for the past month.. she has noticed increased dizziness with position changes and times that her HR has been racing one time up to 180... but it improved with rest.   She has not been checking her BP but aty her Pcp appt it was 120/80.   She has been hydrating and taking her meds.  Last med change:   increase your midorine to every 4 hrs while awake..increase ivabradine to 7.5 bid  Pt will continue to monitor and I will send to Dr Ambrose Pancoast for further recommendations.

## 2023-04-15 NOTE — Telephone Encounter (Signed)
 Pt c/o medication issue:  1. Name of Medication: Ivabradine  2. How are you currently taking this medication (dosage and times per day)?   3. Are you having a reaction (difficulty breathing--STAT)?   4. What is your medication issue? Patient says are symptoms are coming back quite heavily- she wants to know if she needs to  take more of this medicine   STAT if patient feels like he/she is going to faint   1. Are you feeling dizzy, lightheaded, or faint right now? No, not at this time    2. Have you passed out?  no (If yes move to .SYNCOPECHMG)   3. Do you have any other symptoms? Heart rate going up higher than normal when she stands up   4. Have you checked your HR and BP (record if available)?  Heart rate has been as high as 180

## 2023-04-16 ENCOUNTER — Telehealth: Payer: Self-pay | Admitting: Neurology

## 2023-04-16 ENCOUNTER — Inpatient Hospital Stay (HOSPITAL_COMMUNITY)
Admission: RE | Admit: 2023-04-16 | Payer: Medicaid Other | Source: Home / Self Care | Admitting: Obstetrics and Gynecology

## 2023-04-16 ENCOUNTER — Encounter (HOSPITAL_COMMUNITY): Admission: RE | Payer: Self-pay | Source: Home / Self Care

## 2023-04-16 DIAGNOSIS — G90A Postural orthostatic tachycardia syndrome (POTS): Secondary | ICD-10-CM

## 2023-04-16 DIAGNOSIS — N939 Abnormal uterine and vaginal bleeding, unspecified: Secondary | ICD-10-CM

## 2023-04-16 SURGERY — XI ROBOTIC ASSISTED LAPAROSCOPIC HYSTERECTOMY AND SALPINGECTOMY
Anesthesia: Choice | Laterality: Bilateral

## 2023-04-16 NOTE — Telephone Encounter (Signed)
 Mia Rubio from [facility] has called to schedule delivery of Botox. Delivery date is 02-25 content is 200 units, 34 day supply quantity of 1, their call back # is [phone]

## 2023-04-17 ENCOUNTER — Other Ambulatory Visit: Payer: Self-pay | Admitting: Pulmonary Disease

## 2023-04-18 NOTE — Telephone Encounter (Signed)
 One wonders what triggers the variations of it all Does she have any ideas regarding physical or stress related triggers  Can she do orthostatics at home -- would like to see what her BP is doing when she stands as her HR shoots up Thanks SK

## 2023-04-30 ENCOUNTER — Encounter: Payer: Self-pay | Admitting: Neurology

## 2023-04-30 ENCOUNTER — Telehealth: Payer: Self-pay | Admitting: Neurology

## 2023-04-30 NOTE — Telephone Encounter (Signed)
 I called the patient's husband back and he gave the phone to the patient.  She states that her tremors have become significantly worse today.  She states her hands may have been shaking hands prior, but today she reports  full body tremors, also her chest, states its like parkinson's. She states she could hardly walk into her ENT appointment earlier today.  She and her husband looked up info and believes the lamotrigine may be causing the tremor.  Currently she is taking 200 mg at bedtime and she does notice some nausea with it as well as no improvement in her auras.  She would like to come off this medication and see if that helps the tremor.  She is aware that if she has very concerning symptoms, sudden onset, etc. We would recommend the emergency department or urgent care even.  She said at this time she is asking for instructions on how to wean off lamotrigine safely. She was very appreciative for the call back and she said for Dr Lucia Gaskins to disregard the previous message she sent (yesterday). Her Botox appt is next Monday 3/10.

## 2023-04-30 NOTE — Telephone Encounter (Signed)
 Pt sent a mychart message right after. I also called her back. See other encounter from today.

## 2023-04-30 NOTE — Telephone Encounter (Signed)
 I spoke with Dr Lucia Gaskins and told her pt's concerns and symptoms. She is ok with pt coming off lamotrigine and provided instructions for patient to decrease by 50 mg weekly until off. I called the pt back and let her know. Starting tonight she will decrease to 3 tabs nightly x 1 week, then 2 tablets nightly x 1 week, then 1 tab nightly x  1 week then stop. Patient will call us in the interim with any questions or concerns. She will touch base with Dr Lucia Gaskins at botox appt next week and then TGN consultation on 5/6. She was very appreciative for the call.

## 2023-04-30 NOTE — Telephone Encounter (Signed)
 Spouse called to report pt is on seizure medication and he believes that is causing pt full body tremor.  Phone rep asked if pt was being taken to ED, spouse states he is unsure, wanted to be advised by Dr Lucia Gaskins.  Phone rep encouraged him to take pt, and also informed him  per his request a message would be sent asking that he be called back for suggestion from RN, phone rep suggested my chart for a quicker response.  Spouse said they were told to call instead.

## 2023-05-02 ENCOUNTER — Encounter: Payer: Self-pay | Admitting: Internal Medicine

## 2023-05-06 ENCOUNTER — Ambulatory Visit: Payer: Medicaid Other | Admitting: Neurology

## 2023-05-06 DIAGNOSIS — H9193 Unspecified hearing loss, bilateral: Secondary | ICD-10-CM

## 2023-05-06 DIAGNOSIS — H938X9 Other specified disorders of ear, unspecified ear: Secondary | ICD-10-CM

## 2023-05-06 DIAGNOSIS — H838X9 Other specified diseases of inner ear, unspecified ear: Secondary | ICD-10-CM

## 2023-05-06 DIAGNOSIS — G43511 Persistent migraine aura without cerebral infarction, intractable, with status migrainosus: Secondary | ICD-10-CM

## 2023-05-06 DIAGNOSIS — G43009 Migraine without aura, not intractable, without status migrainosus: Secondary | ICD-10-CM

## 2023-05-06 DIAGNOSIS — G43709 Chronic migraine without aura, not intractable, without status migrainosus: Secondary | ICD-10-CM

## 2023-05-06 DIAGNOSIS — H81399 Other peripheral vertigo, unspecified ear: Secondary | ICD-10-CM

## 2023-05-06 DIAGNOSIS — H9319 Tinnitus, unspecified ear: Secondary | ICD-10-CM

## 2023-05-06 DIAGNOSIS — R42 Dizziness and giddiness: Secondary | ICD-10-CM

## 2023-05-06 DIAGNOSIS — R519 Headache, unspecified: Secondary | ICD-10-CM

## 2023-05-06 MED ORDER — ONABOTULINUMTOXINA 200 UNITS IJ SOLR
155.0000 [IU] | Freq: Once | INTRAMUSCULAR | Status: AC
  Administered 2023-05-06: 155 [IU] via INTRAMUSCULAR

## 2023-05-06 NOTE — Progress Notes (Signed)
 05/06/2023: At baseline had migraines daily. She gets >50% relief in migraine frequency. But still have 8 migraine days a month and 15 headache days a month > 3 months despite her great improvement with botox. On emgality is further reduced in migraine freq and severity to 6 migraine days a month and < 10 total headache days a month. Nurtec helps. Toradol helps for severe migraines.   Patient feels that her migraines cause her jaw to ache in her jaw aching also can cause her migraines to worsen it is a trigger for migraines included 5 units in each masseter to see if that helps with migraine severity.  Will order a CT temporal bone ot evaluate for superior canal dehiscense syndrome given her symptoms of dizziness,vertigo,oscillopsia, aural fullness, eye movement autophony(could this be the zaps she states when her eyes move?)  Orders Placed This Encounter  Procedures   CT TEMPORAL BONES WO CONTRAST   02/11/2023: stable, doing great as far as decreased migraine freq and severity > 50% but still has auras.Patient feels that her migraines cause her jaw to ache in her jaw aching also can cause her migraines to worsen it is a trigger for migraines included 10 units in each masseter to see if that helps with migraine severity.   She gets zaps when she moves her eyes to the left and right. Electric shocks into the tongue and lips. Getting worse. Mor on the left side into the eye mostly V1 area but also into V2 and V3. Shooting back and ear hurts Could order an MRI trigeminal protocol. She is seeing neurosurgery and if they don;t perform any testing we can order MRI brain w/ trigeminal protocol and consider CTA and MRI c-spine Lamictal (lamotrigine) is considered a potential treatment option for migraine with aura, with studies showing it can effectively reduce the frequency and severity of aura symptoms in many patients, making it a possible preventative medication for those experiencing migraine aura  episodes regularly;   Meds ordered this encounter  Medications   botulinum toxin Type A (BOTOX) injection 155 Units      11/14/2022: discussed allodynia in migraines she is stable but has an aura often and allodynia discussed options including  changing emgality to qulipta(gave samples she will qulipta along with emgality and see if she improves then possibly consider discontinuing Emgality). Gave Samples: Start Qulipta for 3 weeks and let me know how you are doing Can think about changing Emgality to Vyepti she has > 50% improvement but still has 15 total headache days and 8 migraines a month without aura, no medication overuse or aura with these She does have concomitant migraines with aura without headaches. Discussed the role of Magnesium in migraine with aura. Discussed trying Lamictal which is good with migraine aura but not headache but she suffers from migraine aura alone almost every day Can continue emgality and try qulipta but will try and get vyepti approved and think about trying magnesium and lamictal for migraine   Sent to patient:  11/14/2022: discussed allodynia in migraines she is stable but has an aura often and allodynia discussed options including  changing emgality to qulipta(gave samples you may take qulipta daily along with emgality and see if you improve then possibly consider discontinuing Emgality can give me an update). Gave Samples: Start Qulipta for 3 weeks and let me know how you are doing Can think about changing Emgality to [facility], will try and get Vyepti approved and if so can discuss further  how to incorporate this and what we may chose to stop Also Discussed the role of Magnesium in migraine with aura. Discussed trying Lamictal which is good with migraine aura but not headache but she suffers from migraine aura alone almost every day 5. Increase venlafaxine 6. Continue nurtec once dailyas needed  Magnesium doses depending on which one you take(pick one) and  other OTC medications you can try: 200 mg mag citrate 1-2x daily OR 400 mg mag oxide 1-2x daily OR Mag sulfate 400 to 600 mg daily(can   Other optional vitamin supplementation that may work includes 1000 international units AND vitamin D3 daily AND 500 to 1000 mg of B12 daily AND Riboflavin 400 mg a day AND 150 to 300 mg co-Q10 daily Meds ordered this encounter  Medications   botulinum toxin Type A (BOTOX) injection 155 Units      08/22/2022: At baseline had migraines daily. She gets >50% relief in migraine frequency. But still have 8 migraine days a month and 15 headache days a month > 3 months despite her great improvement with botox. Will try emgality.   05/29/2022: stable  03/05/2022: stable doing great  11/30/2021: Prior to botox daily headaches and most of the month daily migraines maybe even daily. Botox has changed her life. She has a lot of light sensitivity. But even the light sensitivity is better, she wears FL-41. After botox has 6 evere migraine days a month and < 14 total headache days a month significant >>50% improvement on botox in frequ and severity. Failed ubrelvy, imitrex, rizatriptan, zomig. Can try ubrelvy if nurtec not approved. Affects her ability to drive, light sensitivity affects her ability to go outside, filled out a car tinting form, still has 6 -7 severe migraine days a month and headaches, has POTS with fatigue and exercise instability.  She cannot work 8 hours straight without restm migraines exacerbated by fatigue, lights, exertion, significantly impacting her life despite improvement by botox. Higher incidence of POTS with migraines.   09/07/2021: Botox is working well.  06/14/2021: Reports botox working well. Two weeks late for botox so she has had more migraines. 1/ week migraine when taking botox consistently. Never severe.    BOTOX PROCEDURE NOTE FOR MIGRAINE HEADACHE    Contraindications and precautions discussed with patient(above). Aseptic procedure  was observed and patient tolerated procedure. Procedure performed by Butch Penny, NP  The condition has existed for more than 6 months, and pt does not have a diagnosis of ALS, Myasthenia Gravis or Lambert-Eaton Syndrome.  Risks and benefits of injections discussed and pt agrees to proceed with the procedure.  Written consent obtained  These injections are medically necessary. These injections do not cause sedations or hallucinations which the oral therapies may cause.  Indication/Diagnosis: chronic migraine  Description of procedure:  The patient was placed in a sitting position. The standard protocol was used for Botox as follows, with 5 units of Botox injected at each site:   -Procerus muscle, midline injection  -Corrugator muscle, bilateral injection  -Frontalis muscle, bilateral injection, with 2 sites each side, medial injection was performed in the upper one third of the frontalis muscle, in the region vertical from the medial inferior edge of the superior orbital rim. The lateral injection was again in the upper one third of the forehead vertically above the lateral limbus of the cornea, 1.5 cm lateral to the medial injection site.  -Temporalis muscle injection, 4 sites, bilaterally. The first injection was 3 cm above the tragus of the  ear, second injection site was 1.5 cm to 3 cm up from the first injection site in line with the tragus of the ear. The third injection site was 1.5-3 cm forward between the first 2 injection sites. The fourth injection site was 1.5 cm posterior to the second injection site.  -Occipitalis muscle injection, 3 sites, bilaterally. The first injection was done one half way between the occipital protuberance and the tip of the mastoid process behind the ear. The second injection site was done lateral and superior to the first, 1 fingerbreadth from the first injection. The third injection site was 1 fingerbreadth superiorly and medially from the first  injection site.  -Cervical paraspinal muscle injection, 2 sites, bilateral knee first injection site was 1 cm from the midline of the cervical spine, 3 cm inferior to the lower border of the occipital protuberance. The second injection site was 1.5 cm superiorly and laterally to the first injection site.  -Trapezius muscle injection was performed at 3 sites, bilaterally. The first injection site was in the upper trapezius muscle halfway between the inflection point of the neck, and the acromion. The second injection site was one half way between the acromion and the first injection site. The third injection was done between the first injection site and the inflection point of the neck.   Will return for repeat injection in 3 months.   A 200 units of Botox was used, 155 units were injected, 45Uof the Botox was wasted. The patient tolerated the procedure well, there were no complications of the above procedure.  Naomie Dean, MD [facility] [address] [place], [place] [address] [phone]

## 2023-05-06 NOTE — Progress Notes (Signed)
 Botox- 200 units x 1 vial Lot: [id number] Expiration: 07/2025 NDC: 1610-9604-54  Bacteriostatic 0.9% Sodium Chloride- * mL  Lot: [id number] Expiration: 12/28/2023 NDC: 1478-2956-21  Dx: H08.657 S/P Witnessed by Alverda Skeans, RN

## 2023-05-06 NOTE — Patient Instructions (Addendum)
 Hearing your eyes move, a phenomenon called eye movement autophony, is a symptom of Superior Canal Dehiscence Syndrome (SCDS), a disorder where there's a hole in the bone overlying the superior semicircular canal, an inner ear structure.  Here's a more detailed explanation: What is SCDS? SCDS is a condition where there's an opening (dehiscence) in the bone that normally covers the superior semicircular canal, a part of the inner ear responsible for balance.  How does it cause hearing eye movements? This opening allows abnormal sound transmission, making internal sounds like heartbeat, eye movements, or even breathing, abnormally loud.  Other symptoms of SCDS: Besides hearing eye movements, other symptoms include: Vertigo or dizziness  Oscillopsia (the sensation that objects are moving or vibrating when they are not)  Disequilibrium (difficulty maintaining balance)  Conductive hearing loss  Increased sensitivity to bone-conducted sounds (hyperacusis)  Tinnitus (ringing in the ears)  Aural fullness (feeling of fullness in the ear)  Diagnosis: A CT scan of the temporal bone can help diagnose SCDS, and a vestibular evoked myogenic potential (VEMP) test can confirm the diagnosis.  Treatment: Treatment options for SCDS include avoiding triggers, hearing aids, and in some cases, surgery to plu

## 2023-05-08 ENCOUNTER — Telehealth: Payer: Self-pay | Admitting: Internal Medicine

## 2023-05-08 ENCOUNTER — Telehealth: Payer: Self-pay | Admitting: Neurology

## 2023-05-08 NOTE — Telephone Encounter (Signed)
 Spoke with spouse per DPR and he states patient nervous system in shock. She has tremors and her sight is out of whack. Asked to explain and he states that is how wife is describing it. He wanted a medication called in that will help her nervous system. Informed spouse Dr. Graciela Husbands is out of office. I will speak with DOD and give them a call back.   Spoke with DOD he recommends if neuro is not able to get her an appointment and being that her symptoms are not specific she should go to ED.  Left voicemail to return call to office

## 2023-05-08 NOTE — Telephone Encounter (Signed)
 LVM and sent mychart msg offering appointment with Dr. Lucia Gaskins for 06/12/23 at 3:30pm

## 2023-05-08 NOTE — Telephone Encounter (Signed)
 Husband called in patients nervous systems being in shock. Looking to see if the dr can call the patient in any medication. Please advise

## 2023-05-13 ENCOUNTER — Other Ambulatory Visit: Payer: Self-pay | Admitting: Neurology

## 2023-05-13 ENCOUNTER — Telehealth: Payer: Self-pay | Admitting: Neurology

## 2023-05-13 ENCOUNTER — Other Ambulatory Visit (HOSPITAL_COMMUNITY): Payer: Self-pay

## 2023-05-13 DIAGNOSIS — G43711 Chronic migraine without aura, intractable, with status migrainosus: Secondary | ICD-10-CM

## 2023-05-13 DIAGNOSIS — G43109 Migraine with aura, not intractable, without status migrainosus: Secondary | ICD-10-CM

## 2023-05-13 MED ORDER — BD LUER-LOK SYRINGE 25G X 1" 3 ML MISC
0 refills | Status: DC
Start: 2023-05-13 — End: 2023-10-23
  Filled 2023-05-13: qty 5, 5d supply, fill #0

## 2023-05-13 MED ORDER — KETOROLAC TROMETHAMINE 60 MG/2ML IM SOLN
INTRAMUSCULAR | 0 refills | Status: DC
  Filled 2023-05-13: qty 10, 3d supply, fill #0

## 2023-05-13 NOTE — Telephone Encounter (Signed)
 CT Temporal wo contrast sent to GI for scheduling.  Auth#: [id number] (05/13/23-06/27/23)

## 2023-05-15 ENCOUNTER — Ambulatory Visit: Attending: Internal Medicine | Admitting: Internal Medicine

## 2023-05-15 VITALS — BP 126/93 | HR 110 | Ht 69.0 in | Wt 193.8 lb

## 2023-05-15 DIAGNOSIS — I471 Supraventricular tachycardia, unspecified: Secondary | ICD-10-CM

## 2023-05-15 DIAGNOSIS — G90A Postural orthostatic tachycardia syndrome (POTS): Secondary | ICD-10-CM | POA: Diagnosis not present

## 2023-05-15 MED ORDER — IVABRADINE HCL 5 MG PO TABS: 7.5000 mg | ORAL_TABLET | Freq: Two times a day (BID) | ORAL | Status: DC

## 2023-05-15 MED ORDER — MIDODRINE HCL 5 MG PO TABS
7.5000 mg | ORAL_TABLET | Freq: Three times a day (TID) | ORAL | 1 refills | Status: AC
Start: 2023-05-15 — End: ?

## 2023-05-15 NOTE — Telephone Encounter (Signed)
 Pt has had OV with Dr Graciela Husbands today.  See Dr Odessa Fleming note for complete details.

## 2023-05-15 NOTE — Patient Instructions (Addendum)
 Medication Instructions:  Your physician has recommended you make the following change in your medication:   ** Increase ProAmatine from 5mg  - 1 tablet three times daily to 7.5mg  - 1.5 tablets by mouth three times daily.  *If you need a refill on your cardiac medications before your next appointment, please call your pharmacy*   Lab Work: None ordered.  If you have labs (blood work) drawn today and your tests are completely normal, you will receive your results only by: MyChart Message (if you have MyChart) OR A paper copy in the mail If you have any lab test that is abnormal or we need to change your treatment, we will call you to review the results.   Testing/Procedures: None ordered.    Follow-Up: At [facility], you and your health needs are our priority.  As part of our continuing mission to provide you with exceptional heart care, we have created designated Provider Care Teams.  These Care Teams include your primary Cardiologist (physician) and Advanced Practice Providers (APPs -  Physician Assistants and Nurse Practitioners) who all work together to provide you with the care you need, when you need it.  We recommend signing up for the patient portal called "MyChart".  Sign up information is provided on this After Visit Summary.  MyChart is used to connect with patients for Virtual Visits (Telemedicine).  Patients are able to view lab/test results, encounter notes, upcoming appointments, etc.  Non-urgent messages can be sent to your provider as well.   To learn more about what you can do with MyChart, go to [email or web address].    Your next appointment:   4 week telephone visit with Dr Graciela Husbands

## 2023-05-15 NOTE — Progress Notes (Signed)
 Patient Care Team: Stamey, Verda Cumins, FNP as PCP - General (Family Medicine) Cain Saupe, MD as Referring Physician (Family Medicine)   HPI  Mia Rubio is a 43 y.o. female Seen in follow-up POTS  Her dizziness in the past has been recumbent associated with normal vital signs which to me suggested a noncardiac perhaps neuropsychiatric origin. It was recommended that she be referred for consideration of these components. There also was a component of heat and orthostatic intolerance.  An abdominal binder helped considerably    She is here having lost 2 children miscarriages in the last 6 months.  The first was associated with profound and persistent bleeding and resulted in exercise intolerance bedrest and profound dizziness.  She recalls that when she had hormonal suppression of her periods she felt the best that she had in years.  She and her husband are continuing to try to have a second child.  Hence got pregnant again and she miscarried her second child about 6 months ago.  That is also the timeframe in which she began to have significantly worsened symptoms.  When seen 3/19, >>"She says she is barely able to sit up without being dizzy.  She is not able to stand well.  Fluorescent lights are bothersome.  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    Complains of shortness of breath and since then has had worsening complaints of dyspnea, some chest pain including something that took her to the ER, exercise intolerance fatigue and sleepiness.  Her atenolol has been gradually uptitrated because of recurrent SVT.  Functionally limited.  She describes sleeping many hours of the day.  Minimal activity results in extreme fatigue.  Dyspnea with activity.  Recurrent  palpitations.  Has a variety of unusual complaints.  She has migraine headaches, but also has "zapping in her eyes "that she feels throughout her face down to her tongue.  Frequently these are associated with a sensation that her heart is beating out of her chest as she is almost crawling out of her skin.  When she takes her Xanax these last symptoms are largely quiescent  She has discussed this with neurology as well as her PCP but continues to struggle with these complaints without resolution.  She asks today about [facility] in [place].  I also suggested that she consider having her neurologist refer her perhaps to [facility] which has expertise not only in general neurology but autonomic neurology as well.  It is her impression that her symptoms are worse since her ablation 2 years ago for AV node reentry.  Her psychotropic medications have been prescribed 1 by GYN and 1 by neurology.  It is her impression that the Wellbutrin has made her worse, it is her husband's impression that her Wellbutrin has made her less irritable.  She has struggled with menses intolerance.  She was switched a couple years ago to the progesterone only hormone replacement therapy, Slynd, because of concerns of stroke in the context of her migrainous headaches they are associated with an aura.  Infrequent recurrent syncope with not very much prodrome.  She is using isometric efforts to protect but not currently using compression    Date Cr K Hgb  3/21  0.81  4.3   7/23  0.94 4.0 14.9  12/24 0.94 3.7 15.6       Past Medical History:  Diagnosis Date   Common migraine with intractable migraine 12/19/2017   Hypotension    Low blood pressure    Panic disorder 12/19/2017   "something that happens when I have a flare" regarding POTS   POTS (postural orthostatic tachycardia syndrome)    SVT (supraventricular tachycardia) (HCC)    Tachycardia     Past Surgical  History:  Procedure Laterality Date   APPENDECTOMY     BREAST EXCISIONAL BIOPSY Right    age 69   BREAST SURGERY     CESAREAN SECTION N/A 08/19/2014   Procedure: CESAREAN SECTION;  Surgeon: Myna Hidalgo, DO;  Location: [facility];  Service: Obstetrics;  Laterality: N/A;   DILATION AND EVACUATION N/A 11/24/2016   Procedure: DILATATION AND EVACUATION;  Surgeon: Kirkland Hun, MD;  Location: [facility];  Service: Gynecology;  Laterality: N/A;   FOOT SURGERY  2011 2012   KNEE SURGERY  2001  2002   plantar fasciitis     SVT ABLATION N/A 08/22/2021   Procedure: SVT ABLATION;  Surgeon: Marinus Maw, MD;  Location: [facility] INVASIVE CV LAB;  Service: Cardiovascular;  Laterality: N/A;    Current Outpatient Medications  Medication Sig Dispense Refill   acetaminophen (TYLENOL 8 HOUR) 650 MG CR tablet 2 tablets as needed     ALPRAZolam (XANAX XR) 1 MG 24 hr tablet Take 1 mg by mouth daily.     ALPRAZolam (XANAX) 0.5 MG tablet Take 0.5 mg by mouth daily at 6 PM. PRN     botulinum toxin Type A (BOTOX) 200 units injection INJECT 155 UNITS INTRAMUSCULARLY TO HEAD AND NECK EVERY 12 WEEKS  (DISCARD UNUSED AFTER FIRST USE) 1 each 3   buPROPion (WELLBUTRIN SR) 150 MG 12 hr tablet Take 150 mg by mouth every morning.     dicyclomine (BENTYL) 20 MG tablet Take 20 mg by mouth daily as needed for spasms.     fluticasone (FLONASE) 50 MCG/ACT nasal spray Place 1 spray into both nostrils as needed.     Galcanezumab-gnlm (EMGALITY) 120 MG/ML SOAJ Inject 120 mg into the skin every 30 (thirty) days. 1.12 mL 11   ibuprofen (ADVIL) 800 MG tablet TAKE 1 TABLET(800 MG) BY MOUTH THREE TIMES DAILY AS NEEDED 90 tablet 11   ipratropium (ATROVENT) 0.06 % nasal spray 2 sprays in each nostril     ivabradine (CORLANOR) 5 MG TABS tablet TAKE 1 AND 1/2 TABLETS BY MOUTH TWICE DAILY, WITH MEALS 270 tablet 0   ketorolac (TORADOL) 60 MG/2ML SOLN injection Inject 1-68ml (30-60mg ) intramuscularly at onset of migraine. May repeat in 6 hours. Max  twice a day and 4 days per month. 10 mL 0   meclizine (ANTIVERT) 25 MG tablet TAKE 1 TABLET(25 MG) BY MOUTH THREE TIMES DAILY AS NEEDED FOR DIZZINESS 30 tablet 0   midodrine (PROAMATINE) 5 MG tablet Take 1 tablet (5 mg total) by mouth 3 (three) times daily with meals. 270 tablet 3   Multiple Vitamin (MULTIVITAMIN PO) Take 2 tablets by mouth at bedtime.     ondansetron (ZOFRAN-ODT) 4 MG disintegrating tablet Take 1 tablet (4 mg total) by mouth every 8 (eight) hours as needed for nausea or vomiting (may take with benadryl if you have itching). 20 tablet 0   Potassium Citrate,Elemental K, 99 MG CAPS      Rimegepant Sulfate (NURTEC) 75 MG TBDP Take 1 tablet (75  mg total) by mouth daily as needed (Migraines). 16 tablet 3   rizatriptan (MAXALT-MLT) 10 MG disintegrating tablet Take 1 tablet (10 mg total) by mouth as needed for migraine. May repeat in 2 hours if needed. Do not exceed 2 tablets in 24 hours. Do not take more than 2-3 times per week. 9 tablet 5   SLYND 4 MG TABS Take 4 mg by mouth daily.     SYRINGE-NEEDLE, DISP, 3 ML (B-D 3CC LUER-LOK SYR 25GX1") 25G X 1" 3 ML MISC Use with ketorolac 5 each 0   UNABLE TO FIND 1 each at bedtime. Med Name: magnesium at night for migraine/sleep     venlafaxine XR (EFFEXOR-XR) 150 MG 24 hr capsule Take 1 capsule (150 mg total) by mouth daily with breakfast. Take with 75 mg for total of 225 mg daily. 90 capsule 11   venlafaxine XR (EFFEXOR-XR) 75 MG 24 hr capsule Take 1 capsule (75 mg total) by mouth daily with breakfast. Take with 150 mg for total of 225 mg daily. 90 capsule 11   zolpidem (AMBIEN) 10 MG tablet Take 10 mg by mouth at bedtime.     No current facility-administered medications for this visit.    Allergies  Allergen Reactions   Caffeine Palpitations   Ciprofloxacin Other (See Comments)    Other reaction(s): itching   Codeine Itching   Meperidine Hcl Other (See Comments)    Other reaction(s): hives   Ondansetron Other (See Comments)    Other  reaction(s): itching - can take with Benadryl   Phenergan [Promethazine] Other (See Comments)    Other reaction(s): hives   Prednisone Other (See Comments)    Other reaction(s): extreme dizziness   Septra [Sulfamethoxazole-Trimethoprim] Other (See Comments)    Other reaction(s): blurred vision, itching   Phenergan [Promethazine Hcl] Itching and Rash      Review of Systems negative except from HPI and PMH  Physical Exam There were no vitals taken for this visit. Well developed and nourished in no acute distress HENT normal Neck   No Clubbing cyanosis edema Skin-warm and dry A & Oriented  Grossly normal sensory and motor function  ECG sinus at 82 Intervals 13/09/35   Assessment and  Plan  Syncope  Swelling dyspnea on exertion  Orthostatic and heat intolerance  Postural Tachycardia  Nonpositional tachycardia  Elevated blood pressure  Miscarriages--multiple  Anxiety  SVT status post ablation with subsequent dysautonomic decompensation  She is off of her beta-blockers but I think it would be important to resume.  However do not want me to make changes at 1 time.  Will have her increase her ProAmatine from 5--7.5.  I will look at maximum doses of ivabradine.  We also broached the possibility of clonidine as a central sympathomimetic  I asked her to follow-up with her gynecologist regarding alternatives to [facility] the progesterone only birth control which is antimineralocorticoid activity in it.  Not great for people with POTS.  There are concerns however regarding estrogen containing contraceptives because of her auras with her migraines.  Suggested, with "zaps in her eyes "that she go back to her neuro-ophthalmology.  Sure to find a psychiatrist to help with managing her psychotropic medications and some of them are coming gynecology and others are coming from neurology.  RECURRENT syncope consider reinstituting compressoin     62 min was spent in care of the  patient including the review of records advising and recroding of work

## 2023-05-17 NOTE — Telephone Encounter (Signed)
 Correct, wanting to make one med change at a time Thanks SK

## 2023-05-21 ENCOUNTER — Ambulatory Visit: Admitting: Internal Medicine

## 2023-05-23 ENCOUNTER — Ambulatory Visit
Admission: RE | Admit: 2023-05-23 | Discharge: 2023-05-23 | Disposition: A | Discharge status: Home / Self Care | Source: Ambulatory Visit | Attending: Neurology

## 2023-05-23 DIAGNOSIS — H938X9 Other specified disorders of ear, unspecified ear: Secondary | ICD-10-CM

## 2023-05-23 DIAGNOSIS — H838X9 Other specified diseases of inner ear, unspecified ear: Secondary | ICD-10-CM

## 2023-05-23 DIAGNOSIS — R42 Dizziness and giddiness: Secondary | ICD-10-CM

## 2023-05-23 DIAGNOSIS — H9193 Unspecified hearing loss, bilateral: Secondary | ICD-10-CM

## 2023-05-23 DIAGNOSIS — H81399 Other peripheral vertigo, unspecified ear: Secondary | ICD-10-CM

## 2023-05-23 DIAGNOSIS — H9319 Tinnitus, unspecified ear: Secondary | ICD-10-CM

## 2023-05-28 ENCOUNTER — Encounter: Payer: Self-pay | Admitting: Neurology

## 2023-05-30 ENCOUNTER — Encounter: Payer: Self-pay | Admitting: Nurse Practitioner

## 2023-05-30 DIAGNOSIS — Z1231 Encounter for screening mammogram for malignant neoplasm of breast: Secondary | ICD-10-CM

## 2023-05-31 ENCOUNTER — Other Ambulatory Visit: Payer: Self-pay

## 2023-05-31 ENCOUNTER — Ambulatory Visit: Admitting: Internal Medicine

## 2023-05-31 ENCOUNTER — Encounter: Payer: Self-pay | Admitting: Internal Medicine

## 2023-05-31 VITALS — BP 126/70 | HR 76 | Temp 97.6°F | Resp 16 | Ht 69.0 in | Wt 196.0 lb

## 2023-05-31 DIAGNOSIS — J3089 Other allergic rhinitis: Secondary | ICD-10-CM | POA: Diagnosis not present

## 2023-05-31 DIAGNOSIS — L281 Prurigo nodularis: Secondary | ICD-10-CM

## 2023-05-31 DIAGNOSIS — R21 Rash and other nonspecific skin eruption: Secondary | ICD-10-CM | POA: Diagnosis not present

## 2023-05-31 MED ORDER — TRIAMCINOLONE ACETONIDE 0.1 % EX OINT: TOPICAL_OINTMENT | CUTANEOUS | 1 refills | Status: AC

## 2023-05-31 NOTE — Progress Notes (Signed)
 NEW PATIENT  Date of Service/Encounter:  05/31/23  Consult requested by: Henrine Screws, MD   Subjective:   Mia Rubio (DOB: 1980/10/13) is a 43 y.o. female who presents to the clinic on 05/31/2023 with a chief complaint of Advice Only .    History obtained from: chart review and patient.   Rash: Onset 2-3 years ago but worsened in the last year.   Reports itchy raised bumps on arms and upper back.  They are not hives.   Tried Hydrocortisone PRN with minimal relief. Also has keratosis pilaris.  Uses Cetaphil to moisturize.  Very itchy and even scratching at nighttime, wakes up with blood on sheets.  She is worried about allergies as a cause.  Also reports she is working on getting a referral to see Derm.    Rhinitis:  Started many years ago  Symptoms include: nasal congestion, rhinorrhea, and post nasal drainage  Occurs year-round Potential triggers: not sure  Treatments tried:  Flonase PRN Sudafed PRN  Previous allergy testing: yes, 10 years ago, positive to pollens/cats/dust mites  History of sinus surgery: no Nonallergic triggers: none   Reviewed:  05/15/2023: seen by Dr Graciela Husbands Cardiology for POTS , on midodrine, ivabradine, beta blocker.   04/30/2023: seen by ENT Dr Pollyann Kennedy for shooting sensation from eyes to maxilla, nasal congestion and discharge. Normal HEENT exam.   Sinus CT 05/28/2023:  1. Normally aerated paranasal sinuses. Patent sinus drainage pathways. 2. Nasal septum bows 4 mm towards the right.   05/06/2023: followed by Neuro for migraines, on botox injections.  Past Medical History: Past Medical History:  Diagnosis Date   Common migraine with intractable migraine 12/19/2017   Hypotension    Low blood pressure    Panic disorder 12/19/2017   "something that happens when I have a flare" regarding POTS   POTS (postural orthostatic tachycardia syndrome)    SVT (supraventricular tachycardia) (HCC)    Tachycardia     Past Surgical History: Past  Surgical History:  Procedure Laterality Date   APPENDECTOMY     BREAST EXCISIONAL BIOPSY Right    age 39   BREAST SURGERY     CESAREAN SECTION N/A 08/19/2014   Procedure: CESAREAN SECTION;  Surgeon: Myna Hidalgo, DO;  Location: [facility];  Service: Obstetrics;  Laterality: N/A;   DILATION AND EVACUATION N/A 11/24/2016   Procedure: DILATATION AND EVACUATION;  Surgeon: Kirkland Hun, MD;  Location: [facility];  Service: Gynecology;  Laterality: N/A;   FOOT SURGERY  2011 2012   KNEE SURGERY  2001  2002   plantar fasciitis     SVT ABLATION N/A 08/22/2021   Procedure: SVT ABLATION;  Surgeon: Marinus Maw, MD;  Location: [facility] INV[facility] CV LAB;  Service: Cardiovascular;  Laterality: N/A;    Family History: Family History  Problem Relation Age of Onset   Heart murmur Father    Cancer Maternal Grandmother        leg   Diabetes Maternal Grandmother    Headache Maternal Great-grandmother        "sick headache", probably migraine    Sleep apnea Neg Hx    Sleep disorder Neg Hx     Social History:  Flooring in bedroom: carpet Pets: none Tobacco use/exposure: 2002-2016, 1ppd Job: none  Medication List:  Allergies as of 05/31/2023       Reactions   Caffeine Palpitations   Ciprofloxacin Other (See Comments)   Other reaction(s): itching   Codeine Itching   Meperidine Hcl Other (See Comments)  Other reaction(s): hives   Ondansetron Other (See Comments)   Other reaction(s): itching - can take with Benadryl   Phenergan [promethazine] Other (See Comments)   Other reaction(s): hives   Prednisone Other (See Comments)   Other reaction(s): extreme dizziness   Septra [sulfamethoxazole-trimethoprim] Other (See Comments)   Other reaction(s): blurred vision, itching   Phenergan [promethazine Hcl] Itching, Rash        Medication List        Accurate as of May 31, 2023  4:22 PM. If you have any questions, ask your nurse or doctor.          STOP taking these medications     buPROPion 150 MG 12 hr tablet Commonly known as: WELLBUTRIN SR Stopped by: Birder Robson       TAKE these medications    ALPRAZolam 1 MG 24 hr tablet Commonly known as: XANAX XR Take 1 mg by mouth daily. What changed: Another medication with the same name was removed. Continue taking this medication, and follow the directions you see here. Changed by: Birder Robson   B-D 3CC LUER-LOK SYR 25GX1" 25G X 1" 3 ML Misc Generic drug: SYRINGE-NEEDLE (DISP) 3 ML Use with ketorolac   Botox 200 units injection Generic drug: botulinum toxin Type A INJECT 155 UNITS INTRAMUSCULARLY TO HEAD AND NECK EVERY 12 WEEKS  (DISCARD UNUSED AFTER FIRST USE)   dicyclomine 20 MG tablet Commonly known as: BENTYL Take 20 mg by mouth daily as needed for spasms.   Emgality 120 MG/ML Soaj Generic drug: Galcanezumab-gnlm Inject 120 mg into the skin every 30 (thirty) days.   fluticasone 50 MCG/ACT nasal spray Commonly known as: FLONASE Place 1 spray into both nostrils as needed.   hydrocortisone 2.5 % cream Apply 1 Application topically 2 (two) times daily.   ibuprofen 800 MG tablet Commonly known as: ADVIL TAKE 1 TABLET(800 MG) BY MOUTH THREE TIMES DAILY AS NEEDED   ipratropium 0.06 % nasal spray Commonly known as: ATROVENT 2 sprays in each nostril   ivabradine 5 MG Tabs tablet Commonly known as: CORLANOR Take 1.5 tablets (7.5 mg total) by mouth 2 (two) times daily with a meal.   ketorolac 60 MG/2ML Soln injection Commonly known as: TORADOL Inject 1-88ml (30-60mg ) intramuscularly at onset of migraine. May repeat in 6 hours. Max twice a day and 4 days per month.   meclizine 25 MG tablet Commonly known as: ANTIVERT TAKE 1 TABLET(25 MG) BY MOUTH THREE TIMES DAILY AS NEEDED FOR DIZZINESS   midodrine 5 MG tablet Commonly known as: PROAMATINE Take 1.5 tablets (7.5 mg total) by mouth 3 (three) times daily with meals.   MULTIVITAMIN PO Take 2 tablets by mouth at bedtime.   Nurtec 75 MG  Tbdp Generic drug: Rimegepant Sulfate Take 1 tablet (75 mg total) by mouth daily as needed (Migraines).   ondansetron 4 MG disintegrating tablet Commonly known as: ZOFRAN-ODT Take 1 tablet (4 mg total) by mouth every 8 (eight) hours as needed for nausea or vomiting (may take with benadryl if you have itching).   Potassium Citrate(Elemental K) 99 MG Caps   rizatriptan 10 MG disintegrating tablet Commonly known as: MAXALT-MLT Take 1 tablet (10 mg total) by mouth as needed for migraine. May repeat in 2 hours if needed. Do not exceed 2 tablets in 24 hours. Do not take more than 2-3 times per week.   Slynd 4 MG Tabs Generic drug: Drospirenone Take 4 mg by mouth daily.   Spiriva Respimat 1.25 MCG/ACT  Aers Generic drug: Tiotropium Bromide Monohydrate Inhale 2 puffs into the lungs as needed.   Symbicort 160-4.5 MCG/ACT inhaler Generic drug: budesonide-formoterol Inhale 2 puffs into the lungs 2 (two) times daily.   Tylenol 8 Hour 650 MG CR tablet Generic drug: acetaminophen 2 tablets as needed   UNABLE TO FIND 1 each at bedtime. Med Name: magnesium at night for migraine/sleep   venlafaxine XR 150 MG 24 hr capsule Commonly known as: EFFEXOR-XR Take 1 capsule (150 mg total) by mouth daily with breakfast. Take with 75 mg for total of 225 mg daily.   venlafaxine XR 75 MG 24 hr capsule Commonly known as: EFFEXOR-XR Take 1 capsule (75 mg total) by mouth daily with breakfast. Take with 150 mg for total of 225 mg daily.   zolpidem 10 MG tablet Commonly known as: AMBIEN Take 10 mg by mouth at bedtime.         REVIEW OF SYSTEMS: Pertinent positives and negatives discussed in HPI.   Objective:   Physical Exam: BP 126/70 (BP Location: Right Arm, Patient Position: Sitting, Cuff Size: Normal)   Pulse 76   Temp 97.6 F (36.4 C) (Temporal)   Resp 16   Ht 5\' 9"  (1.753 m)   Wt 196 lb (88.9 kg)   SpO2 99%   BMI 28.94 kg/m  Body mass index is 28.94 kg/m. GEN: alert, well  developed HEENT: clear conjunctiva, nose with + mild inferior turbinate hypertrophy, pink nasal mucosa, + slight clear rhinorrhea, no cobblestoning HEART: regular rate and rhythm, no murmur LUNGS: clear to auscultation bilaterally, no coughing, unlabored respiration ABDOMEN: soft, non distended  SKIN: few raised firm lesions on arms and back with excoriations   Assessment:   1. Other allergic rhinitis   2. Rash and nonspecific skin eruption   3. Prurigo nodularis     Plan/Recommendations:  Rash:  - Will refer to Dermatology. Discussed possibly prurigo nodularis? - Do a daily soaking tub bath in warm water for 10-15 minutes.  - Use a gentle, unscented cleanser at the end of the bath (such as Dove unscented bar or baby wash, or Aveeno sensitive body wash). Then rinse, pat half-way dry, and apply a gentle, unscented moisturizer cream or ointment (Cerave, Cetaphil, Eucerin, Aveeno, Aquaphor, Vanicream, Vaseline)  all over while still damp. Dry skin makes the itching worse. The skin should be moisturized with a gentle, unscented moisturizer at least twice daily.  - Use only unscented liquid laundry detergent. - Apply prescribed topical steroid (triamcinolone 0.1% below neck) to flared areas (red and thickened eczema) after the moisturizer has soaked into the skin (wait at least 30 minutes). Taper off the topical steroids as the skin improves. Do not use topical steroid for more than 7-10 days at a time.  - Use Zyrtec 10 mg daily.   Other Allergic Rhinitis: - Due to turbinate hypertrophy, seasonal symptoms and unresponsive to over the counter meds, will perform skin testing to identify aeroallergen triggers.   - Use Flonase 2 sprays each nostril daily as needed. Aim upward and outward.   Hold all anti-histamines (Xyzal, Allegra, Zyrtec, Claritin, Benadryl, Pepcid) 3 days prior to next visit.   Follow up: 4/11 at 130 PM for skin testing 1-55   Alesia Morin, MD [facility]

## 2023-05-31 NOTE — Patient Instructions (Addendum)
 Rash:  - Take pictures and videos of your rashes.  - Do a daily soaking tub bath in warm water for 10-15 minutes.  - Use a gentle, unscented cleanser at the end of the bath (such as Dove unscented bar or baby wash, or Aveeno sensitive body wash). Then rinse, pat half-way dry, and apply a gentle, unscented moisturizer cream or ointment (Cerave, Cetaphil, Eucerin, Aveeno, Aquaphor, Vanicream, Vaseline)  all over while still damp. Dry skin makes the itching worse. The skin should be moisturized with a gentle, unscented moisturizer at least twice daily.  - Use only unscented liquid laundry detergent. - Apply prescribed topical steroid (triamcinolone 0.1% below neck) to flared areas (red and thickened eczema) after the moisturizer has soaked into the skin (wait at least 30 minutes). Taper off the topical steroids as the skin improves. Do not use topical steroid for more than 7-10 days at a time.  - Use Zyrtec 10 mg daily.  - Will refer to Dermatology. Discussed possibly prurigo nodularis.    Other Allergic Rhinitis: - Use Flonase 2 sprays each nostril daily as needed. Aim upward and outward.  Hold all anti-histamines (Xyzal, Allegra, Zyrtec, Claritin, Benadryl, Pepcid) 3 days prior to next visit.   Follow up: 4/11 at 130 PM for skin testing 1-55

## 2023-06-07 ENCOUNTER — Ambulatory Visit: Admitting: Internal Medicine

## 2023-06-07 DIAGNOSIS — J301 Allergic rhinitis due to pollen: Secondary | ICD-10-CM | POA: Diagnosis not present

## 2023-06-07 MED ORDER — CETIRIZINE HCL 10 MG PO TABS: 10.0000 mg | ORAL_TABLET | Freq: Every day | ORAL | 11 refills | Status: AC | PRN

## 2023-06-07 MED ORDER — FLUTICASONE PROPIONATE 50 MCG/ACT NA SUSP
1.0000 | Freq: Every day | NASAL | 11 refills | Status: AC | PRN
Start: 2023-06-07 — End: ?

## 2023-06-07 NOTE — Progress Notes (Signed)
 FOLLOW UP Date of Service/Encounter:  06/07/23   Subjective:  Mia Rubio (DOB: 1980/06/01) is a 43 y.o. female [facility] for follow up for skin testing.   History obtained from: chart review and patient.  Anti histamines held.   Past Medical History: Past Medical History:  Diagnosis Date   Common migraine with intractable migraine 12/19/2017   Hypotension    Low blood pressure    Panic disorder 12/19/2017   "something that happens when I have a flare" regarding POTS   POTS (postural orthostatic tachycardia syndrome)    SVT (supraventricular tachycardia) (HCC)    Tachycardia     Objective:  There were no vitals taken for this visit. There is no height or weight on file to calculate BMI. Physical Exam: GEN: alert, well developed HEENT: clear conjunctiva, MMM LUNGS: unlabored respiration  Skin Testing:  Skin prick testing was placed, which includes aeroallergens/foods, histamine control, and saline control.  Verbal consent was obtained prior to placing test.  Patient tolerated procedure well.  Allergy testing results were read and interpreted by myself, documented by clinical staff. Adequate positive and negative control.  Positive results to:  Results discussed with patient/family.  Airborne Adult Perc - 06/07/23 1341     Time Antigen Placed 1341    Allergen Manufacturer Waynette Buttery    Location Back    Number of Test 55    1. Control-Buffer 50% Glycerol Negative    2. Control-Histamine 3+    3. Bahia 3+    4. French Southern Territ[place] Negative    5. Johnson 2+    6. Kentucky Blue 3+    7. Meadow Fescue Negative    8. Perennial Rye 3+    9. Timothy Negative    10. Ragweed Mix Negative    11. Cocklebur Negative    12. Plantain,  English Negative    13. Baccharis Negative    14. Dog Fennel 3+    15. Russian Thistle Negative    16. Lamb's Quarters Negative    17. Sheep Sorrell Negative    18. Rough Pigweed Negative    19. Marsh  Elder, Rough Negative    20. Mugwort, Common Negative    21. Box, Elder Negative    22. Cedar, red Negative    23. Sweet Gum Negative    24. Pecan Pollen Negative    25. Pine Mix 2+    26. Walnut, Black Pollen Negative    27. Red Mulberry Negative    28. Ash Mix Negative    29. Birch Mix Negative    30. Beech American Negative    31. Cottonwood, Guinea[place] Negative    32. Hickory, White Negative    33. Maple Mix Negative    34. Oak, [place] Mix 3+    35. Sycamore Eastern Negative    36. Alternaria Alternata Negative    37. Cladosporium Herbarum Negative    38. Aspergillus Mix Negative    39. Penicillium Mix Negative    40. Bipolaris Sorokiniana (Helminthosporium) Negative    41. Drechslera Spicifera (Curvularia) Negative    42. Mucor Plumbeus Negative    43. Fusarium Moniliforme Negative    44. Aureobasidium Pullulans (pullulara) Negative    45. Rhizopus Oryzae Negative    46. Botrytis Cinera Negative    47. Epicoccum Nigrum Negative    48. Phoma Betae Negative    49. Dust Mite Mix Negative    50. Cat Hair 10,000 BAU/ml Negative  51.  Dog Epithelia Negative    52. Mixed Feathers Negative    53. Horse Epithelia Negative    54. Cockroach, German Negative    55. Tobacco Leaf Negative              Assessment:   1. Seasonal allergic rhinitis due to pollen     Plan/Recommendations:  Allergic Rhinitis: - Due to turbinate hypertrophy and unresponsive to over the counter meds, will perform skin testing to identify aeroallergen triggers.   - SPT 05/2023: positive to grasses, weeds, trees.   - Use Flonase 1-2 sprays each nostril daily as needed. Aim upward and outward. - Use Zyrtec 10mg  daily as needed for runny nose, sneezing, itchy watery eyes.    Rash:  - Take pictures and videos of your rashes.  - Do a daily soaking tub bath in warm water for 10-15 minutes.  - Use a gentle, unscented cleanser at the end of the bath (such as Dove unscented bar or baby wash, or Aveeno  sensitive body wash). Then rinse, pat half-way dry, and apply a gentle, unscented moisturizer cream or ointment (Cerave, Cetaphil, Eucerin, Aveeno, Aquaphor, Vanicream, Vaseline)  all over while still damp. Dry skin makes the itching worse. The skin should be moisturized with a gentle, unscented moisturizer at least twice daily.  - Use only unscented liquid laundry detergent. - Apply prescribed topical steroid (triamcinolone 0.1% below neck) to flared areas. Taper off the topical steroids as the skin improves. Do not use topical steroid for more than 7-10 days at a time.  - Referred to Dermatology. Discussed possibly prurigo nodularis but appears to be mostly healed??    Return if symptoms worsen or fail to improve.  Alesia Morin, MD [facility]

## 2023-06-07 NOTE — Patient Instructions (Addendum)
 Allergic Rhinitis: - SPT 05/2023: positive to grasses, weeds, trees.   - Use Flonase 1-2 sprays each nostril daily as needed. Aim upward and outward. - Use Zyrtec 10mg  daily as needed for runny nose, sneezing, itchy watery eyes.    Rash:  - Take pictures and videos of your rashes.  - Do a daily soaking tub bath in warm water for 10-15 minutes.  - Use a gentle, unscented cleanser at the end of the bath (such as Dove unscented bar or baby wash, or Aveeno sensitive body wash). Then rinse, pat half-way dry, and apply a gentle, unscented moisturizer cream or ointment (Cerave, Cetaphil, Eucerin, Aveeno, Aquaphor, Vanicream, Vaseline)  all over while still damp. Dry skin makes the itching worse. The skin should be moisturized with a gentle, unscented moisturizer at least twice daily.  - Use only unscented liquid laundry detergent. - Apply prescribed topical steroid (triamcinolone 0.1% below neck) to flared areas. Taper off the topical steroids as the skin improves. Do not use topical steroid for more than 7-10 days at a time.  - Referred to Dermatology. Discussed possibly prurigo nodularis.

## 2023-06-11 ENCOUNTER — Telehealth: Payer: Self-pay | Admitting: Internal Medicine

## 2023-06-11 NOTE — Telephone Encounter (Signed)
 Mia Rubio has been internally referred to [facility].  They will reach out to the patient to schedule.  If they have not reached out to her by 4/17, I will call the patient and see if she would like to try somewhere else.

## 2023-06-12 ENCOUNTER — Encounter: Payer: Self-pay | Admitting: Neurology

## 2023-06-12 ENCOUNTER — Ambulatory Visit (INDEPENDENT_AMBULATORY_CARE_PROVIDER_SITE_OTHER): Admitting: Neurology

## 2023-06-12 VITALS — BP 128/84 | HR 83 | Ht 69.0 in | Wt 196.0 lb

## 2023-06-12 DIAGNOSIS — F419 Anxiety disorder, unspecified: Secondary | ICD-10-CM

## 2023-06-12 DIAGNOSIS — R41840 Attention and concentration deficit: Secondary | ICD-10-CM | POA: Diagnosis not present

## 2023-06-12 DIAGNOSIS — R5383 Other fatigue: Secondary | ICD-10-CM

## 2023-06-12 DIAGNOSIS — R42 Dizziness and giddiness: Secondary | ICD-10-CM | POA: Diagnosis not present

## 2023-06-12 DIAGNOSIS — R202 Paresthesia of skin: Secondary | ICD-10-CM

## 2023-06-12 DIAGNOSIS — R4189 Other symptoms and signs involving cognitive functions and awareness: Secondary | ICD-10-CM

## 2023-06-12 DIAGNOSIS — G500 Trigeminal neuralgia: Secondary | ICD-10-CM

## 2023-06-12 DIAGNOSIS — R200 Anesthesia of skin: Secondary | ICD-10-CM

## 2023-06-12 DIAGNOSIS — H538 Other visual disturbances: Secondary | ICD-10-CM

## 2023-06-12 DIAGNOSIS — R2990 Unspecified symptoms and signs involving the nervous system: Secondary | ICD-10-CM

## 2023-06-12 DIAGNOSIS — G379 Demyelinating disease of central nervous system, unspecified: Secondary | ICD-10-CM

## 2023-06-12 DIAGNOSIS — R251 Tremor, unspecified: Secondary | ICD-10-CM

## 2023-06-12 DIAGNOSIS — R479 Unspecified speech disturbances: Secondary | ICD-10-CM

## 2023-06-12 DIAGNOSIS — R9082 White matter disease, unspecified: Secondary | ICD-10-CM

## 2023-06-12 DIAGNOSIS — R208 Other disturbances of skin sensation: Secondary | ICD-10-CM

## 2023-06-12 DIAGNOSIS — G479 Sleep disorder, unspecified: Secondary | ICD-10-CM

## 2023-06-12 DIAGNOSIS — R519 Headache, unspecified: Secondary | ICD-10-CM

## 2023-06-12 MED ORDER — GABAPENTIN 100 MG PO TABS: ORAL_TABLET | ORAL | 6 refills | Status: DC

## 2023-06-12 NOTE — Progress Notes (Signed)
 [facility]    Provider:  Dr Mia Rubio Requesting Provider:  [name] Primary Care Provider:  Albertina Rubio   CC:  Headaches, generalized light sensitivity, vestibular issues and visual disturbance  06/12/2023: Patient we know extremely well, multiple neurologic and somatic symptoms, has Intrauterine normal pregnancy; Missed abortion; POTS (postural orthostatic tachycardia syndrome); Panic disorder; Common migraine with intractable migraine; Chronic migraine without aura without status migrainosus, not intractable; Migraine with aura and without status migrainosus, not intractable; Light sensitivity; SVT (supraventricular tachycardia) (HCC); and Orthostasis on their problem list.  Patient with reported the following: Migraine with migraine aura, on Botox  and CGRP medication and has failed a plethora of other preventatives and acute management medications.  Multiple imaging and multiple evaluations and specialists.   Patient today reports: Brain fog difficulty thinking concentrating and remembering,  Lightheadedness and dizziness very common upon standing Headaches frequent Fatigue often significant and can worsen with activity Tremors or shakiness Blurred or tunnel vision Sleep disturbances Anxiety and nervousness Paresthesias tingling or numbness Has cognitive impairment similar to brain fog affecting various aspects of thinking Less commonly speech difficulties Electrical shocks like MS patients who have a lot in common with these If takes Xanax  symptoms go away looking for a non-Xanax  option for dementia reasons Adrenaline rushes that cause most severe nervous system malfunction Dr. Roderic Rubio mentioned a medication for adrenaline surges uncontrollable fight or flight Sleeping restore sleep helps everything Panic attacks Circumlocations: Talking around the intended were describing its function or characteristics instead of naming it directly Using general  terms: Substituting vague words like things to for it for specific and Once feeling of "tip of the tongue" knowing there were but being unable to access it Brain fog She always feels a fullness in her head and starts from her blood pressure.  Electrical shocks in the face Electrical shocks when she moves her eyes to the right or to the left, when she looks left and right she gets zaps in er tongue and lips she is going to remove a tooth on the left She has had a sleep study, she has chronic insomnia Tremors   MRI of the brain w/wo contrast and MRi cervical spine w/wo contrast MS protocol   Patient complains of symptoms per HPI as well as the following symptoms: POTS . Pertinent negatives and positives per HPI. All others negative   11/14/2022: discussed allodynia in migraines she is stable but has an aura often and allodynia discussed options including  changing emgality  to qulipta (gave samples she will qulipta  along with emgality  and see if she improves then possibly consider discontinuing Emgality ). Gave Samples: Start Qulipta  for 3 weeks and let me know how you are doing Can think about changing Emgality  to Vyepti she has > 50% improvement but still has 15 total headache days and 8 migraines a month without aura, no medication overuse or aura with these She does have concomitant migraines with aura without headaches. Discussed the role of Magnesium in migraine with aura. Discussed trying Lamictal  which is good with migraine aura but not headache but she suffers from migraine aura alone almost every day Can continue emgality  and try qulipta  but will try and get vyepti approved and think about trying magnesium and lamictal  for migraine   Sent to patient:  11/14/2022: discussed allodynia in migraines she is stable but has an aura often and allodynia discussed options including  changing emgality  to qulipta (gave samples you may take qulipta  daily along with emgality  and see if you  improve then  possibly consider discontinuing Emgality  can give me an update). Gave Samples: Start Qulipta  for 3 weeks and let me know how you are doing Can think about changing Emgality  to Vyepti, will try and get Vyepti approved and if so can discuss further how to incorporate this and what we may chose to stop Also Discussed the role of Magnesium in migraine with aura. Discussed trying Lamictal  which is good with migraine aura but not headache but she suffers from migraine aura alone almost every day 5. Increase venlafaxine  6. Continue nurtec once dailyas needed  Magnesium doses depending on which one you take(pick one) and other OTC medications you can try: 200 mg mag citrate 1-2x daily OR 400 mg mag oxide 1-2x daily OR Mag sulfate 400 to 600 mg daily(can   Other optional vitamin supplementation that may work includes 1000 international units AND vitamin D3 daily AND 500 to 1000 mg of B12 daily AND Riboflavin 400 mg a day AND 150 to 300 mg co-Q10 daily  Discussed trying gabapentin  can be used off label for POTS  12/19/2021: 2 weeks ago she went to cardiology and he said the ablation damaged her autonomic nerves including her phrenic and the vagus. May be from entry point near the carotid nerve neck catheter(?). She had a lot of bruising at the entry point. Since then she is having a lot of symptoms: she is having shortness of breath, anxiety, insomnia, morning headache, excessive daytime somnolence, orthopnea(has to use pillows to breath), she sleeps all day, extremely fatigued, naps all day, she is not comfortable driging and her cardiologist said she should not drive, she has dizziness, temporal facial pain (MRI of the brain and orbits 09/08/2021) but Carotidynia, also known as Fay syndrome or TIPIC syndrome, is a very rare vascular disorder presenting with unilateral neck and facial pain around the eyes. Pain in the right (points to the carotid artery). Difficulty swallowing (recommend talk to Mia Rubio or [facility])  Patient complains of symptoms per HPI as well as the following symptoms: neck pain, head pain, dizziness, SOB . Pertinent negatives and positives per HPI. All others negative   11/30/2021: Botox  appointment: Prior to botox  daily headaches and most of the month migraines maybe even daily. Botox  has changed her life. She has a lot of light sensitivity. But even the light sensitivity is better, she wears FL-41. After botox  has 6 evere migraine days a month and < 14 total headache days a month significant >>50% improvement on botox  in frequ and severity. Failed ubrelvy , imitrex, rizatriptan , zomig. Can try ubrelvy  if nurtec not approved. Affects her ability to drive, light sensitivity affects her ability to go outside, filled out a car tinting form, still has 6 -7 severe migraine days a month and headaches, has POTS with fatigue and exercise instability. Will include a personal letter for disability claim. She cannot work 8 hours straight without restm migraines exacerbated by fatigue, lights, exertion, significantly impacting her life despite improvement by botox . Higher incidence of POTS with migraines. In acute dsitress when I see her.   09/07/2021: Botox  appointment: is working well.  06/14/2021: Botox  appointment: Reports botox  working well. Two weeks late for botox  so she has had more migraines. 1/ week migraine when taking botox  consistently. Never severe.   08/15/2021; Patient is here for new chief complaint sharp pains behind eyes. PMHx migraines doing well on botox , POTS, anxiety. She is clenching her teeth a lot. This is completely different than the migraines.  She says she gets brain zaps, used to get them once a month now it is throughout the day multiple times a day, in her eyeballs in her eyeballs and tongue when she moves her eyes laterally or medially. Not coming from anywhere actually in the eyeball. She saw the eye doctor and everything was fine. She feels like  she has a film over her eyes, blurry vision, diplopia, pain on eye movement, brief, sharp, not radiating. 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 We also discussed emgality .   Patient complains of symptoms per HPI as well as the following symptoms: eye pain . Pertinent negatives and positives per HPI. All others negative   MRI brain 04/2020: reviewed images and agree: IMPRESSION: Slightly abnormal MRI scan of the brain with and without contrast showing tiny scattered bilateral supratentorial white matter hyperintensities with a differential discussed above.  07/2021 cbc/bmp unremarkable  HPI:  Mia Rubio is a 43 y.o. female here as requested by Ruven Coy, MD for headaches, visual disturbance, vestibular issues.  She has a past medical history of POTS, anxiety, seeing psychiatry to establish care.  I reviewed Dr. Nidia Barrows notes, patient was seen there for visual disturbance, patient stated her vision seemed fine with glasses but she noted issues with three-dimensional perception in which some objects in her vision seem to move further closer away, exacerbated with motion such as riding the lawnmower or driving in the car.  She has no true diplopia, she says she has not been driving in several years because the depth perception problem seems to be an issue, light sensitivity in general and lights can sometimes trigger migraine headaches or scintillating scotoma, the visual aura can occur a number of times a week, she seen a neurologist does not trial of migraine medications, she has POTS disease and is on atenolol  but does not think this is related and is connected to her POTS.  Dr. Emeterio Hansen Martin's examination showed visual acuity of 20/20 and 20/25, small anisocoria with the right pupil larger than the left about the same in darkness and light, no relative apparent pupillary defect, full visual fields, full extraocular movements, slit-lamp and fundus exam normal, diagnosis "a relatively normal neuro  ophthalmic examination ", minimal evidence of dry eye syndrome, may be a migraine related issue.  Patient saw Dr. Tilda Fogo in October 2019, female with a history of migraine headache, migraines headaches around 2010, MRI of the brain unremarkable, patient has POTS since 2010, diagnosed with POTS in 2017, on Florinef , longstanding history of motion sickness, rapid head movements and fluorescent lights may worsen her symptoms, lots of anxiety issues and now having panic attacks, denied any other focal neurologic symptoms.  She has light sensitivity, she has not driven since she has light sensitivity. She has tremors in hre hands. She has "lack of oxygen to the brain" because of a POTS flare, she has bad memory and can;t get words out, eyes tremor when she wakes up(dry eye?), when she is driving she may get hit by a ray of light and it caused her vision to start going away from her, causes anxiety and then a POTS flare, headlights at night trigger it, if she takes xanax  she can avoid this and keeps her calm so she can enjoy loud music for example, she has auras twice a day, Migraines do not always have an aura and she tends to have auras without headaches, but can happen with headache, feels like they start slow, the aura is annoying but  doesn't stop her from doing anything.  28 headache days a month, 15 migraine days a month without aura that are moderately severe, can be unilateral, pulsating/pounding/throbbing, movement makes it worse, photophobia, nausea, last up to 24 hours to 72 hours, ongoing for > 1 year at this severity and frequency, no medication overuse. Patient has blurry vision with the headaches, wakes with them and can be worse supine. No other focal neurologic deficits, associated symptoms, inciting events or modifiable factors.  Patient endorses trial periods more than 3 months in the past for these medications.  Medications tried that can be used in migraine management includes: Atenolol (tried  moe than 3 months),Amitriptyline(tried more than 3 months), venlafaxine (ried more than 3 months), sumatriptan, topamax (just started), amitriptyline, maxalt , gabapentin (more than 3 months trial)  Reviewed notes, labs and imaging from outside physicians, which showed: see above  05/21/2019: BMP normal  Review of Systems: Patient complains of symptoms per HPI as well as the following symptoms:light sensitivity. Pertinent negatives and positives per HPI. All others negative.   Social History   Socioeconomic History   Marital status: Married    Spouse name: Not on file   Number of children: Not on file   Years of education: Not on file   Highest education level: Not on file  Occupational History   Not on file  Tobacco Use   Smoking status: Former    Current packs/day: 0.00    Average packs/day: 1 pack/day for 14.0 years (14.0 ttl pk-yrs)    Types: Cigarettes    Start date: 2002    Quit date: 2016    Years since quitting: 9.3    Passive exposure: Past   Smokeless tobacco: Never   Tobacco comments:    Pt states she vapes and it has nicotine.   Vaping Use   Vaping status: Some Days   Substances: Nicotine  Substance and Sexual Activity   Alcohol use: Not Currently   Drug use: No   Sexual activity: Not on file  Other Topics Concern   Not on file  Social History Narrative   Lives at home with husband and son    Right handed   Caffeine: none    Social Drivers of Corporate investment banker Strain: Not on file  Food Insecurity: Not on file  Transportation Needs: Not on file  Physical Activity: Not on file  Stress: Not on file  Social Connections: Not on file  Intimate Partner Violence: Not on file    Family History  Problem Relation Age of Onset   Heart murmur Father    Cancer Maternal Grandmother        leg   Diabetes Maternal Grandmother    Headache Maternal Great-grandmother        "sick headache", probably migraine    Sleep apnea Neg Hx    Sleep disorder Neg Hx      Past Medical History:  Diagnosis Date   Allergies    environmental   Atypical face pain    Common migraine with intractable migraine 12/19/2017   Hypotension    Low blood pressure    Panic disorder 12/19/2017   "something that happens when I have a flare" regarding POTS   POTS (postural orthostatic tachycardia syndrome)    SVT (supraventricular tachycardia) (HCC)    Tachycardia     Patient Active Problem List   Diagnosis Date Noted   SVT (supraventricular tachycardia) (HCC)    Orthostasis    Chronic migraine without aura without status migrainosus,  not intractable 04/28/2020   Migraine with aura and without status migrainosus, not intractable 04/28/2020   Light sensitivity 04/28/2020   POTS (postural orthostatic tachycardia syndrome) 12/19/2017   Panic disorder 12/19/2017   Common migraine with intractable migraine 12/19/2017   Missed abortion 11/24/2016   Intrauterine normal pregnancy 08/19/2014    Past Surgical History:  Procedure Laterality Date   APPENDECTOMY     BREAST EXCISIONAL BIOPSY Right    age 89   BREAST SURGERY     CESAREAN SECTION N/A 08/19/2014   Procedure: CESAREAN SECTION;  Surgeon: Keene Pastures, DO;  Location: [facility];  Service: Obstetrics;  Laterality: N/A;   DILATION AND EVACUATION N/A 11/24/2016   Procedure: DILATATION AND EVACUATION;  Surgeon: Lula Sale, MD;  Location: [facility];  Service: Gynecology;  Laterality: N/A;   FOOT SURGERY  2011 2012   KNEE SURGERY  2001  2002   plantar fasciitis     SVT ABLATION N/A 08/22/2021   Procedure: SVT ABLATION;  Surgeon: Tammie Fall, MD;  Location: [facility];  Service: Cardiovascular;  Laterality: N/A;    Current Outpatient Medications  Medication Sig Dispense Refill   acetaminophen  (TYLENOL  8 HOUR) 650 MG CR tablet 2 tablets as needed     ALPRAZolam  (XANAX  XR) 1 MG 24 hr tablet Take 1 mg by mouth daily.     botulinum toxin Type A  (BOTOX ) 200 units injection INJECT 155 UNITS  INTRAMUSCULARLY TO HEAD AND NECK EVERY 12 WEEKS  (DISCARD UNUSED AFTER FIRST USE) 1 each 3   cetirizine  (ZYRTEC  ALLERGY ) 10 MG tablet Take 1 tablet (10 mg total) by mouth daily as needed for allergies. 30 tablet 11   dicyclomine (BENTYL) 20 MG tablet Take 20 mg by mouth daily as needed for spasms.     fluticasone  (FLONASE ) 50 MCG/ACT nasal spray Place 1 spray into both nostrils daily as needed for allergies or rhinitis. 16 g 11   gabapentin  (NEURONTIN ) 100 MG tablet Take 100mg  twice day and 100-300mg  at bedtime 150 tablet 6   Galcanezumab -gnlm (EMGALITY ) 120 MG/ML SOAJ Inject 120 mg into the skin every 30 (thirty) days. 1.12 mL 11   hydrocortisone 2.5 % cream Apply 1 Application topically 2 (two) times daily.     ibuprofen  (ADVIL ) 800 MG tablet TAKE 1 TABLET(800 MG) BY MOUTH THREE TIMES DAILY AS NEEDED 90 tablet 11   ipratropium (ATROVENT) 0.06 % nasal spray 2 sprays in each nostril     ivabradine  (CORLANOR) 5 MG TABS tablet Take 1.5 tablets (7.5 mg total) by mouth 2 (two) times daily with a meal. (Patient taking differently: Take 7.5 mg by mouth daily.)     ketorolac  (TORADOL ) 60 MG/2ML SOLN injection Inject 1-2ml (30-60mg ) intramuscularly at onset of migraine. May repeat in 6 hours. Max twice a day and 4 days per month. 10 mL 0   meclizine  (ANTIVERT ) 25 MG tablet TAKE 1 TABLET(25 MG) BY MOUTH THREE TIMES DAILY AS NEEDED FOR DIZZINESS 30 tablet 0   midodrine  (PROAMATINE ) 5 MG tablet Take 1.5 tablets (7.5 mg total) by mouth 3 (three) times daily with meals. 405 tablet 1   Multiple Vitamin (MULTIVITAMIN PO) Take 2 tablets by mouth at bedtime.     ondansetron  (ZOFRAN -ODT) 4 MG disintegrating tablet Take 1 tablet (4 mg total) by mouth every 8 (eight) hours as needed for nausea or vomiting (may take with benadryl  if you have itching). 20 tablet 0   Potassium Citrate,Elemental K, 99 MG CAPS  Rimegepant Sulfate (NURTEC) 75 MG TBDP Take 1 tablet (75 mg total) by mouth daily as needed (Migraines). 16  tablet 3   rizatriptan  (MAXALT -MLT) 10 MG disintegrating tablet Take 1 tablet (10 mg total) by mouth as needed for migraine. May repeat in 2 hours if needed. Do not exceed 2 tablets in 24 hours. Do not take more than 2-3 times per week. 9 tablet 5   SLYND 4 MG TABS Take 4 mg by mouth daily.     SPIRIVA  RESPIMAT 1.25 MCG/ACT AERS Inhale 2 puffs into the lungs as needed.     SYMBICORT  160-4.5 MCG/ACT inhaler Inhale 2 puffs into the lungs 2 (two) times daily. As needed     SYRINGE-NEEDLE, DISP, 3 ML (B-D 3CC LUER-LOK SYR 25GX1") 25G X 1" 3 ML MISC Use with ketorolac  5 each 0   triamcinolone  ointment (KENALOG ) 0.1 % Apply twice daily for flare ups below neck, maximum 10 days. 453.6 g 1   UNABLE TO FIND 1 each at bedtime. Med Name: magnesium at night for migraine/sleep     UNABLE TO FIND Med Name: four 5mg  CBD gummies for sleep     venlafaxine  XR (EFFEXOR -XR) 150 MG 24 hr capsule Take 1 capsule (150 mg total) by mouth daily with breakfast. Take with 75 mg for total of 225 mg daily. 90 capsule 11   venlafaxine  XR (EFFEXOR -XR) 75 MG 24 hr capsule Take 1 capsule (75 mg total) by mouth daily with breakfast. Take with 150 mg for total of 225 mg daily. 90 capsule 11   zolpidem  (AMBIEN ) 10 MG tablet Take 10 mg by mouth at bedtime.     No current facility-administered medications for this visit.    Allergies as of 06/12/2023 - Review Complete 06/12/2023  Allergen Reaction Noted   Caffeine Palpitations 07/14/2015   Ciprofloxacin Other (See Comments) 11/30/2019   Codeine Itching 11/28/2011   Meperidine  hcl Other (See Comments) 11/30/2019   Ondansetron  Other (See Comments) 11/30/2019   Phenergan  [promethazine ] Other (See Comments) 11/30/2019   Prednisone  Other (See Comments) 11/30/2019   Septra [sulfamethoxazole-trimethoprim] Other (See Comments) 11/30/2019   Phenergan  [promethazine  hcl] Itching and Rash 05/02/2013    Vitals: BP 128/84 (BP Location: Left Arm, Patient Position: Sitting, Cuff Size:  Normal)   Pulse 83   Ht 5\' 9"  (1.753 m)   Wt 196 lb (88.9 kg)   BMI 28.94 kg/m  Last Weight:  Wt Readings from Last 1 Encounters:  06/12/23 196 lb (88.9 kg)   Last Height:   Ht Readings from Last 1 Encounters:  06/12/23 5\' 9"  (1.753 m)   Physical exam: Exam: Gen: NAD, conversant      CV: No palpitations or chest pain or SOB. VS: Breathing at a normal rate. Weight appears within normal limits. Not febrile. Eyes: Conjunctivae clear without exudates or hemorrhage  Neuro: Detailed Neurologic Exam  Speech:    Speech is normal; fluent and spontaneous with normal comprehension.  Cognition:    The patient is oriented to person, place, and time;     recent and remote memory intact;     language fluent;     normal attention, concentration, fund of knowledge Cranial Nerves:    The pupils are equal, round, and reactive to light. Visual fields are full Extraocular movements are intact.  The face is symmetric with normal sensation. The palate elevates in the midline. Hearing intact. Voice is normal. Shoulder shrug is normal. The tongue has normal motion without fasciculations.   Coordination: normal  Gait:  No abnormalities noted or reported  Motor Observation:   no involuntary movements noted. Tone:    Appears normal  Posture:    Posture is normal. normal erect    Strength:    Strength is anti-gravity and symmetric in the upper and lower limbs.      Sensation: intact to LT, no reports of numbness or tingling or paresthesias          Assessment/Plan:  43 year old with chronic migraine, failed multiple classes of medications. Doing well on botox  now with only 4 migraine days a month and < 10 total headache days a month. Here for new symptoms of electrical shocks in her eyes, blurry vision/diplopia, pain on eye movement, could be atypical trigeminal neuralgia or possibly something like orbital pseudotumor very unusual need MRi brain and orbits.Also having occipital  neuralgia willperform nerve blocks.  Patient with reported the following: Migraine with migraine aura, on Botox  and CGRP medication and has failed a plethora of other preventatives and acute management medications.  Multiple imaging and multiple evaluations and specialists.   Patient today reports: Brain fog difficulty thinking concentrating and remembering,  Lightheadedness and dizziness very common upon standing Headaches frequent Fatigue often significant and can worsen with activity Tremors or shakiness Blurred or tunnel vision Sleep disturbances Anxiety and nervousness Paresthesias tingling or numbness Has cognitive impairment similar to brain fog affecting various aspects of thinking Less commonly speech difficulties Electrical shocks like MS patients who have a lot in common with these If takes Xanax  symptoms go away looking for a non-Xanax  option for dementia reasons Adrenaline rushes that cause most severe nervous system malfunction Dr. Roderic Rubio mentioned a medication for adrenaline surges uncontrollable fight or flight Sleeping restore sleep helps everything Panic attacks Circumlocations: Talking around the intended were describing its function or characteristics instead of naming it directly Using general terms: Substituting vague words like things to for it for specific and Once feeling of "tip of the tongue" knowing there were but being unable to access it Brain fog She always feels a fullness in her head and starts from her blood pressure.  Electrical shocks in the face Electrical shocks when she moves her eyes to the right or to the left, when she looks left and right she gets zaps in er tongue and lips she is going to remove a tooth on the left She has had a sleep study, she has chronic insomnia Tremors   MRI of the brain w/wo contrast and MRi cervical spine w/wo contrast MS protocol for demyelinating disease due to multiple neurologic symptoms above and had white  matter changes on MRi brain in the past  Start Gabapentin   Meds ordered this encounter  Medications   gabapentin  (NEURONTIN ) 100 MG tablet    Sig: Take 100mg  twice day and 100-300mg  at bedtime    Dispense:  150 tablet    Refill:  6   Orders Placed This Encounter  Procedures   MR BRAIN W WO CONTRAST   MR CERVICAL SPINE W WO CONTRAST     [place] Plan/assessment:  - MRI of the brain w/wo contrast amd MRi orbits: nothing acute unremarkable - Had surgery at carotid artery (points to the carotid triangle) with recurrent facil pain check for carotid damage with CTA H&N that can cause facial pain - shortness of breath, 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 Albertina Hugger for morning headaches and hypoxemia overnight but initially will order ONO overnight to measure oxygenation.  - Difficulty swallowing (discuss with Alisa App if pulmonology is an appropriate referral not my area of expertise) -[facility]: Pots, gastric stasis, shortness, was told possible vagus nerve injury, send to [facility] autonomic clinic - Migraine: already on multiple meds botox , emgality , atenolol , nurtec, phenergan ,  effexor  and tried multiple others will wait and see what the work up above shows  Instructions to patient: Send an oxygen monitor overnight and can order a sleep study if needed CXR - walk in to [facility]onomia clinic CT Angiography of the carotid artery in the neck Swallowing and SOB - discuss these symptoms and others also with pcp  Not sure I can help with phrenic or carotid nerve damage as reported by patient may need to send to dysautonomia clinic at  [facility], she has pots and multiple symptoms of dysautonomia, also speak to her primary care  Light sensitivity: - She borrowed my somilight glasses and allay lamp, she tried them out for her light sensitivity - will send toradol  injections to [facility] to use sparingly - will try nurtec acutely (failed sumatriptan, rizatriptan , zomig) - discussed: FL-41 tinting (somnilight or theraspecs), Car tinting (et online for additonal tinting), Allay light, Emgality  in the future if needed*    Orders Placed This Encounter  Procedures   MR BRAIN W WO CONTRAST   MR CERVICAL SPINE W WO CONTRAST    Cc: Mia Demark MD  Mia [facility]  Aldona Amel, MD  [facility] [address] [place], [place] [address]  Phone [phone] Fax [phone]  I spent over 115 minutes on 1. Multiple neurological symptoms   2. Brain fog   3. Difficulty concentrating   4. Dizziness   5. Chronic intractable headache, unspecified headache type   6. Other fatigue   7. Tremors of nervous system   8. Blurry vision   9. Sleep disturbance   10. Anxiety   11. Paresthesias   12. Numbness   13. Difficulty with speech   14. Evaluate for Demyelinating disease (HCC)   15. White matter abnormality on MRI of brain   16. Electrical shock sensation   17. Trigeminal neuralgia pain      diagnosis.  This included previsit chart review, lab review, study review, order entry, electronic health record documentation, patient education on the different diagnostic and therapeutic options, counseling and coordination of care, risks and benefits of management, compliance, or risk factor reduction. This does not include time spent on nerve blocks

## 2023-06-12 NOTE — Patient Instructions (Signed)
 Gabapentin  Gabapentin Capsules or Tablets What is this medication? GABAPENTIN (GA ba pen tin) treats nerve pain. It may also be used to prevent and control seizures in people with epilepsy. It works by calming overactive nerves in your body. This medicine may be used for other purposes; ask your health care provider or pharmacist if you have questions. COMMON BRAND NAME(S): Active-PAC with Gabapentin, Ascencion Dike, Gralise, Neurontin What should I tell my care team before I take this medication? They need to know if you have any of these conditions: Kidney disease Lung or breathing disease Substance use disorder Suicidal thoughts, plans, or attempt by you or a family member An unusual or allergic reaction to gabapentin, other medications, foods, dyes, or preservatives Pregnant or trying to get pregnant Breastfeeding How should I use this medication? Take this medication by mouth with a glass of water. Follow the directions on the prescription label. You can take it with or without food. If it upsets your stomach, take it with food. Take your medication at regular intervals. Do not take it more often than directed. Do not stop taking except on your care team's advice. If you are directed to break the 600 or 800 mg tablets in half as part of your dose, the extra half tablet should be used for the next dose. If you have not used the extra half tablet within 28 days, it should be thrown away. A special MedGuide will be given to you by the pharmacist with each prescription and refill. Be sure to read this information carefully each time. Talk to your care team about the use of this medication in children. While this medication may be prescribed for children as young as 3 years for selected conditions, precautions do apply. Overdosage: If you think you have taken too much of this medicine contact a poison control center or emergency room at once. NOTE: This medicine is only for you. Do not share this  medicine with others. What if I miss a dose? If you miss a dose, take it as soon as you can. If it is almost time for your next dose, take only that dose. Do not take double or extra doses. What may interact with this medication? Alcohol Antihistamines for allergy, cough, and cold Certain medications for anxiety or sleep Certain medications for depression like amitriptyline, fluoxetine, sertraline Certain medications for seizures like phenobarbital, primidone Certain medications for stomach problems General anesthetics like halothane, isoflurane, methoxyflurane, propofol Local anesthetics like lidocaine, pramoxine, tetracaine Medications that relax muscles for surgery Opioid medications for pain Phenothiazines like chlorpromazine, mesoridazine, prochlorperazine, thioridazine This list may not describe all possible interactions. Give your health care provider a list of all the medicines, herbs, non-prescription drugs, or dietary supplements you use. Also tell them if you smoke, drink alcohol, or use illegal drugs. Some items may interact with your medicine. What should I watch for while using this medication? Visit your care team for regular checks on your progress. You may want to keep a record at home of how you feel your condition is responding to treatment. You may want to share this information with your care team at each visit. You should contact your care team if your seizures get worse or if you have any new types of seizures. Do not stop taking this medication or any of your seizure medications unless instructed by your care team. Stopping your medication suddenly can increase your seizures or their severity. This medication may cause serious skin reactions. They can happen  weeks to months after starting the medication. Contact your care team right away if you notice fevers or flu-like symptoms with a rash. The rash may be red or purple and then turn into blisters or peeling of the skin.  Or, you might notice a red rash with swelling of the face, lips or lymph nodes in your neck or under your arms. Wear a medical identification bracelet or chain if you are taking this medication for seizures. Carry a card that lists all your medications. This medication may affect your coordination, reaction time, or judgment. Do not drive or operate machinery until you know how this medication affects you. Sit up or stand slowly to reduce the risk of dizzy or fainting spells. Drinking alcohol with this medication can increase the risk of these side effects. Your mouth may get dry. Chewing sugarless gum or sucking hard candy, and drinking plenty of water may help. Watch for new or worsening thoughts of suicide or depression. This includes sudden changes in mood, behaviors, or thoughts. These changes can happen at any time but are more common in the beginning of treatment or after a change in dose. Call your care team right away if you experience these thoughts or worsening depression. If you become pregnant while using this medication, you may enroll in the [place] American Antiepileptic Drug Pregnancy Registry by calling [phone]. This registry collects information about the safety of antiepileptic medication use during pregnancy. What side effects may I notice from receiving this medication? Side effects that you should report to your care team as soon as possible: Allergic reactions or angioedema--skin rash, itching, hives, swelling of the face, eyes, lips, tongue, arms, or legs, trouble swallowing or breathing Rash, fever, and swollen lymph nodes Thoughts of suicide or self harm, worsening mood, feelings of depression Trouble breathing Unusual changes in mood or behavior in children after use such as difficulty concentrating, hostility, or restlessness Side effects that usually do not require medical attention (report to your care team if they continue or are  bothersome): Dizziness Drowsiness Nausea Swelling of ankles, feet, or hands Vomiting This list may not describe all possible side effects. Call your doctor for medical advice about side effects. You may report side effects to FDA at 1-800-FDA-1088. Where should I keep my medication? Keep out of reach of children and pets. Store at room temperature between 15 and 30 degrees C (59 and 86 degrees F). Get rid of any unused medication after the expiration date. This medication may cause accidental overdose and death if taken by other adults, children, or pets. To get rid of medications that are no longer needed or have expired: Take the medication to a medication take-back program. Check with your pharmacy or law enforcement to find a location. If you cannot return the medication, check the label or package insert to see if the medication should be thrown out in the garbage or flushed down the toilet. If you are not sure, ask your care team. If it is safe to put it in the trash, empty the medication out of the container. Mix the medication with cat litter, dirt, coffee grounds, or other unwanted substance. Seal the mixture in a bag or container. Put it in the trash. NOTE: This sheet is a summary. It may not cover all possible information. If you have questions about this medicine, talk to your doctor, pharmacist, or health care provider.  2024 Elsevier/Gold Standard (2021-11-28 00:00:00)

## 2023-06-17 ENCOUNTER — Encounter: Payer: Self-pay | Admitting: Internal Medicine

## 2023-06-17 ENCOUNTER — Ambulatory Visit: Attending: Internal Medicine | Admitting: Internal Medicine

## 2023-06-17 ENCOUNTER — Telehealth: Payer: Self-pay

## 2023-06-17 ENCOUNTER — Encounter: Payer: Self-pay | Admitting: Neurology

## 2023-06-17 VITALS — Ht 69.0 in

## 2023-06-17 DIAGNOSIS — I471 Supraventricular tachycardia, unspecified: Secondary | ICD-10-CM

## 2023-06-17 DIAGNOSIS — G90A Postural orthostatic tachycardia syndrome (POTS): Secondary | ICD-10-CM

## 2023-06-17 NOTE — Telephone Encounter (Signed)
 ..  Pt understands that although there may be some limitations with this type of visit, we will take all precautions to reduce any security or privacy concerns.  Pt understands that this will be treated like an in office visit and we will file with pt's insurance, and there may be a patient responsible charge related to this service. ? ?

## 2023-06-17 NOTE — Progress Notes (Signed)
 Electrophysiology TeleHealth Note      Date:  06/17/2023   ID:  Mia Rubio, DOB 08-14-1980, MRN [id number]  Location: patient's home  Provider location: [address] [place]  Evaluation Performed: Follow-up visit  PCP:  Ruven Coy, MD  Cardiologist:    Electrophysiologist:  SK   Chief Complaint:  POTS  History of Present Illness:   My location is [facility]. The Patients location is their home . Mia Rubio is a 43 y.o. female who presents via audio conferencing for a telehealth visit today.  Since last being seen in our clinic for syncope, dysautonomia, elevated blood pressures and fatigue at the last visit we discussed gradually increasing her medications increasing her ProAmatine  from 5--7.5, continuing her on the ivabradine  7.5 twice daily with discussions of using clonidine as a central sympatholytic,  the patient reports mentally better.  Spoke with neurologist re anxiety>> xanax  makes me feel normal,and neurologist, suggested GABAPENTIN >> HR is now much improved even after 3 days.   Now off Keppra and off wellbutrin; hysterectomy scheduled for may, and then will stop SLYND Now sleeping better.    Bending over still assoc withLH     The patient denies symptoms of fevers, chills, cough, or new SOB worrisome for COVID 19.    Past Medical History:  Diagnosis Date   Allergies    environmental   Atypical face pain    Common migraine with intractable migraine 12/19/2017   Hypotension    Low blood pressure    Panic disorder 12/19/2017   "something that happens when I have a flare" regarding POTS   POTS (postural orthostatic tachycardia syndrome)    SVT (supraventricular tachycardia) (HCC)    Tachycardia     Past Surgical History:  Procedure Laterality Date   APPENDECTOMY     BREAST EXCISIONAL BIOPSY Right    age 3   BREAST SURGERY     CESAREAN SECTION N/A 08/19/2014   Procedure: CESAREAN SECTION;  Surgeon: Keene Pastures, DO;  Location: [facility];  Service: Obstetrics;  Laterality: N/A;   DILATION AND EVACUATION N/A 11/24/2016   Procedure: DILATATION AND EVACUATION;  Surgeon: Lula Sale, MD;  Location: [facility];  Service: Gynecology;  Laterality: N/A;   FOOT SURGERY  2011 2012   KNEE SURGERY  2001  2002   plantar fasciitis     SVT ABLATION N/A 08/22/2021   Procedure: SVT ABLATION;  Surgeon: Tammie Fall, MD;  Location: [facility];  Service: Cardiovascular;  Laterality: N/A;    Current Outpatient Medications  Medication Sig Dispense Refill   acetaminophen  (TYLENOL  8 HOUR) 650 MG CR tablet 2 tablets as needed     ALPRAZolam  (XANAX  XR) 1 MG 24 hr tablet Take 1 mg by mouth daily.     botulinum toxin Type A  (BOTOX ) 200 units injection INJECT 155 UNITS INTRAMUSCULARLY TO HEAD AND NECK EVERY 12 WEEKS  (DISCARD UNUSED AFTER FIRST USE) 1 each 3   cetirizine  (ZYRTEC  ALLERGY ) 10 MG tablet Take 1 tablet (10 mg total) by mouth daily as needed for allergies. 30 tablet 11   dicyclomine (BENTYL) 20 MG tablet Take 20 mg by mouth daily as needed for spasms.     fluticasone  (FLONASE ) 50 MCG/ACT nasal spray Place 1 spray into both nostrils daily as needed for allergies or rhinitis. 16 g 11   gabapentin  (NEURONTIN ) 100 MG tablet Take 100mg  twice day and 100-300mg  at bedtime 150 tablet 6  Galcanezumab -gnlm (EMGALITY ) 120 MG/ML SOAJ Inject 120 mg into the skin every 30 (thirty) days. 1.12 mL 11   hydrocortisone 2.5 % cream Apply 1 Application topically 2 (two) times daily.     ibuprofen  (ADVIL ) 800 MG tablet TAKE 1 TABLET(800 MG) BY MOUTH THREE TIMES DAILY AS NEEDED 90 tablet 11   ipratropium (ATROVENT) 0.06 % nasal spray 2 sprays in each nostril     ivabradine  (CORLANOR) 5 MG TABS tablet Take 1.5 tablets (7.5 mg total) by mouth 2 (two) times daily with a meal. (Patient taking differently: Take 7.5 mg by mouth daily.)     ketorolac  (TORADOL ) 60 MG/2ML SOLN injection Inject 1-2ml (30-60mg ) intramuscularly at onset  of migraine. May repeat in 6 hours. Max twice a day and 4 days per month. 10 mL 0   meclizine  (ANTIVERT ) 25 MG tablet TAKE 1 TABLET(25 MG) BY MOUTH THREE TIMES DAILY AS NEEDED FOR DIZZINESS 30 tablet 0   midodrine  (PROAMATINE ) 5 MG tablet Take 1.5 tablets (7.5 mg total) by mouth 3 (three) times daily with meals. 405 tablet 1   Multiple Vitamin (MULTIVITAMIN PO) Take 2 tablets by mouth at bedtime.     ondansetron  (ZOFRAN -ODT) 4 MG disintegrating tablet Take 1 tablet (4 mg total) by mouth every 8 (eight) hours as needed for nausea or vomiting (may take with benadryl  if you have itching). 20 tablet 0   Potassium Citrate,Elemental K, 99 MG CAPS      Rimegepant Sulfate (NURTEC) 75 MG TBDP Take 1 tablet (75 mg total) by mouth daily as needed (Migraines). 16 tablet 3   rizatriptan  (MAXALT -MLT) 10 MG disintegrating tablet Take 1 tablet (10 mg total) by mouth as needed for migraine. May repeat in 2 hours if needed. Do not exceed 2 tablets in 24 hours. Do not take more than 2-3 times per week. 9 tablet 5   SLYND 4 MG TABS Take 4 mg by mouth daily.     SPIRIVA  RESPIMAT 1.25 MCG/ACT AERS Inhale 2 puffs into the lungs as needed.     SYMBICORT  160-4.5 MCG/ACT inhaler Inhale 2 puffs into the lungs 2 (two) times daily. As needed     SYRINGE-NEEDLE, DISP, 3 ML (B-D 3CC LUER-LOK SYR 25GX1") 25G X 1" 3 ML MISC Use with ketorolac  5 each 0   triamcinolone  ointment (KENALOG ) 0.1 % Apply twice daily for flare ups below neck, maximum 10 days. 453.6 g 1   UNABLE TO FIND 1 each at bedtime. Med Name: magnesium at night for migraine/sleep     UNABLE TO FIND Med Name: four 5mg  CBD gummies for sleep     venlafaxine  XR (EFFEXOR -XR) 150 MG 24 hr capsule Take 1 capsule (150 mg total) by mouth daily with breakfast. Take with 75 mg for total of 225 mg daily. 90 capsule 11   venlafaxine  XR (EFFEXOR -XR) 75 MG 24 hr capsule Take 1 capsule (75 mg total) by mouth daily with breakfast. Take with 150 mg for total of 225 mg daily. 90 capsule  11   zolpidem  (AMBIEN ) 10 MG tablet Take 10 mg by mouth at bedtime.     No current facility-administered medications for this visit.    Allergies:   Caffeine, Ciprofloxacin, Codeine, Meperidine  hcl, Ondansetron , Phenergan  [promethazine ], Prednisone , Septra [sulfamethoxazole-trimethoprim], and Phenergan  [promethazine  hcl]   ROS:  Please see the history of present illness.   All other systems are personally reviewed and negative.    Exam:    Vital Signs:  Ht 5\' 9"  (1.753 m)   BMI  28.94 kg/m    Labs/Other Tests and Data Reviewed:    Recent Labs: 02/25/2023: ALT 21; BUN 17; Creatinine, Ser 0.94; Hemoglobin 15.6; Platelets 217; Potassium 3.7; Sodium 135   Wt Readings from Last 3 Encounters:  06/12/23 196 lb (88.9 kg)  05/31/23 196 lb (88.9 kg)  05/15/23 193 lb 12.8 oz (87.9 kg)     Other studies personally reviewed: Additional studies/ records that were reviewed today include     ASSESSMENT & PLAN:     Syncope   Swelling dyspnea on exertion   Orthostatic and heat intolerance   Postural Tachycardia   Nonpositional tachycardia   Elevated blood pressure   Miscarriages--multiple   Anxiety   SVT status post ablation with subsequent dysautonomic decompensation    Continue gabapentin  which has helped with central sympatholysis, now sleeping without night terrors, also coming off psychotropics and SLYND    Follow-up:  43m    Current medicines are reviewed at length with the patient today.   The patient  concerns regarding her medicines.  The following changes were made today:  none   Labs/ tests ordered today include:  No orders of the defined types were placed in this encounter.     Today, I have spent 11 minutes with the patient with telehealth technology discussing the above.  Signed, Mia Chandler, MD  06/17/2023 5:28 PM     [facility] [address] [place] [address] [phone] (office) [phone] (fax)

## 2023-06-17 NOTE — Patient Instructions (Signed)
 Medication Instructions:  Your physician recommends that you continue on your current medications as directed. Please refer to the Current Medication list given to you today.  *If you need a refill on your cardiac medications before your next appointment, please call your pharmacy*  Lab Work: None ordered.  If you have labs (blood work) drawn today and your tests are completely normal, you will receive your results only by: MyChart Message (if you have MyChart) OR A paper copy in the mail If you have any lab test that is abnormal or we need to change your treatment, we will call you to review the results.  Testing/Procedures: None ordered.   Follow-Up: At [facility], you and your health needs are our priority.  As part of our continuing mission to provide you with exceptional heart care, our providers are all part of one team.  This team includes your primary Cardiologist (physician) and Advanced Practice Providers or APPs (Physician Assistants and Nurse Practitioners) who all work together to provide you with the care you need, when you need it.  Your next appointment:   4 months - I will have Dr Doyle Generous scheduler contact you.      1st Floor: - Lobby - Registration  - Pharmacy  - Lab - Cafe  2nd Floor: - PV Lab - Diagnostic Testing (echo, CT, nuclear med)  3rd Floor: - Vacant  4th Floor: - TCTS (cardiothoracic surgery) - AFib Clinic - Structural Heart Clinic - Vascular Surgery  - Vascular Ultrasound  5th Floor: - HeartCare Cardiology (general and EP) - Clinical Pharmacy for coumadin, hypertension, lipid, weight-loss medications, and med management appointments    Valet parking services will be available as well.

## 2023-06-24 ENCOUNTER — Telehealth: Payer: Self-pay | Admitting: Neurology

## 2023-06-24 NOTE — Telephone Encounter (Signed)
 MRI brain [facility] auth: [id number] exp. 06/24/23-08/08/23  MRI cervical [employer] medicaid [employer]: [id number] exp. 06/24/23-08/08/23 sent to GI [phone]

## 2023-06-26 ENCOUNTER — Telehealth: Payer: Self-pay | Admitting: Pharmacy Technician

## 2023-06-26 ENCOUNTER — Other Ambulatory Visit (HOSPITAL_COMMUNITY): Payer: Self-pay

## 2023-06-26 ENCOUNTER — Encounter: Payer: Self-pay | Admitting: Pharmacy Technician

## 2023-06-26 ENCOUNTER — Other Ambulatory Visit: Payer: Self-pay | Admitting: Obstetrics and Gynecology

## 2023-06-26 DIAGNOSIS — M79622 Pain in left upper arm: Secondary | ICD-10-CM

## 2023-06-26 NOTE — Telephone Encounter (Signed)
 Pharmacy Patient Advocate Encounter   Received notification from CoverMyMeds that prior authorization for Midodrine  5mg   is required/requested.   Insurance verification completed.   The patient is insured through [facility] MEDICAID .   Per test claim: PA required; PA submitted to above mentioned insurance via CoverMyMeds Key/confirmation #/EOC [facility] Status is pending

## 2023-06-26 NOTE — Telephone Encounter (Signed)
 Pharmacy Patient Advocate Encounter  Received notification from [facility] MEDICAID that Prior Authorization for midodrine  has been APPROVED from 06/26/23 to 06/25/24. Spoke to pharmacy to process.Copay is $4.00.    PA #/Case ID/Reference #: [id number]

## 2023-06-26 NOTE — H&P (Signed)
 Mia Rubio is an 43 y.o. G2P1001 who is admitted for Robotic Assisted Total Laparoscopic Hysterectomy and Bilateral Salpingectomy for AUB and POTS.  Patient's original surgery was rescheduled to 07/10/2023.  Pt has hx of migraines with aura and POTS flares with menses and would like surgical management of bleeding due to being considered at high risk of stroke. Pt states she strongly desires hysterectomy in efforts to never have a period again. States when she has any bleeding at all, the POTS and dysautonomia make it incredibly difficult to function. Reports elevated risk for stroke given her medical history and she has been using Slynd for the last two years in efforts to avoid estrogen containing COCs. She does not desire future fertility. States Slynd works to maintain amenorrhea - she may occassionaly have some spotting. Feels Slynd make be contributing to some weight gain and low libido. She would prefer not to have to worry about taking any oral contraceptive daily to maintain amenorrheic status. Her mother had a hysterectomy and so has her sister.   Of note, she has history of an appendectomy and a c-section. States that she had scarring in her abdomen requiring her general surgeon to perform operative laparoscopy to remove adhesions. States she was having pain as a result of the adhesions which is why she was taken back for surgery. Has had multiple knee and foot surgeries without any issues with anesthesia.  In review of her medical history, she reports weight gain/difficulty losing weight despite eating lean meats, spinach, salads, etc. She reports exercise intolerance w/ dysautonomia/POTS, particularly since nerve damage from ablation for SVT in June 2023. She sees an Ship broker and is taking various supplements. Has not addressed weight with her cardiologist. She is currently using Slynd for contraception and menstrual suppression given h/o POTS flares w/ menses. She reports amenorrhea  with consistent Slynd use. To note, h/o migraine with aura - CHC contraindicated. She is considered to be at high risk for stroke and is interested in surgical consult to discuss surgical management of bleeding. She is using Wellbutrin SR 150 mg once daily for low libido likely 2/2 to venlafaxine  and atenolol  use. She reports libido has improved, and Wellbutrin has made her nicer.  Patient's Cardiologist (Dr. Richardo Chandler at [facility]) was contacted and he states, "I can't imagine that trendelenburg would be the problem. As you know, she will be volume sensitive. It has recently recently come to my attention that this particular birth control being a mineralocorticoid antagonist analog can be particularly provocative of symptoms in patients with POTS. An intermediate step might be to consider a different hormonal agent."  Patient's Neurologist (Dr. Aldona Amel at [facility]), she did not have any concerns regarding patient's current use of Slynd.  Work-up: Pap smear (03/05/2023): NILM/HRHPV negative  CT Renal Stone Study (02/25/23): Reproductive: Uterus and bilateral adnexa are unremarkable.    Patient Active Problem List   Diagnosis Date Noted   SVT (supraventricular tachycardia) (HCC)    Orthostasis    Chronic migraine without aura without status migrainosus, not intractable 04/28/2020   Migraine with aura and without status migrainosus, not intractable 04/28/2020   Light sensitivity 04/28/2020   POTS (postural orthostatic tachycardia syndrome) 12/19/2017   Panic disorder 12/19/2017   Common migraine with intractable migraine 12/19/2017   Missed abortion 11/24/2016   Intrauterine normal pregnancy 08/19/2014    MEDICAL/FAMILY/SOCIAL HX: No LMP recorded. (Menstrual status: Oral contraceptives).    Past Medical History:  Diagnosis Date   Allergies  environmental   Atypical face pain    Common migraine with intractable migraine 12/19/2017   Hypotension    Low blood  pressure    Panic disorder 12/19/2017   "something that happens when I have a flare" regarding POTS   POTS (postural orthostatic tachycardia syndrome)    SVT (supraventricular tachycardia) (HCC)    Tachycardia     Past Surgical History:  Procedure Laterality Date   APPENDECTOMY     BREAST EXCISIONAL BIOPSY Right    age 53   BREAST SURGERY     CESAREAN SECTION N/A 08/19/2014   Procedure: CESAREAN SECTION;  Surgeon: Keene Pastures, DO;  Location: [facility];  Service: Obstetrics;  Laterality: N/A;   DILATION AND EVACUATION N/A 11/24/2016   Procedure: DILATATION AND EVACUATION;  Surgeon: Lula Sale, MD;  Location: [facility];  Service: Gynecology;  Laterality: N/A;   FOOT SURGERY  2011 2012   KNEE SURGERY  2001  2002   plantar fasciitis     SVT ABLATION N/A 08/22/2021   Procedure: SVT ABLATION;  Surgeon: Tammie Fall, MD;  Location: [facility] INVASIVE CV LAB;  Service: Cardiovascular;  Laterality: N/A;    Family History  Problem Relation Age of Onset   Heart murmur Father    Cancer Maternal Grandmother        leg   Diabetes Maternal Grandmother    Headache Maternal Great-grandmother        "sick headache", probably migraine    Sleep apnea Neg Hx    Sleep disorder Neg Hx     Social History:  reports that she quit smoking about 9 years ago. Her smoking use included cigarettes. She started smoking about 23 years ago. She has a 14 pack-year smoking history. She has been exposed to tobacco smoke. She has never used smokeless tobacco. She reports that she does not currently use alcohol. She reports that she does not use drugs.  ALLERGIES/MEDS:  Allergies:  Allergies  Allergen Reactions   Caffeine Palpitations   Ciprofloxacin Other (See Comments)    Other reaction(s): itching   Codeine Itching   Meperidine  Hcl Other (See Comments)    Other reaction(s): hives   Ondansetron  Other (See Comments)    Other reaction(s): itching - can take with Benadryl    Phenergan  [Promethazine ] Other  (See Comments)    Other reaction(s): hives   Prednisone  Other (See Comments)    Other reaction(s): extreme dizziness   Septra [Sulfamethoxazole-Trimethoprim] Other (See Comments)    Other reaction(s): blurred vision, itching   Phenergan  [Promethazine  Hcl] Itching and Rash    No medications prior to admission.     Review of Systems  All other systems reviewed and are negative.   There were no vitals taken for this visit. Gen:  NAD, pleasant and cooperative Cardio:  RRR Pulm:  CTAB, no wheezes/rales/rhonchi Abd:  Soft, non-distended, non-tender throughout, no rebound/guarding Ext:  No bilateral LE edema, no bilateral calf tenderness Pelvic: Labia - unremarkable, vagina - pink moist mucosa, no lesions or abnormal discharge, cervix - no discharge or lesions or CMT, adnexa - no masses or tenderness - uterus non-tender and normal size on palpation  No results found for this or any previous visit (from the past 24 hours).  No results found.   ASSESSMENT/PLAN: Mia Rubio is a 43 y.o. G2P1001 who is admitted for Robotic Assisted Total Laparoscopic Hysterectomy and Bilateral Salpingectomy for AUB and POTS.  - Admit to [facility] Main OR - Admit labs (CBC, T&S) - Diet:  Per anesthesia/ERAS pathway - IVF:  Per anesthesia - VTE Prophylaxis:  SCDs - Antibiotics: Ancef  2g on call to OR  - D/C home likely POD#1  Consents: I have explained to the patient that his surgery is performed to remove the uterus through several small incisions in the abdomen and that it will result in sterility.  I discussed the risks and benefits of the surgery, including, but not limited to bleeding, including the need for blood transfusion, infection, damage to surround organs and tissues, damage to bladder, damage to ureters, causing kidney damage, and requiring additional procedures, damage to bowels, resulting in further surgery, postoperative pain, short-term and long-term, scarring on the abdominal wall and  intra-abdominally, need for further surgery, need for conversion to an open procedure, development of an incisional hernia, deep vein thrombosis, and/or pulmonary embolism, wound infection and/or separation, painful intercourse, urinary leakage, ovarian failure, resulting in menopausal symptoms requiring treatment, fistula formation, complications the course of which cannot be predicted or prevented, and death. Patient was consented for blood products.  The patient is aware that bleeding may result in the need for a blood transfusion which includes risk of transmission of HIV (1:2 million), Hepatitis C (1:2 million), and Hepatitis B (1:200 thousand) and transfusion reaction.  Patient voiced understanding of the above risks as well as understanding of indications for blood transfusion.   Jaiden Wahab, DO

## 2023-06-28 ENCOUNTER — Other Ambulatory Visit: Payer: Self-pay | Admitting: Internal Medicine

## 2023-07-02 ENCOUNTER — Encounter (HOSPITAL_COMMUNITY): Payer: Self-pay | Admitting: Obstetrics and Gynecology

## 2023-07-02 ENCOUNTER — Institutional Professional Consult (permissible substitution): Payer: Medicaid Other | Admitting: Neurology

## 2023-07-02 NOTE — Pre-Procedure Instructions (Addendum)
 Surgical Instructions   Your procedure is scheduled on :  Wednesday,  07-10-2023. Report to [facility] Main Entrance "A" at 6:30  A.M., then check in with the Admitting office. Any questions or running late day of surgery: call [phone]  Questions prior to your surgery date: call [phone], Monday-Friday, 8am-4pm. If you experience any cold or flu symptoms such as cough, fever, chills, shortness of breath, etc. between now and your scheduled surgery, please notify your surgeon office.    Remember:  Do not eat any food after midnight the night before your surgery.  You may have clear liquids from midnight night before surgery until 5:30 AM.   You may drink clear liquids until 5:30 AM the morning of your surgery.   Clear liquids allowed are:  Water  Carbonated Beverages Clear Tea (may sweeten, no milk, honey, etc.) Black Coffee Only (may sweeten, NO MILK, CREAM OR POWDERED CREAMER of any kind) Sport drinks, like Gatorade  NO clear liquids after 5:30 AM day of surgery.  This includes no water ,  candy,  gum,  and  mints.  (Unless taking medication after 5:30 AM w/ sips of water )    Take these medicines the morning of surgery with A SIPS OF WATER  : Alprazolam  ER (xanax  ) Gabapentin  (neurontin ) Venlafaxine  (effexor ) Ivabradine  (corlanor) Midodrine  (proamatine )  May take these medicines IF NEEDED: Ketorolac  (toradol ) injection Alprazolam  (xanax , 0.25) Spiriva  inhaler Symbicort  inhaler Meclizine  (antivert ) Ondansetron  (zofran ) Cetirizine  (zyrtec ) Fluticasone  (flonase ) nasal spray Rimegepant sulfate (nurtec) Rizatriptan  (maxalt ) Dicyclomine (bentyl)  ~~  Please bring both of your inhaler with you day of surgery   One week prior to surgery, STOP taking any Aspirin (unless otherwise instructed by your surgeon) Aleve, Naproxen, Ibuprofen , Motrin , Advil , Goody's, BC's, all herbal medications, fish oil, and non-prescription vitamins.                     Do NOT Smoke  (Tobacco/Vaping) and Do Not drink alcohol for 24 hours prior to your procedure.    You will be asked to remove any contacts, glasses, piercing's, hearing aid's, dentures/partials prior to surgery. Please bring cases for these items if needed.    Patients discharged the day of surgery will not be allowed to drive home, and someone needs to stay with them for 24 hours.  SURGICAL WAITING ROOM VISITATION Patients may have no more than 2 support people in the waiting area - these visitors may rotate.   Pre-op nurse will coordinate an appropriate time for 1 ADULT support person, who may not rotate, to accompany patient in pre-op.  Children under the age of 21 must have an adult with them who is not the patient and must remain in the main waiting area with an adult.  If the patient needs to stay at the hospital during part of their recovery, the visitor guidelines for inpatient rooms apply.  Please refer to the [facility] website for the visitor guidelines for any additional information.   If you received a COVID test during your pre-op visit  it is requested that you wear a mask when out in public, stay away from anyone that may not be feeling well and notify your surgeon if you develop symptoms. If you have been in contact with anyone that has tested positive in the last 10 days please notify you surgeon.      Pre-operative CHG Bathing Instructions   You can play a key role in reducing the risk of infection after surgery. Your skin needs  to be as free of germs as possible. You can reduce the number of germs on your skin by washing with CHG (chlorhexidine gluconate) soap before surgery. CHG is an antiseptic soap that kills germs and continues to kill germs even after washing.   DO NOT use if you have an allergy  to chlorhexidine/CHG or antibacterial soaps. If your skin becomes reddened or irritated, stop using the CHG and notify Pre-Op nurse day of surgery.   Please get dial soap or other  antibacterial soap and shower following the instructions below.             TAKE A SHOWER THE NIGHT BEFORE SURGERY AND THE DAY OF SURGERY    Please keep in mind the following:  DO NOT shave, including legs and underarms, 48 hours prior to surgery.   You may shave your face before/day of surgery.  Place clean sheets on your bed the night before surgery Use a clean washcloth (not used since being washed) for each shower. DO NOT sleep with pet's night before surgery.  CHG Shower Instructions:  Wash your face and private area with normal soap. If you choose to wash your hair, wash first with your normal shampoo.  After you use shampoo/soap, rinse your hair and body thoroughly to remove shampoo/soap residue.  Turn the water  OFF and apply half the bottle of CHG soap to a CLEAN washcloth.  Apply CHG soap ONLY FROM YOUR NECK DOWN TO YOUR TOES (washing for 3-5 minutes)  DO NOT use CHG soap on face, private areas, open wounds, or sores.  Pay special attention to the area where your surgery is being performed.  If you are having back surgery, having someone wash your back for you may be helpful. Wait 2 minutes after CHG soap is applied, then you may rinse off the CHG soap.  Pat dry with a clean towel  Put on clean pajamas    Additional instructions for the day of surgery: DO NOT APPLY any lotions, powder's, oils, deodorants (may use underarm deodorant), cologne/ perfumes or makeup Do not wear jewelry /  piercing's/  metal/  permanent jewelry must be removed prior to arrival day of surgery.  (No plastic piercing) Do not wear nail polish, gel polish, artificial nails, or any other type of covering on natural finger nails (toe nails are okay) Do not bring valuables to the hospital. [facility] is not responsible for valuables/personal belongings. Put on clean/comfortable clothes.  Please brush your teeth.  Ask your nurse before applying any prescription medications to the skin.

## 2023-07-02 NOTE — Progress Notes (Signed)
 Spoke w/ via phone for pre-op interview---  pt Lab needs dos----   urine preg      Lab results------ lab appt 07-03-2023 @ 1300 getting CBC/ T&S COVID test -----patient states asymptomatic no test needed Arrive at -------  0630 on 07-10-2023 NPO after MN NO Solid Food.  Clear liquids from MN until--- 0530 Pre-Surgery Ensure or G2:  n/a  Med rec completed Medications to take morning of surgery -----  gabapentin , xanax  er, midodrone, ivabradine , prn meds, pt stated it will work better to take effexor  in recovery Diabetic medication -----  n/a  GLP1 agonist last dose: n/a GLP1 instructions:  Patient instructed no nail polish to be worn day of surgery Patient instructed to bring photo id and insurance card day of surgery Patient aware to have Driver (ride ) / caregiver    for 24 hours after surgery - husband, joel Hsiung Patient Special Instructions -----  will pick up bag w/ soap and written instructions at lab appt Pre-Op special Instructions -----  patient has light sensitivity, can trigger migraine  Patient verbalized understanding of instructions that were given at this phone interview. Patient denies chest pain, sob, fever, cough at the interview.    Anesthesia Review:   08-22-2021 s/p ablation SVT w/ subsequent dysautonomia decompensation/ POTS/  palpitations;  multiple neurological symptoms;  GAD w/ panic disorder Trigeminal neuralgia/  chronic migraine Pt stated medication ivabradine  has controlled palpitations.  She will vape as needed if BP is low due to POTS.  Takes CBD gummies w/ THC at bedtime for sleep.   PCP: Dr Adelfa Homes Cardiologist : Dr Rodolfo Clan [facility] 06-17-2023 tele visit)  Chest x-ray : EKG :  05-15-2023 Echo : 03-08-2022 Stress test: no Cardiac Cath :   EP w/ ablation 08-22-2021 Event monitor:  04-05-2022 PFTs:  02-27-2022  Activity level:  denies sob w/ any activity Sleep Study/ CPAP :  negative sleep study 07-16-2022 epic Fasting Blood Sugar :      / Checks  Blood Sugar -- times a day:    Blood Thinner/ Instructions /Last Dose:  no ASA / Instructions/ Last Dose : no

## 2023-07-03 ENCOUNTER — Inpatient Hospital Stay (HOSPITAL_COMMUNITY): Admission: RE | Admit: 2023-07-03 | Source: Ambulatory Visit

## 2023-07-04 ENCOUNTER — Encounter (HOSPITAL_COMMUNITY)
Admission: RE | Admit: 2023-07-04 | Discharge: 2023-07-04 | Disposition: A | Discharge status: Home / Self Care | Source: Ambulatory Visit | Attending: Obstetrics and Gynecology | Admitting: Obstetrics and Gynecology

## 2023-07-04 ENCOUNTER — Encounter: Payer: Self-pay | Admitting: Internal Medicine

## 2023-07-04 DIAGNOSIS — N939 Abnormal uterine and vaginal bleeding, unspecified: Secondary | ICD-10-CM | POA: Diagnosis not present

## 2023-07-04 DIAGNOSIS — Z01812 Encounter for preprocedural laboratory examination: Secondary | ICD-10-CM | POA: Insufficient documentation

## 2023-07-04 DIAGNOSIS — G90A Postural orthostatic tachycardia syndrome (POTS): Secondary | ICD-10-CM | POA: Diagnosis not present

## 2023-07-04 LAB — CBC
HCT: 43.3 % (ref 36.0–46.0)
Hemoglobin: 14.4 g/dL (ref 12.0–15.0)
MCH: 29.9 pg (ref 26.0–34.0)
MCHC: 33.3 g/dL (ref 30.0–36.0)
MCV: 89.8 fL (ref 80.0–100.0)
Platelets: 254 10*3/uL (ref 150–400)
RBC: 4.82 MIL/uL (ref 3.87–5.11)
RDW: 12.7 % (ref 11.5–15.5)
WBC: 8 10*3/uL (ref 4.0–10.5)
nRBC: 0 % (ref 0.0–0.2)

## 2023-07-04 LAB — TYPE AND SCREEN
ABO/RH(D): O POS
Antibody Screen: NEGATIVE

## 2023-07-04 NOTE — Telephone Encounter (Signed)
 Patient identification verified by 2 forms. Hilton Lucky, RN    Called and spoke to patient  Patient states:   -heart rate elevating more frequently   -last night heart rate was 140, she felt really breathy   -noticed when heart rate is elevated she becomes lightheaded -Dr.  Rodolfo Clan previously recommended Beta Blocker, unsure if there is one that will not cause drowsiness  -has a procedure scheduled for 07/10/23 -will need new handicap form/signature  Patient scheduled for OV 5/9 at 10:55 AM with PA Lincoln Renshaw  Patient has no further questions at this time

## 2023-07-05 ENCOUNTER — Ambulatory Visit: Attending: Cardiology | Admitting: Cardiology

## 2023-07-05 ENCOUNTER — Encounter: Payer: Self-pay | Admitting: Cardiology

## 2023-07-05 ENCOUNTER — Ambulatory Visit

## 2023-07-05 VITALS — BP 132/80 | HR 93 | Ht 69.0 in | Wt 199.2 lb

## 2023-07-05 DIAGNOSIS — R0609 Other forms of dyspnea: Secondary | ICD-10-CM | POA: Insufficient documentation

## 2023-07-05 DIAGNOSIS — G90A Postural orthostatic tachycardia syndrome (POTS): Secondary | ICD-10-CM

## 2023-07-05 DIAGNOSIS — R42 Dizziness and giddiness: Secondary | ICD-10-CM

## 2023-07-05 MED ORDER — PROPRANOLOL HCL 20 MG PO TABS: 20.0000 mg | ORAL_TABLET | Freq: Two times a day (BID) | ORAL | 3 refills | Status: AC

## 2023-07-05 NOTE — Patient Instructions (Signed)
 Medication Instructions:  Start Propanolol 20 mg twice a day  *If you need a refill on your cardiac medications before your next appointment, please call your pharmacy*  Lab Work: No labs  Testing/Procedures: Delane Fear- Long Term Monitor Instructions  Your physician has requested you wear a ZIO patch monitor for 14 days.  This is a single patch monitor. [employer] supplies one patch monitor per enrollment. Additional stickers are not available. Please do not apply patch if you will be having a Nuclear Stress Test,  Echocardiogram, Cardiac CT, MRI, or Chest Xray during the period you would be wearing the  monitor. The patch cannot be worn during these tests. You cannot remove and re-apply the  ZIO XT patch monitor.  Your ZIO patch monitor will be mailed 3 day USPS to your address on file. It may take 3-5 days  to receive your monitor after you have been enrolled.  Once you have received your monitor, please review the enclosed instructions. Your monitor  has already been registered assigning a specific monitor serial # to you.  Billing and Patient Assistance Program Information  We have supplied [employer] with any of your insurance information on file for billing purposes. [employer] offers a sliding scale Patient Assistance Program for patients that do not have  insurance, or whose insurance does not completely cover the cost of the ZIO monitor.  You must apply for the Patient Assistance Program to qualify for this discounted rate.  To apply, please call [employer] at [phone], select option 4, select option 2, ask to apply for  Patient Assistance Program. Sanna Naomii will ask your household income, and how many people  are in your household. They will quote your out-of-pocket cost based on that information.  [employer] will also be able to set up a 34-month, interest-free payment plan if needed.  Applying the monitor   Shave hair from upper left chest.  Hold abrader disc by orange tab. Rub abrader  in 40 strokes over the upper left chest as  indicated in your monitor instructions.  Clean area with 4 enclosed alcohol pads. Let dry.  Apply patch as indicated in monitor instructions. Patch will be placed under collarbone on left  side of chest with arrow pointing upward.  Rub patch adhesive wings for 2 minutes. Remove white label marked "1". Remove the white  label marked "2". Rub patch adhesive wings for 2 additional minutes.  While looking in a mirror, press and release button in center of patch. A small green light will  flash 3-4 times. This will be your only indicator that the monitor has been turned on.  Do not shower for the first 24 hours. You may shower after the first 24 hours.  Press the button if you feel a symptom. You will hear a small click. Record Date, Time and  Symptom in the Patient Logbook.  When you are ready to remove the patch, follow instructions on the last 2 pages of Patient  Logbook. Stick patch monitor onto the last page of Patient Logbook.  Place Patient Logbook in the blue and white box. Use locking tab on box and tape box closed  securely. The blue and white box has prepaid postage on it. Please place it in the mailbox as  soon as possible. Your physician should have your test results approximately 7 days after the  monitor has been mailed back to [facility].  Call [facility] Customer Care at [phone] if you have questions regarding  your ZIO XT patch  monitor. Call them immediately if you see an orange light blinking on your  monitor.  If your monitor falls off in less than 4 days, contact our Monitor department at [phone].  If your monitor becomes loose or falls off after 4 days call [employer] at [phone] for  suggestions on securing your monitor  Follow-Up: At [facility], you and your health needs are our priority.  As part of our continuing mission to provide you with exceptional heart care, our providers are all part of  one team.  This team includes your primary Cardiologist (physician) and Advanced Practice Providers or APPs (Physician Assistants and Nurse Practitioners) who all work together to provide you with the care you need, when you need it.  Your next appointment:   scheduled  We recommend signing up for the patient portal called "MyChart".  Sign up information is provided on this After Visit Summary.  MyChart is used to connect with patients for Virtual Visits (Telemedicine).  Patients are able to view lab/test results, encounter notes, upcoming appointments, etc.  Non-urgent messages can be sent to your provider as well.   To learn more about what you can do with MyChart, go to [email or web address].

## 2023-07-05 NOTE — Progress Notes (Signed)
 Cardiology Office Note:  .   Date:  07/05/2023  ID:  Mia Rubio, DOB [birth year], MRN [id number] PCP: Ruven Coy, MD  [facility] Providers Cardiologist:  Richardo Chandler, MD    History of Present Illness: .   Mia Rubio is a 43 y.o. female with a past medical history of POTS, SVT, multiple neurologic and somatic symptoms, panic disorder, migraines. Per chart review, appears that patient has multiple neruological symptoms and is followed by a neurologist. She is followed by Dr. Rodolfo Clan and presents today for evaluation of elevated HR.   Per chart review, patient previously underwent SVT ablation in 07/2021. She had recurrent SVT that was treated with atenolol . She underwent echocardiogram 03/08/22 that showed EF 60-65%, no regional wall motion abnormalities, normal RV systolic function, no significant valvular abnormalities. She wore a cardiac monitor in 03/2022 that showed no significant arrhythmias. Triggered events correlated with sinus rhythm with HR 73-135 BPM.   Patient was last seen by Dr. Rodolfo Clan on 05/15/23. At that time, patient had a variety of complaints. These included zapping in her eyes, migraines. She was off beta-blockers. Dr. Rodolfo Clan recommended resuming, but patient did not want to make any medication changes.   Patient contacted the office 5/8 complaining of elevated HR. Reportedly had HR around 100 at rest. Had gotten up to 140 when going to the bathroom. An appointment was arranged for further evaluation.   Today, patient presents for evaluation of fluctuating heart rate and associated symptoms. Reports that after her SVT ablation in 07/2021, she had some nerve damage. Has struggled with POTS symptoms since. She experiences significant heart rate fluctuations, especially when transitioning from sitting to standing or bending over. She feels that the fluctuations in her HR lead to dyspnea and fatigue. Her symptoms have worsened recently. Gabapentin  initially helped reduce  heart rate fluctuations, but its effectiveness may be waning. Atenolol  was effective but caused excessive fatigue. She is considering other beta blockers that might not cause drowsiness.  She experiences significant breathlessness with minimal exertion, such as walking to the bathroom. This symptom has persisted since her SVT procedure and seems to have worsened over time. She was seen by pulm in the past and did not have any lung issues.  Her diet includes high-salt foods like meats, salami, and beef jerky to manage POTS symptoms. She drinks six bottles of Propel daily and supplements with water . She struggles with exercise due to her symptoms but is interested in trying low-impact activities like swimming or chair exercises   Studies Reviewed: .   Cardiac Studies & Procedures   ______________________________________________________________________________________________     ECHOCARDIOGRAM  ECHOCARDIOGRAM COMPLETE 03/08/2022  Narrative ECHOCARDIOGRAM REPORT    Patient Name:   Mia Rubio Date of Exam: 03/08/2022 Medical Rec #:  [id number]       Height:       69.0 in Accession #:    [id number]      Weight:       197.4 lb Date of Birth:  Jan 06, 1981       BSA:          2.055 m Patient Age:    41 years        BP:           124/68 mmHg Patient Gender: F               HR:           92 bpm. Exam Location:  [facility]  Procedure:  2D Echo, 3D Echo, Cardiac Doppler, Color Doppler and Strain Analysis  Indications:    R06.00 Dyspnea  History:        Patient has no prior history of Echocardiogram examinations.  Sonographer:    Brigid Canada RDCS Referring Phys: [phone] MATTHEW R HUNSUCKER  IMPRESSIONS   1. Left ventricular ejection fraction, by estimation, is 60 to 65%. Left ventricular ejection fraction by 3D volume is 59 %. The left ventricle has normal function. The left ventricle has no regional wall motion abnormalities. Left ventricular diastolic parameters were  normal. The average left ventricular global longitudinal strain is -25.6 %. The global longitudinal strain is normal. 2. Right ventricular systolic function is normal. The right ventricular size is normal. 3. The mitral valve is normal in structure. Trivial mitral valve regurgitation. No evidence of mitral stenosis. 4. The aortic valve is normal in structure. Aortic valve regurgitation is not visualized. No aortic stenosis is present. 5. The inferior vena cava is normal in size with greater than 50% respiratory variability, suggesting right atrial pressure of 3 mmHg.  FINDINGS Left Ventricle: Left ventricular ejection fraction, by estimation, is 60 to 65%. Left ventricular ejection fraction by 3D volume is 59 %. The left ventricle has normal function. The left ventricle has no regional wall motion abnormalities. The average left ventricular global longitudinal strain is -25.6 %. The global longitudinal strain is normal. The left ventricular internal cavity size was normal in size. There is no left ventricular hypertrophy. Left ventricular diastolic parameters were normal. Normal left ventricular filling pressure.  Right Ventricle: The right ventricular size is normal. No increase in right ventricular wall thickness. Right ventricular systolic function is normal.  Left Atrium: Left atrial size was normal in size.  Right Atrium: Right atrial size was normal in size.  Pericardium: There is no evidence of pericardial effusion.  Mitral Valve: The mitral valve is normal in structure. Trivial mitral valve regurgitation. No evidence of mitral valve stenosis.  Tricuspid Valve: The tricuspid valve is normal in structure. Tricuspid valve regurgitation is trivial. No evidence of tricuspid stenosis.  Aortic Valve: The aortic valve is normal in structure. Aortic valve regurgitation is not visualized. No aortic stenosis is present.  Pulmonic Valve: The pulmonic valve was normal in structure. Pulmonic valve  regurgitation is not visualized. No evidence of pulmonic stenosis.  Aorta: The aortic root is normal in size and structure.  Venous: The inferior vena cava is normal in size with greater than 50% respiratory variability, suggesting right atrial pressure of 3 mmHg.  IAS/Shunts: No atrial level shunt detected by color flow Doppler.   LEFT VENTRICLE PLAX 2D LVIDd:         4.60 cm         Diastology LVIDs:         2.40 cm         LV e' medial:    11.00 cm/s LV PW:         0.70 cm         LV E/e' medial:  8.2 LV IVS:        0.70 cm         LV e' lateral:   14.25 cm/s LVOT diam:     1.90 cm         LV E/e' lateral: 6.3 LV SV:         55 LV SV Index:   27              2D LVOT  Area:     2.84 cm        Longitudinal Strain 2D Strain GLS  -25.7 % (A2C): 2D Strain GLS  -26.7 % (A3C): 2D Strain GLS  -24.4 % (A4C): 2D Strain GLS  -25.6 % Avg:  3D Volume EF LV 3D EF:    Left ventricul ar ejection fraction by 3D volume is 59 %.  3D Volume EF: 3D EF:        59 % LV EDV:       135 ml LV ESV:       56 ml LV SV:        80 ml  RIGHT VENTRICLE             IVC RV Basal diam:  2.60 cm     IVC diam: 0.80 cm RV S prime:     15.75 cm/s TAPSE (M-mode): 2.3 cm  LEFT ATRIUM             Index        RIGHT ATRIUM          Index LA diam:        3.00 cm 1.46 cm/m   RA Area:     7.66 cm LA Vol (A2C):   32.2 ml 15.67 ml/m  RA Volume:   14.80 ml 7.20 ml/m LA Vol (A4C):   30.2 ml 14.70 ml/m LA Biplane Vol: 31.2 ml 15.19 ml/m AORTIC VALVE LVOT Vmax:   111.67 cm/s LVOT Vmean:  74.367 cm/s LVOT VTI:    0.192 m  AORTA Ao Root diam: 3.20 cm Ao Asc diam:  2.80 cm  MITRAL VALVE MV Area (PHT): 4.10 cm    SHUNTS MV Decel Time: 185 msec    Systemic VTI:  0.19 m MV E velocity: 89.70 cm/s  Systemic Diam: 1.90 cm MV A velocity: 74.10 cm/s MV E/A ratio:  1.21  Gaylyn Keas MD Electronically signed by Gaylyn Keas MD Signature Date/Time: 03/08/2022/5:19:41 PM    Final     MONITORS  LONG TERM MONITOR (3-14 DAYS) 04/05/2022  Narrative Patch Wear Time:  8 days and 10 hours (2024-01-20T02:43:40-0500 to 2024-01-28T12:58:08-498)  Patient had a min HR of 56 bpm, max HR of 163 bpm, and avg HR of 82 bpm. Predominant underlying rhythm was Sinus Rhythm. Isolated SVEs were rare (<1.0%), SVE Couplets were rare (<1.0%), and SVE Triplets were rare (<1.0%). Isolated VEs were rare (<1.0%), VE Couplets were rare (<1.0%), and no VE Triplets were present.  No significant arrhythmia Triggered events with sinus rhtyhm 73--135       ______________________________________________________________________________________________      Risk Assessment/Calculations:             Physical Exam:   VS:  BP 132/80 (BP Location: Left Arm, Patient Position: Sitting)   Pulse 93   Ht 5\' 9"  (1.753 m)   Wt 199 lb 3.2 oz (90.4 kg)   SpO2 99%   BMI 29.42 kg/m    Wt Readings from Last 3 Encounters:  07/05/23 199 lb 3.2 oz (90.4 kg)  06/12/23 196 lb (88.9 kg)  05/31/23 196 lb (88.9 kg)    GEN: Well nourished, well developed in no acute distress. Sitting comfortably on the exam table  NECK: No JVD  CARDIAC:  RRR, no murmurs, rubs, gallops. Radial pulses 2+ bilaterally  RESPIRATORY:  Clear to auscultation without rales, wheezing or rhonchi. Normal wob on room air  ABDOMEN: Soft, non-tender, non-distended EXTREMITIES:  No edema in BLE; No deformity  ASSESSMENT AND PLAN: .    POTS  SVT s/p Ablation  - Patient previously had SVT ablation in 07/2021. Has been followed by Dr. Rodolfo Clan since at least 2017 for POTS  - Was previously on atenolol , but this was stopped due to fatigue  - She also has several neurological conditions and follows with neurology. Her neurologist recently started her on gabapentin , which she thinks initially improved her POTS symptoms. However, her palpitations and HR variation has worsened. Notes that her HR increases significantly upon standing and this causes  her to feel very tired and short of breath  - Ordered 3 day zio with worsening HR variation  - Patient is willing to retry beta blockers, but hopes to find one that does not cause fatigue  - start propranolol 20 mg BID  - Discussed salt and protein intake. She does a good job staying hydrated, estimates she drinks about 6 propels per day and additional water  at meals. Encouraged use of compression stockings  - Encouraged her to start recumbent exercises such as exercise bike, rowing, swimming. Provided information on exercise program  - Encouraged her to continue to follow with her neurologist for her multiple neurologic symptoms and diagnoses  - Patient has a hysterectomy scheduled later this month. OK to proceed        Dispo: Follow up with Dr. Rodolfo Clan in 3 months   Signed, Debria Fang, PA-C

## 2023-07-05 NOTE — Telephone Encounter (Signed)
 Mia Rubio has been scheduled with Dr. Myrtie Atkinson for 02/03/2024 at 4:00 pm.

## 2023-07-05 NOTE — Progress Notes (Unsigned)
 Enrolled patient for a 3 day Zio XT monitor to be mailed to patients home   Mia Rubio to read

## 2023-07-06 ENCOUNTER — Encounter: Payer: Self-pay | Admitting: Cardiology

## 2023-07-10 ENCOUNTER — Observation Stay (HOSPITAL_COMMUNITY)
Admission: RE | Admit: 2023-07-10 | Discharge: 2023-07-10 | Disposition: A | Discharge status: Home / Self Care | Payer: Medicaid Other | Attending: Obstetrics and Gynecology | Admitting: Obstetrics and Gynecology

## 2023-07-10 ENCOUNTER — Ambulatory Visit (HOSPITAL_COMMUNITY): Payer: Self-pay

## 2023-07-10 ENCOUNTER — Encounter (HOSPITAL_COMMUNITY)
Admission: RE | Disposition: A | Discharge status: Home / Self Care | Payer: Self-pay | Source: Home / Self Care | Attending: Obstetrics and Gynecology

## 2023-07-10 ENCOUNTER — Encounter (HOSPITAL_COMMUNITY): Payer: Self-pay | Admitting: Obstetrics and Gynecology

## 2023-07-10 ENCOUNTER — Other Ambulatory Visit: Payer: Self-pay

## 2023-07-10 ENCOUNTER — Other Ambulatory Visit (HOSPITAL_COMMUNITY): Payer: Self-pay

## 2023-07-10 DIAGNOSIS — G901 Familial dysautonomia [Riley-Day]: Secondary | ICD-10-CM | POA: Insufficient documentation

## 2023-07-10 DIAGNOSIS — Z888 Allergy status to other drugs, medicaments and biological substances status: Secondary | ICD-10-CM

## 2023-07-10 DIAGNOSIS — G90A Postural orthostatic tachycardia syndrome (POTS): Secondary | ICD-10-CM | POA: Diagnosis present

## 2023-07-10 DIAGNOSIS — Z882 Allergy status to sulfonamides status: Secondary | ICD-10-CM | POA: Diagnosis not present

## 2023-07-10 DIAGNOSIS — Z7951 Long term (current) use of inhaled steroids: Secondary | ICD-10-CM

## 2023-07-10 DIAGNOSIS — G43909 Migraine, unspecified, not intractable, without status migrainosus: Secondary | ICD-10-CM | POA: Diagnosis present

## 2023-07-10 DIAGNOSIS — Z833 Family history of diabetes mellitus: Secondary | ICD-10-CM | POA: Diagnosis not present

## 2023-07-10 DIAGNOSIS — Z8249 Family history of ischemic heart disease and other diseases of the circulatory system: Secondary | ICD-10-CM

## 2023-07-10 DIAGNOSIS — Z79899 Other long term (current) drug therapy: Secondary | ICD-10-CM

## 2023-07-10 DIAGNOSIS — F419 Anxiety disorder, unspecified: Secondary | ICD-10-CM

## 2023-07-10 DIAGNOSIS — N939 Abnormal uterine and vaginal bleeding, unspecified: Secondary | ICD-10-CM | POA: Diagnosis present

## 2023-07-10 DIAGNOSIS — J45909 Unspecified asthma, uncomplicated: Secondary | ICD-10-CM

## 2023-07-10 DIAGNOSIS — Z885 Allergy status to narcotic agent status: Secondary | ICD-10-CM | POA: Diagnosis not present

## 2023-07-10 DIAGNOSIS — Z01818 Encounter for other preprocedural examination: Principal | ICD-10-CM

## 2023-07-10 DIAGNOSIS — Z87891 Personal history of nicotine dependence: Secondary | ICD-10-CM

## 2023-07-10 DIAGNOSIS — Z881 Allergy status to other antibiotic agents status: Secondary | ICD-10-CM

## 2023-07-10 DIAGNOSIS — Z808 Family history of malignant neoplasm of other organs or systems: Secondary | ICD-10-CM

## 2023-07-10 DIAGNOSIS — K219 Gastro-esophageal reflux disease without esophagitis: Secondary | ICD-10-CM | POA: Insufficient documentation

## 2023-07-10 DIAGNOSIS — N8003 Adenomyosis of the uterus: Secondary | ICD-10-CM | POA: Diagnosis not present

## 2023-07-10 HISTORY — DX: Other allergy status, other than to drugs and biological substances: Z91.09

## 2023-07-10 HISTORY — PX: HYSTERECTOMY, TOTAL, LAPAROSCOPIC, ROBOT-ASSISTED WITH SALPINGECTOMY: SHX7587

## 2023-07-10 HISTORY — DX: Chronic migraine with aura, intractable, without status migrainosus: G43.E19

## 2023-07-10 HISTORY — DX: Visual discomfort, unspecified: H53.149

## 2023-07-10 HISTORY — DX: Generalized anxiety disorder: F41.1

## 2023-07-10 HISTORY — DX: Insomnia, unspecified: G47.00

## 2023-07-10 HISTORY — DX: Allergic rhinitis, unspecified: J30.9

## 2023-07-10 HISTORY — DX: Unspecified disorder of vestibular function, unspecified ear: H81.90

## 2023-07-10 HISTORY — DX: Gastro-esophageal reflux disease without esophagitis: K21.9

## 2023-07-10 HISTORY — DX: Orthostatic hypotension: I95.1

## 2023-07-10 HISTORY — DX: Other constipation: K59.09

## 2023-07-10 HISTORY — DX: Personal history of other diseases of the female genital tract: Z87.42

## 2023-07-10 HISTORY — DX: Unspecified symptoms and signs involving the nervous system: R29.90

## 2023-07-10 HISTORY — DX: Unspecified asthma, uncomplicated: J45.909

## 2023-07-10 HISTORY — DX: Presence of spectacles and contact lenses: Z97.3

## 2023-07-10 HISTORY — DX: Abnormal uterine and vaginal bleeding, unspecified: N93.9

## 2023-07-10 HISTORY — DX: Trigeminal neuralgia: G50.0

## 2023-07-10 HISTORY — DX: Palpitations: R00.2

## 2023-07-10 LAB — POCT PREGNANCY, URINE: Preg Test, Ur: NEGATIVE

## 2023-07-10 SURGERY — HYSTERECTOMY, TOTAL, LAPAROSCOPIC, ROBOT-ASSISTED WITH SALPINGECTOMY
Anesthesia: General | Site: Abdomen | Laterality: Bilateral

## 2023-07-10 MED ORDER — METHYLENE BLUE (ANTIDOTE) 1 % IV SOLN
INTRAVENOUS | Status: AC
  Filled 2023-07-10: qty 10

## 2023-07-10 MED ORDER — LACTATED RINGERS IV SOLN: INTRAVENOUS | Status: DC

## 2023-07-10 MED ORDER — ONDANSETRON HCL 4 MG/2ML IJ SOLN
INTRAMUSCULAR | Status: AC
  Filled 2023-07-10: qty 2

## 2023-07-10 MED ORDER — HYDROMORPHONE HCL 1 MG/ML IJ SOLN
INTRAMUSCULAR | Status: AC
  Filled 2023-07-10: qty 1

## 2023-07-10 MED ORDER — SUGAMMADEX SODIUM 200 MG/2ML IV SOLN
INTRAVENOUS | Status: DC | PRN
  Administered 2023-07-10: 200 mg via INTRAVENOUS

## 2023-07-10 MED ORDER — MIDAZOLAM HCL 2 MG/2ML IJ SOLN
INTRAMUSCULAR | Status: DC | PRN
  Administered 2023-07-10: 2 mg via INTRAVENOUS

## 2023-07-10 MED ORDER — HYDROMORPHONE HCL 1 MG/ML IJ SOLN
INTRAMUSCULAR | Status: AC
  Filled 2023-07-10: qty 0.5

## 2023-07-10 MED ORDER — LIDOCAINE 2% (20 MG/ML) 5 ML SYRINGE
INTRAMUSCULAR | Status: AC
  Filled 2023-07-10: qty 5

## 2023-07-10 MED ORDER — RIMEGEPANT SULFATE 75 MG PO TBDP: 75.0000 mg | ORAL_TABLET | Freq: Every day | ORAL | Status: DC | PRN

## 2023-07-10 MED ORDER — HYDROMORPHONE HCL 1 MG/ML IJ SOLN
0.2500 mg | INTRAMUSCULAR | Status: DC | PRN
  Administered 2023-07-10 (×5): 0.5 mg via INTRAVENOUS

## 2023-07-10 MED ORDER — MORPHINE SULFATE (PF) 2 MG/ML IV SOLN: 1.0000 mg | INTRAVENOUS | Status: DC | PRN

## 2023-07-10 MED ORDER — FENTANYL CITRATE (PF) 250 MCG/5ML IJ SOLN
INTRAMUSCULAR | Status: AC
  Filled 2023-07-10: qty 5

## 2023-07-10 MED ORDER — ONDANSETRON HCL 4 MG/2ML IJ SOLN
4.0000 mg | Freq: Four times a day (QID) | INTRAMUSCULAR | Status: DC | PRN
Start: 2023-07-10 — End: 2023-07-10

## 2023-07-10 MED ORDER — PROPOFOL 10 MG/ML IV BOLUS
INTRAVENOUS | Status: AC
  Filled 2023-07-10: qty 20

## 2023-07-10 MED ORDER — EPHEDRINE 5 MG/ML INJ
INTRAVENOUS | Status: AC
  Filled 2023-07-10: qty 5

## 2023-07-10 MED ORDER — FENTANYL CITRATE (PF) 100 MCG/2ML IJ SOLN
INTRAMUSCULAR | Status: AC
  Filled 2023-07-10: qty 2

## 2023-07-10 MED ORDER — FENTANYL CITRATE (PF) 100 MCG/2ML IJ SOLN
25.0000 ug | INTRAMUSCULAR | Status: DC | PRN
  Administered 2023-07-10 (×2): 50 ug via INTRAVENOUS

## 2023-07-10 MED ORDER — CHLORHEXIDINE GLUCONATE 0.12 % MT SOLN
15.0000 mL | Freq: Once | OROMUCOSAL | Status: AC
  Administered 2023-07-10: 15 mL via OROMUCOSAL

## 2023-07-10 MED ORDER — IBUPROFEN 800 MG PO TABS
800.0000 mg | ORAL_TABLET | Freq: Three times a day (TID) | ORAL | 0 refills | Status: DC | PRN
  Filled 2023-07-10: qty 30, 10d supply, fill #0

## 2023-07-10 MED ORDER — MIDAZOLAM HCL 2 MG/2ML IJ SOLN
INTRAMUSCULAR | Status: AC
  Filled 2023-07-10: qty 2

## 2023-07-10 MED ORDER — GABAPENTIN 100 MG PO TABS: 100.0000 mg | ORAL_TABLET | Freq: Two times a day (BID) | ORAL | Status: DC

## 2023-07-10 MED ORDER — PHENYLEPHRINE HCL-NACL 20-0.9 MG/250ML-% IV SOLN
INTRAVENOUS | Status: DC | PRN
  Administered 2023-07-10: 15 ug/min via INTRAVENOUS

## 2023-07-10 MED ORDER — SODIUM CHLORIDE (PF) 0.9 % IJ SOLN
INTRAMUSCULAR | Status: AC
  Filled 2023-07-10: qty 10

## 2023-07-10 MED ORDER — ACETAMINOPHEN 500 MG PO TABS
ORAL_TABLET | ORAL | Status: AC
Start: 2023-07-10 — End: 2023-07-10
  Filled 2023-07-10: qty 2

## 2023-07-10 MED ORDER — ROPIVACAINE HCL 5 MG/ML IJ SOLN
INTRAMUSCULAR | Status: AC
  Filled 2023-07-10: qty 60

## 2023-07-10 MED ORDER — 0.9 % SODIUM CHLORIDE (POUR BTL) OPTIME
TOPICAL | Status: DC | PRN
  Administered 2023-07-10: 1000 mL

## 2023-07-10 MED ORDER — OXYCODONE HCL 5 MG PO TABS
5.0000 mg | ORAL_TABLET | ORAL | Status: DC | PRN
  Administered 2023-07-10 (×2): 5 mg via ORAL
  Filled 2023-07-10 (×3): qty 1

## 2023-07-10 MED ORDER — DIPHENHYDRAMINE HCL 50 MG/ML IJ SOLN
12.5000 mg | Freq: Once | INTRAMUSCULAR | Status: AC
  Administered 2023-07-10: 12.5 mg via INTRAVENOUS

## 2023-07-10 MED ORDER — HYDROMORPHONE HCL 1 MG/ML IJ SOLN
INTRAMUSCULAR | Status: DC | PRN
  Administered 2023-07-10: .5 mg via INTRAVENOUS

## 2023-07-10 MED ORDER — LACTATED RINGERS IV SOLN
INTRAVENOUS | Status: DC
Start: 2023-07-10 — End: 2023-07-10

## 2023-07-10 MED ORDER — DEXAMETHASONE SODIUM PHOSPHATE 10 MG/ML IJ SOLN
INTRAMUSCULAR | Status: DC | PRN
  Administered 2023-07-10: 10 mg via INTRAVENOUS

## 2023-07-10 MED ORDER — ORAL CARE MOUTH RINSE: 15.0000 mL | Freq: Once | OROMUCOSAL | Status: AC

## 2023-07-10 MED ORDER — FLUTICASONE PROPIONATE 50 MCG/ACT NA SUSP: 1.0000 | Freq: Every day | NASAL | Status: DC | PRN

## 2023-07-10 MED ORDER — SIMETHICONE 80 MG PO CHEW: 80.0000 mg | CHEWABLE_TABLET | Freq: Four times a day (QID) | ORAL | Status: DC | PRN

## 2023-07-10 MED ORDER — CHLORHEXIDINE GLUCONATE 0.12 % MT SOLN
OROMUCOSAL | Status: AC
  Filled 2023-07-10: qty 15

## 2023-07-10 MED ORDER — SODIUM CHLORIDE 0.9 % IV SOLN
INTRAVENOUS | Status: DC | PRN
  Administered 2023-07-10: 80 mL

## 2023-07-10 MED ORDER — CEFAZOLIN SODIUM-DEXTROSE 2-4 GM/100ML-% IV SOLN
2.0000 g | INTRAVENOUS | Status: AC
  Administered 2023-07-10: 2 g via INTRAVENOUS

## 2023-07-10 MED ORDER — ROCURONIUM BROMIDE 10 MG/ML (PF) SYRINGE
PREFILLED_SYRINGE | INTRAVENOUS | Status: AC
  Filled 2023-07-10: qty 10

## 2023-07-10 MED ORDER — RIZATRIPTAN BENZOATE 10 MG PO TBDP: 10.0000 mg | ORAL_TABLET | ORAL | Status: DC

## 2023-07-10 MED ORDER — SODIUM CHLORIDE 0.9 % IR SOLN
Status: DC | PRN
  Administered 2023-07-10: 1000 mL

## 2023-07-10 MED ORDER — SODIUM CHLORIDE (PF) 0.9 % IJ SOLN
INTRAMUSCULAR | Status: AC
  Filled 2023-07-10: qty 50

## 2023-07-10 MED ORDER — GABAPENTIN 100 MG PO CAPS: 100.0000 mg | ORAL_CAPSULE | Freq: Every day | ORAL | Status: DC

## 2023-07-10 MED ORDER — VASOPRESSIN 20 UNIT/ML IV SOLN
INTRAVENOUS | Status: AC
  Filled 2023-07-10: qty 1

## 2023-07-10 MED ORDER — VENLAFAXINE HCL ER 150 MG PO CP24
150.0000 mg | ORAL_CAPSULE | Freq: Every day | ORAL | Status: DC
  Filled 2023-07-10: qty 1

## 2023-07-10 MED ORDER — OXYCODONE HCL 5 MG PO TABS: 5.0000 mg | ORAL_TABLET | Freq: Once | ORAL | Status: DC | PRN

## 2023-07-10 MED ORDER — VENLAFAXINE HCL ER 75 MG PO CP24
75.0000 mg | ORAL_CAPSULE | Freq: Every day | ORAL | Status: DC
  Filled 2023-07-10: qty 1

## 2023-07-10 MED ORDER — CEFAZOLIN SODIUM-DEXTROSE 2-4 GM/100ML-% IV SOLN
INTRAVENOUS | Status: AC
  Filled 2023-07-10: qty 100

## 2023-07-10 MED ORDER — ONDANSETRON HCL 4 MG PO TABS
4.0000 mg | ORAL_TABLET | Freq: Four times a day (QID) | ORAL | Status: DC | PRN
Start: 2023-07-10 — End: 2023-07-10

## 2023-07-10 MED ORDER — MIDAZOLAM HCL 2 MG/2ML IJ SOLN
2.0000 mg | Freq: Once | INTRAMUSCULAR | Status: AC
  Administered 2023-07-10: 2 mg via INTRAVENOUS

## 2023-07-10 MED ORDER — DOCUSATE SODIUM 100 MG PO CAPS
100.0000 mg | ORAL_CAPSULE | Freq: Two times a day (BID) | ORAL | Status: DC
  Administered 2023-07-10: 100 mg via ORAL
  Filled 2023-07-10: qty 1

## 2023-07-10 MED ORDER — GABAPENTIN 300 MG PO CAPS: 300.0000 mg | ORAL_CAPSULE | Freq: Every day | ORAL | Status: DC

## 2023-07-10 MED ORDER — AMISULPRIDE (ANTIEMETIC) 5 MG/2ML IV SOLN: 10.0000 mg | Freq: Once | INTRAVENOUS | Status: DC | PRN

## 2023-07-10 MED ORDER — DEXAMETHASONE SODIUM PHOSPHATE 10 MG/ML IJ SOLN
INTRAMUSCULAR | Status: AC
  Filled 2023-07-10: qty 1

## 2023-07-10 MED ORDER — MIDODRINE HCL 5 MG PO TABS
7.5000 mg | ORAL_TABLET | Freq: Three times a day (TID) | ORAL | Status: DC
  Filled 2023-07-10 (×2): qty 1

## 2023-07-10 MED ORDER — TIOTROPIUM BROMIDE MONOHYDRATE 1.25 MCG/ACT IN AERS: 2.0000 | INHALATION_SPRAY | Freq: Every day | RESPIRATORY_TRACT | Status: DC | PRN

## 2023-07-10 MED ORDER — ACETAMINOPHEN 500 MG PO TABS
1000.0000 mg | ORAL_TABLET | Freq: Four times a day (QID) | ORAL | Status: DC
  Administered 2023-07-10: 1000 mg via ORAL
  Filled 2023-07-10: qty 2

## 2023-07-10 MED ORDER — PHENYLEPHRINE HCL-NACL 20-0.9 MG/250ML-% IV SOLN: INTRAVENOUS | Status: DC | PRN

## 2023-07-10 MED ORDER — FENTANYL CITRATE (PF) 250 MCG/5ML IJ SOLN
INTRAMUSCULAR | Status: DC | PRN
  Administered 2023-07-10: 100 ug via INTRAVENOUS
  Administered 2023-07-10 (×2): 50 ug via INTRAVENOUS

## 2023-07-10 MED ORDER — PROPOFOL 10 MG/ML IV BOLUS
INTRAVENOUS | Status: DC | PRN
  Administered 2023-07-10: 200 mg via INTRAVENOUS

## 2023-07-10 MED ORDER — ROCURONIUM BROMIDE 10 MG/ML (PF) SYRINGE
PREFILLED_SYRINGE | INTRAVENOUS | Status: DC | PRN
  Administered 2023-07-10: 50 mg via INTRAVENOUS
  Administered 2023-07-10 (×2): 20 mg via INTRAVENOUS
  Administered 2023-07-10: 5 mg via INTRAVENOUS

## 2023-07-10 MED ORDER — HEMOSTATIC AGENTS (NO CHARGE) OPTIME
TOPICAL | Status: DC | PRN
  Administered 2023-07-10: 1

## 2023-07-10 MED ORDER — SODIUM CHLORIDE (PF) 0.9 % IJ SOLN
INTRAMUSCULAR | Status: AC
  Filled 2023-07-10: qty 100

## 2023-07-10 MED ORDER — DIPHENHYDRAMINE HCL 50 MG/ML IJ SOLN
INTRAMUSCULAR | Status: AC
  Filled 2023-07-10: qty 1

## 2023-07-10 MED ORDER — LIDOCAINE 2% (20 MG/ML) 5 ML SYRINGE
INTRAMUSCULAR | Status: DC | PRN
  Administered 2023-07-10: 60 mg via INTRAVENOUS

## 2023-07-10 MED ORDER — ACETAMINOPHEN 500 MG PO TABS
1000.0000 mg | ORAL_TABLET | Freq: Once | ORAL | Status: AC
  Administered 2023-07-10: 1000 mg via ORAL

## 2023-07-10 MED ORDER — OXYCODONE HCL 5 MG PO TABS
5.0000 mg | ORAL_TABLET | Freq: Four times a day (QID) | ORAL | 0 refills | Status: DC | PRN
  Filled 2023-07-10: qty 20, 5d supply, fill #0

## 2023-07-10 MED ORDER — EPHEDRINE SULFATE-NACL 50-0.9 MG/10ML-% IV SOSY
PREFILLED_SYRINGE | INTRAVENOUS | Status: DC | PRN
  Administered 2023-07-10: 5 mg via INTRAVENOUS

## 2023-07-10 MED ORDER — MENTHOL 3 MG MT LOZG: 1.0000 | LOZENGE | OROMUCOSAL | Status: DC | PRN

## 2023-07-10 MED ORDER — OXYCODONE HCL 5 MG/5ML PO SOLN: 5.0000 mg | Freq: Once | ORAL | Status: DC | PRN

## 2023-07-10 MED ORDER — MECLIZINE HCL 25 MG PO TABS: 25.0000 mg | ORAL_TABLET | Freq: Three times a day (TID) | ORAL | Status: DC | PRN

## 2023-07-10 MED ORDER — PHENYLEPHRINE 80 MCG/ML (10ML) SYRINGE FOR IV PUSH (FOR BLOOD PRESSURE SUPPORT)
PREFILLED_SYRINGE | INTRAVENOUS | Status: AC
  Filled 2023-07-10: qty 10

## 2023-07-10 MED ORDER — FLUTICASONE FUROATE-VILANTEROL 200-25 MCG/ACT IN AEPB: 1.0000 | INHALATION_SPRAY | Freq: Every day | RESPIRATORY_TRACT | Status: DC

## 2023-07-10 MED ORDER — IVABRADINE HCL 5 MG PO TABS
7.5000 mg | ORAL_TABLET | Freq: Two times a day (BID) | ORAL | Status: DC
  Filled 2023-07-10 (×2): qty 1

## 2023-07-10 MED ORDER — IBUPROFEN 600 MG PO TABS
600.0000 mg | ORAL_TABLET | Freq: Four times a day (QID) | ORAL | Status: DC
  Administered 2023-07-10: 600 mg via ORAL
  Filled 2023-07-10: qty 1

## 2023-07-10 MED ORDER — DIPHENHYDRAMINE HCL 25 MG PO CAPS: 25.0000 mg | ORAL_CAPSULE | Freq: Four times a day (QID) | ORAL | Status: DC | PRN

## 2023-07-10 MED ORDER — PHENYLEPHRINE 80 MCG/ML (10ML) SYRINGE FOR IV PUSH (FOR BLOOD PRESSURE SUPPORT)
PREFILLED_SYRINGE | INTRAVENOUS | Status: DC | PRN
  Administered 2023-07-10: 80 ug via INTRAVENOUS
  Administered 2023-07-10: 160 ug via INTRAVENOUS

## 2023-07-10 SURGICAL SUPPLY — 89 items
APPLICATOR ARISTA FLEXITIP XL (MISCELLANEOUS) IMPLANT
BAG COUNTER SPONGE SURGICOUNT (BAG) ×1 IMPLANT
BARRIER ADHS 3X4 INTERCEED (GAUZE/BANDAGES/DRESSINGS) IMPLANT
BENZOIN TINCTURE PRP APPL 2/3 (GAUZE/BANDAGES/DRESSINGS) IMPLANT
CANNULA CAP OBTURATR AIRSEAL 8 (CAP) ×2 IMPLANT
CELLS DAT CNTRL 66122 CELL SVR (MISCELLANEOUS) IMPLANT
COVER BACK TABLE 60X90IN (DRAPES) ×2 IMPLANT
COVER TIP SHEARS 8 DVNC (MISCELLANEOUS) ×2 IMPLANT
DEFOGGER SCOPE WARM SEASHARP (MISCELLANEOUS) ×2 IMPLANT
DERMABOND ADVANCED .7 DNX12 (GAUZE/BANDAGES/DRESSINGS) ×2 IMPLANT
DILATOR CANAL MILEX (MISCELLANEOUS) IMPLANT
DRAPE ARM DVNC X/XI (DISPOSABLE) ×8 IMPLANT
DRAPE CESAREAN BIRTH W POUCH (DRAPES) ×2 IMPLANT
DRAPE COLUMN DVNC XI (DISPOSABLE) ×2 IMPLANT
DRAPE SURG IRRIG POUCH 19X23 (DRAPES) ×2 IMPLANT
DRAPE UTILITY W/TAPE 26X15 (DRAPES) ×2 IMPLANT
DRAPE WARM FLUID 44X44 (DRAPES) ×1 IMPLANT
DRIVER NDL MEGA 8 DVNC XI (INSTRUMENTS) ×1 IMPLANT
DRIVER NDLE MEGA DVNC XI (INSTRUMENTS) ×2 IMPLANT
DRSG OPSITE POSTOP 4X10 (GAUZE/BANDAGES/DRESSINGS) ×1 IMPLANT
DURAPREP 26ML APPLICATOR (WOUND CARE) ×2 IMPLANT
ELECTRODE REM PT RTRN 9FT ADLT (ELECTROSURGICAL) ×2 IMPLANT
FORCEPS BPLR LNG DVNC XI (INSTRUMENTS) ×2 IMPLANT
FORCEPS LONG TIP 8 DVNC XI (FORCEP) ×2 IMPLANT
FORCEPS PROGRASP DVNC XI (FORCEP) ×2 IMPLANT
GAUZE 4X4 16PLY ~~LOC~~+RFID DBL (SPONGE) ×2 IMPLANT
GLOVE BIO SURGEON STRL SZ 6.5 (GLOVE) ×6 IMPLANT
GLOVE BIOGEL PI IND STRL 6.5 (GLOVE) ×2 IMPLANT
GLOVE BIOGEL PI IND STRL 7.0 (GLOVE) ×10 IMPLANT
GLOVE SURG ENC TEXT LTX SZ6.5 (GLOVE) ×2 IMPLANT
GLOVE SURG UNDER POLY LF SZ7 (GLOVE) ×4 IMPLANT
GOWN STRL REUS W/ TWL LRG LVL3 (GOWN DISPOSABLE) ×6 IMPLANT
HEMOSTAT ARISTA ABSORB 3G PWDR (HEMOSTASIS) IMPLANT
HIBICLENS CHG 4% 4OZ BTL (MISCELLANEOUS) ×4 IMPLANT
IRRIGATION STRYKERFLOW (MISCELLANEOUS) ×2 IMPLANT
IV NS 1000ML BAXH (IV SOLUTION) ×1 IMPLANT
KIT PINK PAD W/HEAD ARE REST (MISCELLANEOUS) ×2 IMPLANT
KIT PINK PAD W/HEAD ARM REST (MISCELLANEOUS) ×2 IMPLANT
KIT TURNOVER KIT B (KITS) ×2 IMPLANT
LEGGING LITHOTOMY PAIR STRL (DRAPES) ×2 IMPLANT
MANIFOLD NEPTUNE II (INSTRUMENTS) ×2 IMPLANT
NDL HYPO 22X1.5 SAFETY MO (MISCELLANEOUS) IMPLANT
NEEDLE HYPO 22X1.5 SAFETY MO (MISCELLANEOUS) IMPLANT
NS IRRIG 1000ML POUR BTL (IV SOLUTION) ×1 IMPLANT
OBTURATOR OPTICALSTD 8 DVNC (TROCAR) ×2 IMPLANT
OCCLUDER COLPOPNEUMO (BALLOONS) ×2 IMPLANT
PACK ABDOMINAL GYN (CUSTOM PROCEDURE TRAY) ×2 IMPLANT
PACK ROBOT WH (CUSTOM PROCEDURE TRAY) ×2 IMPLANT
PACK ROBOTIC GOWN (GOWN DISPOSABLE) ×2 IMPLANT
PAD ARMBOARD POSITIONER FOAM (MISCELLANEOUS) ×2 IMPLANT
PAD OB MATERNITY 11 LF (PERSONAL CARE ITEMS) ×2 IMPLANT
PENCIL SMOKE EVACUATOR (MISCELLANEOUS) ×2 IMPLANT
POWDER SURGICEL 3.0 GRAM (HEMOSTASIS) ×1 IMPLANT
RETRACTOR WND ALEXIS 18 MED (MISCELLANEOUS) IMPLANT
SAVER CELL AAL HAEMONETICS (INSTRUMENTS) IMPLANT
SCISSORS LAP 5X45 EPIX DISP (ENDOMECHANICALS) ×1 IMPLANT
SCISSORS MNPLR CVD DVNC XI (INSTRUMENTS) ×2 IMPLANT
SEAL UNIV 5-12 XI (MISCELLANEOUS) ×8 IMPLANT
SEALER VESSEL EXT DVNC XI (MISCELLANEOUS) ×2 IMPLANT
SET IRRIG Y TYPE TUR BLADDER L (SET/KITS/TRAYS/PACK) IMPLANT
SET TUBE FILTERED XL AIRSEAL (SET/KITS/TRAYS/PACK) ×2 IMPLANT
SPIKE FLUID TRANSFER (MISCELLANEOUS) ×8 IMPLANT
SPONGE T-LAP 18X18 ~~LOC~~+RFID (SPONGE) ×2 IMPLANT
STRIP CLOSURE SKIN 1/2X4 (GAUZE/BANDAGES/DRESSINGS) IMPLANT
SUT MNCRL+ AB 3-0 CT1 36 (SUTURE) ×4 IMPLANT
SUT PDS AB 0 CT1 27 (SUTURE) ×4 IMPLANT
SUT VIC AB 0 CT1 27XBRD ANBCTR (SUTURE) ×4 IMPLANT
SUT VIC AB 0 CT1 27XCR 8 STRN (SUTURE) ×4 IMPLANT
SUT VIC AB 0 CT1 36 (SUTURE) ×2 IMPLANT
SUT VIC AB 2-0 CT1 (SUTURE) IMPLANT
SUT VIC AB 2-0 CT1 TAPERPNT 27 (SUTURE) ×2 IMPLANT
SUT VIC AB 2-0 SH 27XBRD (SUTURE) ×2 IMPLANT
SUT VIC AB 4-0 KS 27 (SUTURE) IMPLANT
SUT VIC AB 4-0 PS2 18 (SUTURE) ×1 IMPLANT
SUT VICRYL 0 TIES 12 18 (SUTURE) ×2 IMPLANT
SUT VICRYL 0 UR6 27IN ABS (SUTURE) IMPLANT
SUT VICRYL RAPIDE 4/0 PS 2 (SUTURE) ×4 IMPLANT
SUT VLOC 180 0 9IN GS21 (SUTURE) ×2 IMPLANT
SYR CONTROL 10ML LL (SYRINGE) IMPLANT
TIP ENDOSCOPIC SURGICEL (TIP) ×1 IMPLANT
TIP UTERINE 5.1X6CM LAV DISP (MISCELLANEOUS) ×1 IMPLANT
TIP UTERINE 6.7X10CM GRN DISP (MISCELLANEOUS) IMPLANT
TIP UTERINE 6.7X8CM BLUE DISP (MISCELLANEOUS) ×1 IMPLANT
TOWEL GREEN STERILE (TOWEL DISPOSABLE) ×2 IMPLANT
TOWEL GREEN STERILE FF (TOWEL DISPOSABLE) ×4 IMPLANT
TRAY FOLEY MTR SLVR 14FR STAT (SET/KITS/TRAYS/PACK) ×2 IMPLANT
TRAY FOLEY W/BAG SLVR 14FR (SET/KITS/TRAYS/PACK) ×2 IMPLANT
UNDERPAD 30X36 HEAVY ABSORB (UNDERPADS AND DIAPERS) ×2 IMPLANT
WATER STERILE IRR 1000ML POUR (IV SOLUTION) ×4 IMPLANT

## 2023-07-10 NOTE — Progress Notes (Signed)
   07/10/23 1856  Departure Condition  Departure Condition Good  Mobility at [employer]  Patient/Caregiver Teaching Teach Back Method Used;Discharge instructions reviewed;Prescriptions reviewed;Pain management discussed;Admission discussed;Follow-up care reviewed;Medications discussed;Patient/caregiver verbalized understanding  Departure Mode With family;With significant other  Was procedural sedation performed on this patient during this visit? Yes   Patient alert and oriented x4, VS and pain stable at discharge

## 2023-07-10 NOTE — Anesthesia Postprocedure Evaluation (Signed)
 Anesthesia Post Note  Patient: Mia Rubio  Procedure(s) Performed: HYSTERECTOMY, TOTAL, LAPAROSCOPIC, ROBOT-ASSISTED WITH SALPINGECTOMY (Bilateral: Abdomen)     Patient location during evaluation: PACU Anesthesia Type: General Level of consciousness: awake and alert Pain management: pain level controlled Vital Signs Assessment: post-procedure vital signs reviewed and stable Respiratory status: spontaneous breathing, nonlabored ventilation, respiratory function stable and patient connected to nasal cannula oxygen Cardiovascular status: blood pressure returned to baseline and stable Postop Assessment: no apparent nausea or vomiting Anesthetic complications: no  No notable events documented.  Last Vitals:  Vitals:   07/10/23 1312 07/10/23 1431  BP: 115/73 115/70  Pulse: 67 77  Resp: 14 16  Temp: 36.6 C 36.4 C  SpO2: 98% 95%    Last Pain:  Vitals:   07/10/23 1533  TempSrc:   PainSc: 7    Pain Goal: Patients Stated Pain Goal: 6 (07/10/23 0717)                 Myrla Asp L Annebelle Bostic

## 2023-07-10 NOTE — Anesthesia Procedure Notes (Signed)
 Procedure Name: Intubation Date/Time: 07/10/2023 8:39 AM  Performed by: Kaizley Aja C, CRNAPre-anesthesia Checklist: Patient identified, Emergency Drugs available, Suction available and Patient being monitored Patient Re-evaluated:Patient Re-evaluated prior to induction Oxygen Delivery Method: Circle system utilized Preoxygenation: Pre-oxygenation with 100% oxygen Induction Type: IV induction Ventilation: Mask ventilation without difficulty Laryngoscope Size: Mac and 3 Grade View: Grade I Tube type: Oral Number of attempts: 1 Airway Equipment and Method: Stylet and Oral airway Placement Confirmation: ETT inserted through vocal cords under direct vision, positive ETCO2 and breath sounds checked- equal and bilateral Secured at: 21 cm Tube secured with: Tape Dental Injury: Teeth and Oropharynx as per pre-operative assessment

## 2023-07-10 NOTE — Discharge Summary (Signed)
 Physician Discharge Summary  Patient ID: Mia Rubio MRN: [id number] DOB/AGE: [birth year] 43 y.o.  Admit date: 07/10/2023 Discharge date: 07/10/2023  Admission Diagnoses: Abnormal uterine bleeding POTS  Discharge Diagnoses:  Principal Problem:   Abnormal uterine bleeding (AUB) POTS  Procedure(s): Robotic Assisted Total Laparoscopic Hysterectomy and Bilateral Salpingectomy  Discharged Condition: good  Hospital Course: Patient was admitted on 07/10/2023 for the above named procedure(s) for the above named diagnoses. Prior to hospital discharge, patient was tolerating PO, ambulating, voiding spontaneously, passing flatus, and pain was well-controlled. She had a bowel movement as well. See hospital chart for specific details. Patient was discharged home in stable condition.  Consults: None  Significant Diagnostic Studies: None  Treatments: surgery: As documented above  Discharge Exam: Blood pressure 115/73, pulse 67, temperature 97.8 F (36.6 C), temperature source Oral, resp. rate 14, height 5\' 9"  (1.753 m), weight 88.5 kg, SpO2 98%. Gen:  NAD, pleasant and cooperative Cardio:  Regular rate Pulm:  Normal work of breathing Abd: Non-distended Ext:  No bilateral LE edema, no bilateral calf tenderness, SCDs on an working   Disposition: Discharge disposition: 01-Home or Self Care       Discharge Instructions     Call MD for:   Complete by: As directed    Spotting to very light bleeding is normal as the vaginal cuff is healing. Call with any heavy vaginal bleeding (filling up more than one large pad in an hour).   Call MD for:  difficulty breathing, headache or visual disturbances   Complete by: As directed    Call MD for:  extreme fatigue   Complete by: As directed    Call MD for:  hives   Complete by: As directed    Call MD for:  persistant dizziness or light-headedness   Complete by: As directed    Call MD for:  persistant nausea and vomiting   Complete by: As  directed    Call MD for:  redness, tenderness, or signs of infection (pain, swelling, redness, odor or green/yellow discharge around incision site)   Complete by: As directed    Call MD for:  severe uncontrolled pain   Complete by: As directed    Call MD for:  temperature >100.4   Complete by: As directed    Diet - low sodium heart healthy   Complete by: As directed    Driving Restrictions   Complete by: As directed    No driving for at least 2 weeks.   Increase activity slowly   Complete by: As directed    Lifting restrictions   Complete by: As directed    No lifting greater than 10lbs for 6 weeks.   Other Restrictions   Complete by: As directed    No baths or submersion in water  for at least 6 weeks.   Sexual Activity Restrictions   Complete by: As directed    No sexual intercourse or objects in the vagina for at least 6 weeks.      Allergies as of 07/10/2023       Reactions   Ciprofloxacin Itching   Codeine Itching   Meperidine  Hcl Hives   Ondansetron  Other (See Comments)   Other reaction(s): itching - can take with Benadryl    Prednisone  Other (See Comments)   Other reaction(s): extreme dizziness   Septra [sulfamethoxazole-trimethoprim] Other (See Comments)   Other reaction(s): blurred vision, itching   Phenergan  [promethazine ] Hives, Itching, Rash        Medication List  STOP taking these medications    Slynd 4 MG Tabs Generic drug: Drospirenone       TAKE these medications    acetaminophen  500 MG tablet Commonly known as: TYLENOL  Take 1,000 mg by mouth every 6 (six) hours as needed.   ALPRAZolam  1 MG 24 hr tablet Commonly known as: XANAX  XR Take 1 mg by mouth daily.   ALPRAZolam  0.25 MG tablet Commonly known as: XANAX  Take 3 tablets by mouth daily as needed for anxiety.   B-D 3CC LUER-LOK SYR 25GX1" 25G X 1" 3 ML Misc Generic drug: SYRINGE-NEEDLE (DISP) 3 ML Use with ketorolac    Botox  200 units injection Generic drug: botulinum toxin  Type A INJECT 155 UNITS INTRAMUSCULARLY TO HEAD AND NECK EVERY 12 WEEKS  (DISCARD UNUSED AFTER FIRST USE)   calcium carbonate 500 MG chewable tablet Commonly known as: TUMS - dosed in mg elemental calcium Chew 1 tablet by mouth as needed for indigestion or heartburn.   cetirizine  10 MG tablet Commonly known as: ZyrTEC  Allergy  Take 1 tablet (10 mg total) by mouth daily as needed for allergies.   dicyclomine 20 MG tablet Commonly known as: BENTYL Take 20 mg by mouth daily as needed for spasms.   Emgality  120 MG/ML Soaj Generic drug: Galcanezumab -gnlm Inject 120 mg into the skin every 30 (thirty) days.   fluticasone  50 MCG/ACT nasal spray Commonly known as: FLONASE  Place 1 spray into both nostrils daily as needed for allergies or rhinitis. What changed: how much to take   gabapentin  100 MG tablet Commonly known as: NEURONTIN  Take 100mg  twice day and 100-300mg  at bedtime What changed:  how much to take how to take this when to take this additional instructions   hydrocortisone 2.5 % cream Apply 1 Application topically 2 (two) times daily as needed.   ibuprofen  800 MG tablet Commonly known as: ADVIL  TAKE 1 TABLET(800 MG) BY MOUTH THREE TIMES DAILY AS NEEDED What changed: See the new instructions.   ibuprofen  800 MG tablet Commonly known as: ADVIL  Take 1 tablet (800 mg total) by mouth every 8 (eight) hours as needed. What changed: You were already taking a medication with the same name, and this prescription was added. Make sure you understand how and when to take each.   ivabradine  5 MG Tabs tablet Commonly known as: CORLANOR TAKE 1 AND 1/2 TABLETS BY MOUTH TWICE DAILY WITH MEALS What changed: See the new instructions.   ketorolac  60 MG/2ML Soln injection Commonly known as: TORADOL  Inject 1-2ml (30-60mg ) intramuscularly at onset of migraine. May repeat in 6 hours. Max twice a day and 4 days per month. What changed:  how much to take how to take this when to take  this   MAGNESIUM PO Take 500 mg by mouth at bedtime.   meclizine  25 MG tablet Commonly known as: ANTIVERT  TAKE 1 TABLET(25 MG) BY MOUTH THREE TIMES DAILY AS NEEDED FOR DIZZINESS What changed: See the new instructions.   midodrine  5 MG tablet Commonly known as: PROAMATINE  Take 1.5 tablets (7.5 mg total) by mouth 3 (three) times daily with meals. What changed: additional instructions   MULTIVITAMIN GUMMIES WOMENS PO Take 2 each by mouth daily.   Nurtec 75 MG Tbdp Generic drug: Rimegepant Sulfate Take 1 tablet (75 mg total) by mouth daily as needed (Migraines).   ondansetron  4 MG disintegrating tablet Commonly known as: ZOFRAN -ODT Take 1 tablet (4 mg total) by mouth every 8 (eight) hours as needed for nausea or vomiting (may take with benadryl  if  you have itching).   OVER THE COUNTER MEDICATION Take 4 each by mouth at bedtime. CBD gummies w/  THC  for sleep   oxyCODONE  5 MG immediate release tablet Commonly known as: Oxy IR/ROXICODONE  Take 1 tablet (5 mg total) by mouth every 6 (six) hours as needed for severe pain (pain score 7-10) or breakthrough pain.   propranolol 20 MG tablet Commonly known as: INDERAL Take 1 tablet (20 mg total) by mouth in the morning and at bedtime.   rizatriptan  10 MG disintegrating tablet Commonly known as: MAXALT -MLT Take 1 tablet (10 mg total) by mouth as needed for migraine. May repeat in 2 hours if needed. Do not exceed 2 tablets in 24 hours. Do not take more than 2-3 times per week. What changed: when to take this   Spiriva  Respimat 1.25 MCG/ACT Aers Generic drug: Tiotropium Bromide Monohydrate  Inhale 2 puffs into the lungs daily as needed.   Symbicort  160-4.5 MCG/ACT inhaler Generic drug: budesonide -formoterol  Inhale 2 puffs into the lungs 2 (two) times daily as needed.   triamcinolone  ointment 0.1 % Commonly known as: KENALOG  Apply twice daily for flare ups below neck, maximum 10 days. What changed:  how much to take how to take  this when to take this reasons to take this   venlafaxine  XR 150 MG 24 hr capsule Commonly known as: EFFEXOR -XR Take 1 capsule (150 mg total) by mouth daily with breakfast. Take with 75 mg for total of 225 mg daily. What changed: additional instructions   venlafaxine  XR 75 MG 24 hr capsule Commonly known as: EFFEXOR -XR Take 1 capsule (75 mg total) by mouth daily with breakfast. Take with 150 mg for total of 225 mg daily. What changed: additional instructions   zolpidem  10 MG tablet Commonly known as: AMBIEN  Take 10 mg by mouth at bedtime.         Follow-up Information     Meldon Sport, DO Follow up in 2 week(s).   Specialty: Obstetrics and Gynecology Why: Please keep your 2 week and 6 week post-operative visit as previously scheduled. Contact information: [address] [place] [address] [phone]                 Signed: Meldon Sport 07/10/2023, 2:22 PM

## 2023-07-10 NOTE — Anesthesia Preprocedure Evaluation (Addendum)
 Anesthesia Evaluation  Patient identified by MRN, date of birth, ID band Patient awake    Reviewed: Allergy  & Precautions, NPO status , Patient's Chart, lab work & pertinent test results, reviewed documented beta blocker date and time   Airway Mallampati: I  TM Distance: >3 FB Neck ROM: Full    Dental no notable dental hx. (+) Teeth Intact, Dental Advisory Given   Pulmonary asthma , former smoker   Pulmonary exam normal breath sounds clear to auscultation       Cardiovascular Normal cardiovascular exam+ dysrhythmias Supra Ventricular Tachycardia  Rhythm:Regular Rate:Normal  Dysautonomia orthostatic hypotension syndrome  TTE 2024 1. Left ventricular ejection fraction, by estimation, is 60 to 65%. Left  ventricular ejection fraction by 3D volume is 59 %. The left ventricle has  normal function. The left ventricle has no regional wall motion  abnormalities. Left ventricular diastolic   parameters were normal. The average left ventricular global longitudinal  strain is -25.6 %. The global longitudinal strain is normal.   2. Right ventricular systolic function is normal. The right ventricular  size is normal.   3. The mitral valve is normal in structure. Trivial mitral valve  regurgitation. No evidence of mitral stenosis.   4. The aortic valve is normal in structure. Aortic valve regurgitation is  not visualized. No aortic stenosis is present.   5. The inferior vena cava is normal in size with greater than 50%  respiratory variability, suggesting right atrial pressure of 3 mmHg.      Neuro/Psych  Headaches PSYCHIATRIC DISORDERS Anxiety        GI/Hepatic Neg liver ROS,GERD  ,,  Endo/Other  negative endocrine ROS    Renal/GU negative Renal ROS  negative genitourinary   Musculoskeletal negative musculoskeletal ROS (+)    Abdominal   Peds  Hematology negative hematology ROS (+)   Anesthesia Other Findings    Reproductive/Obstetrics                             Anesthesia Physical Anesthesia Plan  ASA: 2  Anesthesia Plan: General   Post-op Pain Management: Tylenol  PO (pre-op)* and Toradol  IV (intra-op)*   Induction: Intravenous  PONV Risk Score and Plan: 3 and Midazolam  and Dexamethasone   Airway Management Planned: Oral ETT  Additional Equipment:   Intra-op Plan:   Post-operative Plan: Extubation in OR  Informed Consent: I have reviewed the patients History and Physical, chart, labs and discussed the procedure including the risks, benefits and alternatives for the proposed anesthesia with the patient or authorized representative who has indicated his/her understanding and acceptance.     Dental advisory given  Plan Discussed with: CRNA  Anesthesia Plan Comments: (2 IVs)       Anesthesia Quick Evaluation

## 2023-07-10 NOTE — Interval H&P Note (Signed)
 History and Physical Interval Note:  07/10/2023 8:09 AM  Mia Rubio  has presented today for surgery, with the diagnosis of Abnormal Uterine Bleeding and POTS.  The various methods of treatment have been discussed with the patient and family. After consideration of risks, benefits and other options for treatment, the patient has consented to  Procedure(s): HYSTERECTOMY, TOTAL, LAPAROSCOPIC, ROBOT-ASSISTED WITH SALPINGECTOMY (Bilateral) POSSIBLE HYSTERECTOMY, TOTAL, ABDOMINAL, WITH SALPINGECTOMY (Bilateral)  as a surgical intervention.  The patient's history has been reviewed, patient examined, no change in status, stable for surgery.  I have reviewed the patient's chart and labs.  Questions were answered to the patient's satisfaction.     Zaid Tomes

## 2023-07-10 NOTE — Transfer of Care (Signed)
 Immediate Anesthesia Transfer of Care Note  Patient: Mia Rubio  Procedure(s) Performed: HYSTERECTOMY, TOTAL, LAPAROSCOPIC, ROBOT-ASSISTED WITH SALPINGECTOMY (Bilateral: Abdomen)  Patient Location: PACU  Anesthesia Type:General  Level of Consciousness: awake and sedated  Airway & Oxygen Therapy: Patient Spontanous Breathing and Patient connected to face mask oxygen  Post-op Assessment: Report given to RN and Post -op Vital signs reviewed and stable  Post vital signs: Reviewed and stable  Last Vitals:  Vitals Value Taken Time  BP 128/89 07/10/23 1055  Temp 36.6 C 07/10/23 1053  Pulse 58 07/10/23 1056  Resp 17 07/10/23 1056  SpO2 93 % 07/10/23 1056  Vitals shown include unfiled device data.  Last Pain:  Vitals:   07/10/23 0717  TempSrc: Oral  PainSc: 0-No pain      Patients Stated Pain Goal: 6 (07/10/23 0717)  Complications: No notable events documented.

## 2023-07-10 NOTE — Op Note (Signed)
 Pre Op Dx:   1. Abnormal uterine bleeding 2. POTS  Post Op Dx:   Same as pre-operative diagnoses  Procedure:   Robotic Assisted Total Laparoscopic Hysterectomy and Bilateral Salpingectomy    Surgeon:  Dr. Meldon Sport Assistants:  Dr. Arlee Lace and Estrella Hench, RNFA Anesthesia:  General   EBL:  50cc  IVF:  900cc UOP:  250cc clear yellow urine   Drains:  Foley catheter Specimen removed:  Uterus, cervix, and bilateral fallopian tubes - sent to pathology Device(s) implanted: None Case Type:  Clean-contaminated Findings:  Normal-appearing uterus, bilateral fallopian tubes, and ovaries. A thin adhesion band between the omentum and anterior abdominal wall. Normal-appearing liver contours and gallbladder. Bilateral ureters visualized with peristalsis pre and post hysterectomy and after closure of vaginal cuff. Complications: None Indications:  43 y.o. G2P1 with AUB and POTS who desires definitive surgical management.  Description of each procedure:  After informed consent the patient was taken to the operating room and placed in dorsal supine position where general endotracheal anesthesia was administered and found to be adequate.  She was placed in dorsal lithotomy position with her arms tucked.  She was prepped and draped in the usual sterile fashion.  A timeout was called and the procedure confirmed.  A RUMI uterine manipulator with the Koh cup and a Foley catheter were placed.   A 8mm supraumbilical incision was made and a trochar was used to enter the abdomen under direct visualization.  Pneumoperitoneum was established and atraumatic entry confirmed. Two additional 8mm ports were placed on either side of the umbilicus under direct visualization. All port sites were injected with 10cc local anesthetic. The pelvis was bathed in a 60cc Ropivicaine solution.  The patient was placed in Trendelenburg position and the [employer] robotic device was docked.  Next, attention was turned to  the console where the hysterectomy was performed. The adhesion band as mentioned above was taken down easily with electrosurgery. The right fallopian tube was divided at the mesosalpinx and the uteroovarian anastamosis was divided and the right round ligament was divided. This process was repeated on the contralateral side.  The anterior leaflet of the broad ligament was divided to create a bladder flap. The uterine artery and vein were skeletonized and desiccated superior to the Koh cup.  This process was repeated on the contralateral side.  Uterine blanching was observed.  A circumferential colpotomy was created along the ridge of the Koh cup and the uterus was passed off the field.  The vaginal occluder was placed in the vagina to maintain pneumoperitoneum.  The vaginal cuff was then closed with V-loc suture. Hemostasis confirmed. Surgicel hemostatic powder was placed on the vaginal cuff and adnexa bilaterally. Suction-irrigation performed. Hemostasis noted. The [employer] robotic device was undocked and all ports were visualized.   The pneumoperitoneum was reduced completely with the assistance of two deep breaths and all ports were removed.  The skin was closed with 4-0 Vicryl in subcuticular fashion with skin glue placed atop each port site.   The vagina was inspected and there were no vaginal tears noted and no foreign objects remaining in the vagina. The patient was returned to dorsal supine position, awakened and extubated in the OR having appeared to tolerate the procedure well.  All sponge, needle, and instrument counts were correct x 2 at the end of the case.  Disposition:  PACU  Meldon Sport, DO

## 2023-07-10 NOTE — Plan of Care (Signed)

## 2023-07-10 NOTE — Plan of Care (Signed)
 Problem: Education: Goal: Knowledge of General Education information will improve Description: Including pain rating scale, medication(s)/side effects and non-pharmacologic comfort measures 07/10/2023 1856 by Keary Passey, RN Outcome: Adequate for Discharge 07/10/2023 1723 by Keary Passey, RN Outcome: Adequate for Discharge   Problem: Health Behavior/Discharge Planning: Goal: Ability to manage health-related needs will improve 07/10/2023 1856 by Keary Passey, RN Outcome: Adequate for Discharge 07/10/2023 1723 by Keary Passey, RN Outcome: Adequate for Discharge   Problem: Clinical Measurements: Goal: Ability to maintain clinical measurements within normal limits will improve 07/10/2023 1856 by Keary Passey, RN Outcome: Adequate for Discharge 07/10/2023 1723 by Keary Passey, RN Outcome: Adequate for Discharge Goal: Will remain free from infection 07/10/2023 1856 by Keary Passey, RN Outcome: Adequate for Discharge 07/10/2023 1723 by Keary Passey, RN Outcome: Adequate for Discharge Goal: Diagnostic test results will improve 07/10/2023 1856 by Keary Passey, RN Outcome: Adequate for Discharge 07/10/2023 1723 by Keary Passey, RN Outcome: Adequate for Discharge Goal: Respiratory complications will improve 07/10/2023 1856 by Keary Passey, RN Outcome: Adequate for Discharge 07/10/2023 1723 by Keary Passey, RN Outcome: Adequate for Discharge Goal: Cardiovascular complication will be avoided 07/10/2023 1856 by Keary Passey, RN Outcome: Adequate for Discharge 07/10/2023 1723 by Keary Passey, RN Outcome: Adequate for Discharge   Problem: Activity: Goal: Risk for activity intolerance will decrease 07/10/2023 1856 by Keary Passey, RN Outcome: Adequate for Discharge 07/10/2023 1723 by Keary Passey, RN Outcome: Adequate for Discharge   Problem: Nutrition: Goal: Adequate nutrition will be  maintained 07/10/2023 1856 by Keary Passey, RN Outcome: Adequate for Discharge 07/10/2023 1723 by Keary Passey, RN Outcome: Adequate for Discharge   Problem: Coping: Goal: Level of anxiety will decrease 07/10/2023 1856 by Keary Passey, RN Outcome: Adequate for Discharge 07/10/2023 1723 by Keary Passey, RN Outcome: Adequate for Discharge   Problem: Elimination: Goal: Will not experience complications related to bowel motility 07/10/2023 1856 by Keary Passey, RN Outcome: Adequate for Discharge 07/10/2023 1723 by Keary Passey, RN Outcome: Adequate for Discharge Goal: Will not experience complications related to urinary retention 07/10/2023 1856 by Keary Passey, RN Outcome: Adequate for Discharge 07/10/2023 1723 by Keary Passey, RN Outcome: Adequate for Discharge   Problem: Pain Managment: Goal: General experience of comfort will improve and/or be controlled 07/10/2023 1856 by Keary Passey, RN Outcome: Adequate for Discharge 07/10/2023 1723 by Keary Passey, RN Outcome: Adequate for Discharge   Problem: Safety: Goal: Ability to remain free from injury will improve 07/10/2023 1856 by Keary Passey, RN Outcome: Adequate for Discharge 07/10/2023 1723 by Keary Passey, RN Outcome: Adequate for Discharge   Problem: Skin Integrity: Goal: Risk for impaired skin integrity will decrease 07/10/2023 1856 by Keary Passey, RN Outcome: Adequate for Discharge 07/10/2023 1723 by Keary Passey, RN Outcome: Adequate for Discharge   Problem: Education: Goal: Knowledge of the prescribed therapeutic regimen will improve 07/10/2023 1856 by Keary Passey, RN Outcome: Adequate for Discharge 07/10/2023 1723 by Keary Passey, RN Outcome: Adequate for Discharge Goal: Understanding of sexual limitations or changes related to disease process or condition will improve 07/10/2023 1856 by Keary Passey, RN Outcome:  Adequate for Discharge 07/10/2023 1723 by Keary Passey, RN Outcome: Adequate for Discharge Goal: Individualized Educational Video(s) 07/10/2023 1856 by Keary Passey, RN Outcome: Adequate for Discharge 07/10/2023 1723 by Keary Passey, RN Outcome: Adequate for Discharge  Problem: Self-Concept: Goal: Communication of feelings regarding changes in body function or appearance will improve 07/10/2023 1856 by Keary Passey, RN Outcome: Adequate for Discharge 07/10/2023 1723 by Keary Passey, RN Outcome: Adequate for Discharge   Problem: Skin Integrity: Goal: Demonstration of wound healing without infection will improve 07/10/2023 1856 by Keary Passey, RN Outcome: Adequate for Discharge 07/10/2023 1723 by Keary Passey, RN Outcome: Adequate for Discharge

## 2023-07-11 ENCOUNTER — Encounter (HOSPITAL_COMMUNITY): Payer: Self-pay | Admitting: Obstetrics and Gynecology

## 2023-07-11 LAB — SURGICAL PATHOLOGY

## 2023-07-16 ENCOUNTER — Emergency Department (HOSPITAL_BASED_OUTPATIENT_CLINIC_OR_DEPARTMENT_OTHER)
Admission: EM | Admit: 2023-07-16 | Discharge: 2023-07-16 | Disposition: A | Discharge status: Home / Self Care | Attending: Emergency Medicine | Admitting: Emergency Medicine

## 2023-07-16 ENCOUNTER — Other Ambulatory Visit: Payer: Self-pay

## 2023-07-16 ENCOUNTER — Emergency Department (HOSPITAL_BASED_OUTPATIENT_CLINIC_OR_DEPARTMENT_OTHER)

## 2023-07-16 ENCOUNTER — Encounter (HOSPITAL_BASED_OUTPATIENT_CLINIC_OR_DEPARTMENT_OTHER): Payer: Self-pay | Admitting: Emergency Medicine

## 2023-07-16 DIAGNOSIS — G8918 Other acute postprocedural pain: Secondary | ICD-10-CM | POA: Diagnosis not present

## 2023-07-16 DIAGNOSIS — R109 Unspecified abdominal pain: Secondary | ICD-10-CM | POA: Diagnosis present

## 2023-07-16 DIAGNOSIS — Z87891 Personal history of nicotine dependence: Secondary | ICD-10-CM | POA: Diagnosis not present

## 2023-07-16 LAB — COMPREHENSIVE METABOLIC PANEL WITH GFR
ALT: 107 U/L — ABNORMAL HIGH (ref 0–44)
AST: 74 U/L — ABNORMAL HIGH (ref 15–41)
Albumin: 4.3 g/dL (ref 3.5–5.0)
Alkaline Phosphatase: 69 U/L (ref 38–126)
Anion gap: 13 (ref 5–15)
BUN: 12 mg/dL (ref 6–20)
CO2: 24 mmol/L (ref 22–32)
Calcium: 10.1 mg/dL (ref 8.9–10.3)
Chloride: 101 mmol/L (ref 98–111)
Creatinine, Ser: 0.79 mg/dL (ref 0.44–1.00)
GFR, Estimated: >60 mL/min (ref >60)
Glucose, Bld: 89 mg/dL (ref 70–99)
Potassium: 4 mmol/L (ref 3.5–5.1)
Sodium: 138 mmol/L (ref 135–145)
Total Bilirubin: 0.2 mg/dL (ref 0.0–1.2)
Total Protein: 7.3 g/dL (ref 6.5–8.1)

## 2023-07-16 LAB — URINALYSIS, ROUTINE W REFLEX MICROSCOPIC
Bilirubin Urine: NEGATIVE
Glucose, UA: NEGATIVE mg/dL
Hgb urine dipstick: NEGATIVE
Ketones, ur: NEGATIVE mg/dL
Leukocytes,Ua: NEGATIVE
Nitrite: NEGATIVE
Protein, ur: NEGATIVE mg/dL
Specific Gravity, Urine: 1.005 (ref 1.005–1.030)
pH: 5.5 (ref 5.0–8.0)

## 2023-07-16 LAB — CBC WITH DIFFERENTIAL/PLATELET
Abs Immature Granulocytes: 0.04 10*3/uL (ref 0.00–0.07)
Basophils Absolute: 0.1 10*3/uL (ref 0.0–0.1)
Basophils Relative: 1 %
Eosinophils Absolute: 0.4 10*3/uL (ref 0.0–0.5)
Eosinophils Relative: 5 %
HCT: 39.8 % (ref 36.0–46.0)
Hemoglobin: 13.4 g/dL (ref 12.0–15.0)
Immature Granulocytes: 1 %
Lymphocytes Relative: 20 %
Lymphs Abs: 1.7 10*3/uL (ref 0.7–4.0)
MCH: 30.7 pg (ref 26.0–34.0)
MCHC: 33.7 g/dL (ref 30.0–36.0)
MCV: 91.1 fL (ref 80.0–100.0)
Monocytes Absolute: 0.5 10*3/uL (ref 0.1–1.0)
Monocytes Relative: 6 %
Neutro Abs: 5.7 10*3/uL (ref 1.7–7.7)
Neutrophils Relative %: 67 %
Platelets: 235 10*3/uL (ref 150–400)
RBC: 4.37 MIL/uL (ref 3.87–5.11)
RDW: 13.6 % (ref 11.5–15.5)
WBC: 8.4 10*3/uL (ref 4.0–10.5)
nRBC: 0 % (ref 0.0–0.2)

## 2023-07-16 LAB — LIPASE, BLOOD: Lipase: 28 U/L (ref 11–51)

## 2023-07-16 MED ORDER — MORPHINE SULFATE (PF) 4 MG/ML IV SOLN
4.0000 mg | Freq: Once | INTRAVENOUS | Status: AC
  Administered 2023-07-16: 4 mg via INTRAVENOUS
  Filled 2023-07-16: qty 1

## 2023-07-16 MED ORDER — OXYCODONE HCL 5 MG PO TABS: 5.0000 mg | ORAL_TABLET | Freq: Four times a day (QID) | ORAL | 0 refills | Status: AC | PRN

## 2023-07-16 MED ORDER — IOHEXOL 300 MG/ML  SOLN
100.0000 mL | Freq: Once | INTRAMUSCULAR | Status: AC | PRN
  Administered 2023-07-16: 100 mL via INTRAVENOUS

## 2023-07-16 NOTE — ED Provider Notes (Signed)
 [place] EMERGENCY DEPARTMENT AT [facility] Provider Note   CSN: [id number] Arrival date & time: 07/16/23  2016     History  Chief Complaint  Patient presents with   Abdominal Pain    Mia Rubio is a 43 y.o. female.  Patient to ED with right flank extending to right abdomen pain that started 3 days ago and has gotten progressively worse since that time. No fever, nausea or vomiting. She underwent laparoscopic hysterectomy on 5/14. No vaginal bleeding. She has moved her bowels and reports nonbloody loose stools. No urinary symptoms.   The history is provided by the patient. No language interpreter was used.  Abdominal Pain      Home Medications Prior to Admission medications   Medication Sig Start Date End Date Taking? Authorizing Provider  acetaminophen  (TYLENOL ) 500 MG tablet Take 1,000 mg by mouth every 6 (six) hours as needed.    [provider]  ALPRAZolam  (XANAX  XR) 1 MG 24 hr tablet Take 1 mg by mouth daily.    [provider]  ALPRAZolam  (XANAX ) 0.25 MG tablet Take 3 tablets by mouth daily as needed for anxiety.    [provider]  botulinum toxin Type A  (BOTOX ) 200 units injection INJECT 155 UNITS INTRAMUSCULARLY TO HEAD AND NECK EVERY 12 WEEKS  (DISCARD UNUSED AFTER FIRST USE) Patient not taking: Reported on 07/02/2023 01/08/23   Glory Larsen, MD  calcium carbonate (TUMS - DOSED IN MG ELEMENTAL CALCIUM) 500 MG chewable tablet Chew 1 tablet by mouth as needed for indigestion or heartburn.    [provider]  cetirizine  (ZYRTEC  ALLERGY ) 10 MG tablet Take 1 tablet (10 mg total) by mouth daily as needed for allergies. 06/07/23   Kandice Orleans, MD  dicyclomine (BENTYL) 20 MG tablet Take 20 mg by mouth daily as needed for spasms.    [provider]  fluticasone  (FLONASE ) 50 MCG/ACT nasal spray Place 1 spray into both nostrils daily as needed for allergies or rhinitis. Patient taking differently: Place 1-2 sprays  into both nostrils daily as needed for allergies or rhinitis. 06/07/23   Kandice Orleans, MD  gabapentin  (NEURONTIN ) 100 MG tablet Take 100mg  twice day and 100-300mg  at bedtime Patient taking differently: Take 100-300 mg by mouth 2 (two) times daily. Take 100 mg AM and  300 mg at bedtime 06/12/23   Glory Larsen, MD  Galcanezumab -gnlm (EMGALITY ) 120 MG/ML SOAJ Inject 120 mg into the skin every 30 (thirty) days. Patient taking differently: Inject 120 mg into the skin every 30 (thirty) days. 08/27/22   Glory Larsen, MD  hydrocortisone 2.5 % cream Apply 1 Application topically 2 (two) times daily as needed. 04/16/23   [provider]  ibuprofen  (ADVIL ) 800 MG tablet TAKE 1 TABLET(800 MG) BY MOUTH THREE TIMES DAILY AS NEEDED Patient taking differently: Take 800 mg by mouth 3 (three) times daily as needed. 12/14/21   Glory Larsen, MD  ibuprofen  (ADVIL ) 800 MG tablet Take 1 tablet (800 mg total) by mouth every 8 (eight) hours as needed. 07/10/23   Davies, Melissa, DO  ivabradine  (CORLANOR) 5 MG TABS tablet TAKE 1 AND 1/2 TABLETS BY MOUTH TWICE DAILY WITH MEALS Patient taking differently: Take 7.5 mg by mouth 2 (two) times daily with a meal. 06/28/23   Verona Goodwill, MD  ketorolac  (TORADOL ) 60 MG/2ML SOLN injection Inject 1-29ml (30-60mg ) intramuscularly at onset of migraine. May repeat in 6 hours. Max twice a day and 4 days per month.  Patient taking differently: Inject 30-60 mg/kg into the muscle as directed. Inject 1-2ml (30-60mg ) intramuscularly at onset of migraine. May repeat in 6 hours. Max twice a day and 4 days per month. 05/13/23   Glory Larsen, MD  MAGNESIUM PO Take 500 mg by mouth at bedtime.    [provider]  meclizine  (ANTIVERT ) 25 MG tablet TAKE 1 TABLET(25 MG) BY MOUTH THREE TIMES DAILY AS NEEDED FOR DIZZINESS Patient taking differently: Take 25 mg by mouth 3 (three) times daily as needed for dizziness. 12/13/22   Glory Larsen, MD  midodrine  (PROAMATINE ) 5 MG  tablet Take 1.5 tablets (7.5 mg total) by mouth 3 (three) times daily with meals. Patient taking differently: Take 7.5 mg by mouth 3 (three) times daily with meals. When wake up/  lunch/  dinner 05/15/23   Verona Goodwill, MD  Multiple Vitamins-Minerals (MULTIVITAMIN GUMMIES WOMENS PO) Take 2 each by mouth daily.    [provider]  ondansetron  (ZOFRAN -ODT) 4 MG disintegrating tablet Take 1 tablet (4 mg total) by mouth every 8 (eight) hours as needed for nausea or vomiting (may take with benadryl  if you have itching). 02/25/23   Sofia, Leslie K, PA-C  OVER THE COUNTER MEDICATION Take 4 each by mouth at bedtime. CBD gummies w/  THC  for sleep    [provider]  oxyCODONE  (OXY IR/ROXICODONE ) 5 MG immediate release tablet Take 1 tablet (5 mg total) by mouth every 6 (six) hours as needed for severe pain (pain score 7-10) or breakthrough pain. 07/16/23   Mandy Second, PA-C  propranolol  (INDERAL ) 20 MG tablet Take 1 tablet (20 mg total) by mouth in the morning and at bedtime. 07/05/23   Debria Fang, PA-C  Rimegepant Sulfate  (NURTEC) 75 MG TBDP Take 1 tablet (75 mg total) by mouth daily as needed (Migraines). Patient taking differently: Take 75 mg by mouth daily as needed (Migraines). 01/14/23   Glory Larsen, MD  rizatriptan  (MAXALT -MLT) 10 MG disintegrating tablet Take 1 tablet (10 mg total) by mouth as needed for migraine. May repeat in 2 hours if needed. Do not exceed 2 tablets in 24 hours. Do not take more than 2-3 times per week. Patient taking differently: Take 10 mg by mouth as directed. May repeat in 2 hours if needed. Do not exceed 2 tablets in 24 hours. Do not take more than 2-3 times per week. 04/30/22   Glory Larsen, MD  SPIRIVA  RESPIMAT 1.25 MCG/ACT AERS Inhale 2 puffs into the lungs daily as needed. 03/21/23   [provider]  SYMBICORT  160-4.5 MCG/ACT inhaler Inhale 2 puffs into the lungs 2 (two) times daily as needed. 02/17/23   [provider]   SYRINGE-NEEDLE, DISP, 3 ML (B-D 3CC LUER-LOK SYR 25GX1") 25G X 1" 3 ML MISC Use with ketorolac  05/13/23   Glory Larsen, MD  triamcinolone  ointment (KENALOG ) 0.1 % Apply twice daily for flare ups below neck, maximum 10 days. Patient taking differently: Apply 1 Application topically 2 (two) times daily as needed. Apply twice daily for flare ups below neck, maximum 10 days. 05/31/23   Kandice Orleans, MD  venlafaxine  XR (EFFEXOR -XR) 150 MG 24 hr capsule Take 1 capsule (150 mg total) by mouth daily with breakfast. Take with 75 mg for total of 225 mg daily. Patient taking differently: Take 150 mg by mouth daily with breakfast. Takes with 75 mg for total of 225 mg daily. 02/11/23   Glory Larsen, MD  venlafaxine  XR (EFFEXOR -XR)  75 MG 24 hr capsule Take 1 capsule (75 mg total) by mouth daily with breakfast. Take with 150 mg for total of 225 mg daily. Patient taking differently: Take 75 mg by mouth daily with breakfast. Takes with 150 mg for total of 225 mg daily. 02/11/23   Glory Larsen, MD  zolpidem  (AMBIEN ) 10 MG tablet Take 10 mg by mouth at bedtime.    [provider]      Allergies    Ciprofloxacin, Codeine, Meperidine  hcl, Ondansetron , Prednisone , Septra [sulfamethoxazole-trimethoprim], and Phenergan  [promethazine ]    Review of Systems   Review of Systems  Gastrointestinal:  Positive for abdominal pain.    Physical Exam Updated Vital Signs BP (!) 143/89 (BP Location: Right Arm)   Pulse 68   Temp 98.3 F (36.8 C) (Oral)   Resp 16   LMP 08/26/2016   SpO2 100%  Physical Exam Constitutional:      Appearance: She is well-developed.  HENT:     Head: Normocephalic.  Cardiovascular:     Rate and Rhythm: Normal rate.  Pulmonary:     Effort: Pulmonary effort is normal.  Abdominal:     General: Bowel sounds are normal.     Palpations: Abdomen is soft.     Tenderness: There is abdominal tenderness. There is guarding.     Comments: Laparoscopic incisions unremarkable,  without dehiscence, drainage or surrounding redness. The abdomen is diffusely tender with guarding.   Musculoskeletal:        General: Normal range of motion.     Cervical back: Normal range of motion and neck supple.  Skin:    General: Skin is warm and dry.  Neurological:     General: No focal deficit present.     Mental Status: She is alert and oriented to person, place, and time.     ED Results / Procedures / Treatments   Labs (all labs ordered are listed, but only abnormal results are displayed) Labs Reviewed  COMPREHENSIVE METABOLIC PANEL WITH GFR - Abnormal; Notable for the following components:      Result Value   AST 74 (*)    ALT 107 (*)    All other components within normal limits  URINALYSIS, ROUTINE W REFLEX MICROSCOPIC - Abnormal; Notable for the following components:   Color, Urine COLORLESS (*)    Bacteria, UA RARE (*)    All other components within normal limits  CBC WITH DIFFERENTIAL/PLATELET  LIPASE, BLOOD   Results for orders placed or performed during the hospital encounter of 07/16/23  CBC with Differential   Collection Time: 07/16/23  9:21 PM  Result Value Ref Range   WBC 8.4 4.0 - 10.5 K/uL   RBC 4.37 3.87 - 5.11 MIL/uL   Hemoglobin 13.4 12.0 - 15.0 g/dL   HCT 09.8 11.9 - 14.7 %   MCV 91.1 80.0 - 100.0 fL   MCH 30.7 26.0 - 34.0 pg   MCHC 33.7 30.0 - 36.0 g/dL   RDW 82.9 56.2 - 13.0 %   Platelets 235 150 - 400 K/uL   nRBC 0.0 0.0 - 0.2 %   Neutrophils Relative % 67 %   Neutro Abs 5.7 1.7 - 7.7 K/uL   Lymphocytes Relative 20 %   Lymphs Abs 1.7 0.7 - 4.0 K/uL   Monocytes Relative 6 %   Monocytes Absolute 0.5 0.1 - 1.0 K/uL   Eosinophils Relative 5 %   Eosinophils Absolute 0.4 0.0 - 0.5 K/uL   Basophils Relative 1 %  Basophils Absolute 0.1 0.0 - 0.1 K/uL   Immature Granulocytes 1 %   Abs Immature Granulocytes 0.04 0.00 - 0.07 K/uL  Lipase, blood   Collection Time: 07/16/23  9:21 PM  Result Value Ref Range   Lipase 28 11 - 51 U/L   Comprehensive metabolic panel   Collection Time: 07/16/23  9:21 PM  Result Value Ref Range   Sodium 138 135 - 145 mmol/L   Potassium 4.0 3.5 - 5.1 mmol/L   Chloride 101 98 - 111 mmol/L   CO2 24 22 - 32 mmol/L   Glucose, Bld 89 70 - 99 mg/dL   BUN 12 6 - 20 mg/dL   Creatinine, Ser 1.32 0.44 - 1.00 mg/dL   Calcium 44.0 8.9 - 10.2 mg/dL   Total Protein 7.3 6.5 - 8.1 g/dL   Albumin 4.3 3.5 - 5.0 g/dL   AST 74 (H) 15 - 41 U/L   ALT 107 (H) 0 - 44 U/L   Alkaline Phosphatase 69 38 - 126 U/L   Total Bilirubin 0.2 0.0 - 1.2 mg/dL   GFR, Estimated >72 >53 mL/min   Anion gap 13 5 - 15  Urinalysis, Routine w reflex microscopic -Urine, Clean Catch   Collection Time: 07/16/23  9:21 PM  Result Value Ref Range   Color, Urine COLORLESS (A) YELLOW   APPearance CLEAR CLEAR   Specific Gravity, Urine 1.005 1.005 - 1.030   pH 5.5 5.0 - 8.0   Glucose, UA NEGATIVE NEGATIVE mg/dL   Hgb urine dipstick NEGATIVE NEGATIVE   Bilirubin Urine NEGATIVE NEGATIVE   Ketones, ur NEGATIVE NEGATIVE mg/dL   Protein, ur NEGATIVE NEGATIVE mg/dL   Nitrite NEGATIVE NEGATIVE   Leukocytes,Ua NEGATIVE NEGATIVE   RBC / HPF 0-5 0 - 5 RBC/hpf   WBC, UA 0-5 0 - 5 WBC/hpf   Bacteria, UA RARE (A) NONE SEEN   Squamous Epithelial / HPF 0-5 0 - 5 /HPF     EKG None  Radiology CT ABDOMEN PELVIS W CONTRAST Result Date: 07/16/2023 CLINICAL DATA:  Abdominal pain, post-op, right flank pain, 3 days status post hysterectomy EXAM: CT ABDOMEN AND PELVIS WITH CONTRAST TECHNIQUE: Multidetector CT imaging of the abdomen and pelvis was performed using the standard protocol following bolus administration of intravenous contrast. RADIATION DOSE REDUCTION: This exam was performed according to the departmental dose-optimization program which includes automated exposure control, adjustment of the mA and/or kV according to patient size and/or use of iterative reconstruction technique. CONTRAST:  OMNIPAQUE  IOHEXOL  300 MG/ML  SOLN  COMPARISON:  02/25/2023 FINDINGS: Lower chest: No acute abnormality. Hepatobiliary: No focal liver abnormality is seen. No gallstones, gallbladder wall thickening, or biliary dilatation. Pancreas: Unremarkable Spleen: Unremarkable Adrenals/Urinary Tract: Adrenal glands are unremarkable. Kidneys are normal, without renal calculi, focal lesion, or hydronephrosis. Bladder is unremarkable. Stomach/Bowel: Surgical changes of ileocecectomy are identified. The stomach, small bowel, and large bowel are otherwise unremarkable. No evidence of obstruction or focal inflammation. Punctate foci of free intraperitoneal gas or likely postsurgical in nature. No free or loculated intra-abdominal fluid collections. Vascular/Lymphatic: No significant vascular findings are present. No enlarged abdominal or pelvic lymph nodes. Reproductive: Surgical changes of hysterectomy are identified. Mild infiltrative changes no deep pelvis are likely postsurgical in nature. As noted above, no loculated intra-abdominal fluid collections are seen. 3.3 cm cystic lesion within the right a appears new from prior examination and may simply represent a follicular cyst in a patient of this age. No follow-up imaging is recommended. Other: No abdominal wall hernia  Musculoskeletal: No acute or significant osseous findings. IMPRESSION: 1. Surgical changes of ileocecectomy and hysterectomy. No apparent surgical complication. 2. Punctate foci of free intraperitoneal gas, likely postsurgical in nature. 3. 3.3 cm cystic lesion within the right adnexum, new from prior examination, and most likely representing a follicular cyst in a patient of this age. Electronically Signed   By: Worthy Heads M.D.   On: 07/16/2023 22:31    Procedures Procedures    Medications Ordered in ED Medications  morphine  (PF) 4 MG/ML injection 4 mg (4 mg Intravenous Given 07/16/23 2129)  iohexol  (OMNIPAQUE ) 300 MG/ML solution 100 mL (100 mLs Intravenous Contrast Given 07/16/23  2212)    ED Course/ Medical Decision Making/ A&P                                 Medical Decision Making This patient presents to the ED for concern of post-op abdominal pain, this involves an extensive number of treatment options, and is a complaint that carries with it a high risk of complications and morbidity.  The differential diagnosis includes abscess, visceral perforation, bleeding   Co morbidities that complicate the patient evaluation  Recent hysterectomy   Additional history obtained:  Additional history and/or information obtained from chart review, notable for Review op note 5/14   Lab Tests:  I Ordered, and personally interpreted labs.  The pertinent results include:   Cmet: negative with the exception of AST 74, ALT 107  CBC: no leukocytosis, normal hgb, normal plts UA - negative for infection   Imaging Studies ordered:  I ordered imaging studies including CT abd/pel Per radiologist interpretation:  IMPRESSION: 1. Surgical changes of ileocecectomy and hysterectomy. No apparent surgical complication. 2. Punctate foci of free intraperitoneal gas, likely postsurgical in nature. 3. 3.3 cm cystic lesion within the right adnexum, new from prior examination, and most likely representing a follicular cyst in a patient of this age.    Cardiac Monitoring:  The patient was maintained on a cardiac monitor.  I personally viewed and interpreted the cardiac monitored which showed an underlying rhythm of: na/   Medicines ordered and prescription drug management:  I ordered medication including morphine   for pain Reevaluation of the patient after these medicines showed that the patient improved I have reviewed the patients home medicines and have made adjustments as needed   Test Considered:  N/a   Critical Interventions:  N/a   Consultations Obtained:  I requested consultation with the OB/GYN, Dr. Mills Alma,  and discussed lab and imaging findings as  well as pertinent plan - they recommend: ok to discharge home, keep scheduled appointment with Dr. Adrain Hopes on Thursday, in 2 days   Problem List / ED Course:  Severe right flank and right abdominal pain progressive over 3 days. No fever, nausea, vomiting. She is moving her bowel without bloody stools. No urinary symptoms. S/p 6 days from laparoscopic hysterectomy right salpingectomy    Reevaluation:  After the interventions noted above, I reevaluated the patient and found that they have :improved   Social Determinants of Health:  Former smoker   Disposition:  After consideration of the diagnostic results and the patients response to treatment, I feel that the patient would benefit from discharge home. .   Amount and/or Complexity of Data Reviewed Labs: ordered. Radiology: ordered.  Risk Prescription drug management.           Final Clinical Impression(s) / ED Diagnoses Final  diagnoses:  Post-op pain    Rx / DC Orders ED Discharge Orders          Ordered    oxyCODONE  (OXY IR/ROXICODONE ) 5 MG immediate release tablet  Every 6 hours PRN        07/16/23 2303              Mandy Second, PA-C 07/16/23 2306    Lowery Rue, DO 07/16/23 2314

## 2023-07-16 NOTE — ED Notes (Signed)
 Patient transported to CT

## 2023-07-16 NOTE — Discharge Instructions (Signed)
 Keep your scheduled appointment with Dr. Adrain Hopes for this Thursday, in 2 days. You can take oxycodone  for pain but should take a stool softener to avoid constipation.   Return to the ED with any new or concerning symptoms.

## 2023-07-16 NOTE — ED Triage Notes (Signed)
 Right side flank abdo pain. Started 3 days ago  Hx hysterectomy 07/10/2023 Seen at [facility].  +murphy sign

## 2023-07-24 ENCOUNTER — Encounter

## 2023-07-24 ENCOUNTER — Other Ambulatory Visit

## 2023-07-30 ENCOUNTER — Ambulatory Visit (INDEPENDENT_AMBULATORY_CARE_PROVIDER_SITE_OTHER): Admitting: Adult Health

## 2023-07-30 DIAGNOSIS — G43709 Chronic migraine without aura, not intractable, without status migrainosus: Secondary | ICD-10-CM | POA: Diagnosis not present

## 2023-07-30 MED ORDER — ONABOTULINUMTOXINA 200 UNITS IJ SOLR
155.0000 [IU] | Freq: Once | INTRAMUSCULAR | Status: AC
  Administered 2023-07-30: 165 [IU] via INTRAMUSCULAR

## 2023-07-30 NOTE — Progress Notes (Signed)
 Botox - 200 units x 1 vial Lot: [id number] Expiration: 12/25/2025 NDC: 2130-8657-84  Bacteriostatic 0.9% Sodium Chloride - 4 mL  Lot: [id number] Expiration: 08/25/2024 NDC: 2841-3244-01  Dx: U27.253 S/P  Witnessed by Delray Fielding, CMA

## 2023-07-30 NOTE — Progress Notes (Signed)
 Update 07/30/2023 JM: Patient returns for Botox  injection.  Prior injection 05/06/2023 with Dr. Tresia Fruit. Reports continued benefit with botox , notes >50% migraine reduction. Can have some wearing off 1-2 weeks prior to next injection. Does have jaw pain which can trigger migraine headaches, masseter injections have helped minimize jaw pain.  Tolerated procedure well today.  Return in 3 months for repeat injection.         Consent Form Botulism Toxin Injection For Chronic Migraine    Reviewed orally with patient, additionally signature is on file:  Botulism toxin has been approved by the Federal drug administration for treatment of chronic migraine. Botulism toxin does not cure chronic migraine and it may not be effective in some patients.  The administration of botulism toxin is accomplished by injecting a small amount of toxin into the muscles of the neck and head. Dosage must be titrated for each individual. Any benefits resulting from botulism toxin tend to wear off after 3 months with a repeat injection required if benefit is to be maintained. Injections are usually done every 3-4 months with maximum effect peak achieved by about 2 or 3 weeks. Botulism toxin is expensive and you should be sure of what costs you will incur resulting from the injection.  The side effects of botulism toxin use for chronic migraine may include:   -Transient, and usually mild, facial weakness with facial injections  -Transient, and usually mild, head or neck weakness with head/neck injections  -Reduction or loss of forehead facial animation due to forehead muscle weakness  -Eyelid drooping  -Dry eye  -Pain at the site of injection or bruising at the site of injection  -Double vision  -Potential unknown long term risks   Contraindications: You should not have Botox  if you are pregnant, nursing, allergic to albumin, have an infection, skin condition, or muscle weakness at the site of the injection, or  have myasthenia gravis, Lambert-Eaton syndrome, or ALS.  It is also possible that as with any injection, there may be an allergic reaction or no effect from the medication. Reduced effectiveness after repeated injections is sometimes seen and rarely infection at the injection site may occur. All care will be taken to prevent these side effects. If therapy is given over a long time, atrophy and wasting in the muscle injected may occur. Occasionally the patient's become refractory to treatment because they develop antibodies to the toxin. In this event, therapy needs to be modified.  I have read the above information and consent to the administration of botulism toxin.    BOTOX  PROCEDURE NOTE FOR MIGRAINE HEADACHE  Contraindications and precautions discussed with patient(above). Aseptic procedure was observed and patient tolerated procedure. Procedure performed by Johny Nap, AGNP-BC.   The condition has existed for more than 6 months, and pt does not have a diagnosis of ALS, Myasthenia Gravis or Lambert-Eaton Syndrome.  Risks and benefits of injections discussed and pt agrees to proceed with the procedure.  Written consent obtained  These injections are medically necessary. Pt  receives good benefits from these injections. These injections do not cause sedations or hallucinations which the oral therapies may cause.   Description of procedure:  The patient was placed in a sitting position. The standard protocol was used for Botox  as follows, with 5 units of Botox  injected at each site:  -Procerus muscle, midline injection  -Corrugator muscle, bilateral injection  -Frontalis muscle, bilateral injection, with 2 sites each side, medial injection was performed in the upper one  third of the frontalis muscle, in the region vertical from the medial inferior edge of the superior orbital rim. The lateral injection was again in the upper one third of the forehead vertically above the lateral limbus  of the cornea, 1.5 cm lateral to the medial injection site.  -Temporalis muscle injection, 4 sites, bilaterally. The first injection was 3 cm above the tragus of the ear, second injection site was 1.5 cm to 3 cm up from the first injection site in line with the tragus of the ear. The third injection site was 1.5-3 cm forward between the first 2 injection sites. The fourth injection site was 1.5 cm posterior to the second injection site. 5th site laterally in the temporalis  muscleat the level of the outer canthus.  -Occipitalis muscle injection, 3 sites, bilaterally. The first injection was done one half way between the occipital protuberance and the tip of the mastoid process behind the ear. The second injection site was done lateral and superior to the first, 1 fingerbreadth from the first injection. The third injection site was 1 fingerbreadth superiorly and medially from the first injection site.  -Cervical paraspinal muscle injection, 2 sites, bilaterally. The first injection site was 1 cm from the midline of the cervical spine, 3 cm inferior to the lower border of the occipital protuberance. The second injection site was 1.5 cm superiorly and laterally to the first injection site.  -Trapezius muscle injection was performed at 3 sites, bilaterally. The first injection site was in the upper trapezius muscle halfway between the inflection point of the neck, and the acromion. The second injection site was one half way between the acromion and the first injection site. The third injection was done between the first injection site and the inflection point of the neck.  -Masseter muscle injection, 1 side bilaterally    A total of 200 units of Botox  was prepared, 165 units of Botox  was injected as documented above, any Botox  not injected was wasted. The patient tolerated the procedure well, there were no complications of the above procedure.   Johny Nap, AGNP-BC  [facility] [address] [place], [place] [address]  Phone [phone] Fax [phone] Note: This document was prepared with digital dictation and possible smart phrase technology. Any transcriptional errors that result from this process are unintentional.

## 2023-07-31 ENCOUNTER — Other Ambulatory Visit

## 2023-07-31 ENCOUNTER — Encounter

## 2023-08-01 ENCOUNTER — Encounter: Payer: Self-pay | Admitting: Adult Health

## 2023-08-03 ENCOUNTER — Ambulatory Visit
Admission: RE | Admit: 2023-08-03 | Discharge: 2023-08-03 | Disposition: A | Discharge status: Home / Self Care | Source: Ambulatory Visit | Attending: Neurology

## 2023-08-03 ENCOUNTER — Ambulatory Visit
Admission: RE | Admit: 2023-08-03 | Discharge: 2023-08-03 | Disposition: A | Discharge status: Home / Self Care | Source: Ambulatory Visit | Attending: Neurology | Admitting: Neurology

## 2023-08-03 DIAGNOSIS — G479 Sleep disorder, unspecified: Secondary | ICD-10-CM

## 2023-08-03 DIAGNOSIS — R479 Unspecified speech disturbances: Secondary | ICD-10-CM

## 2023-08-03 DIAGNOSIS — R519 Headache, unspecified: Secondary | ICD-10-CM

## 2023-08-03 DIAGNOSIS — R42 Dizziness and giddiness: Secondary | ICD-10-CM

## 2023-08-03 DIAGNOSIS — R41840 Attention and concentration deficit: Secondary | ICD-10-CM

## 2023-08-03 DIAGNOSIS — R2990 Unspecified symptoms and signs involving the nervous system: Secondary | ICD-10-CM | POA: Diagnosis not present

## 2023-08-03 DIAGNOSIS — R4189 Other symptoms and signs involving cognitive functions and awareness: Secondary | ICD-10-CM

## 2023-08-03 DIAGNOSIS — R202 Paresthesia of skin: Secondary | ICD-10-CM

## 2023-08-03 DIAGNOSIS — G500 Trigeminal neuralgia: Secondary | ICD-10-CM

## 2023-08-03 DIAGNOSIS — H538 Other visual disturbances: Secondary | ICD-10-CM

## 2023-08-03 DIAGNOSIS — G379 Demyelinating disease of central nervous system, unspecified: Secondary | ICD-10-CM

## 2023-08-03 DIAGNOSIS — R5383 Other fatigue: Secondary | ICD-10-CM

## 2023-08-03 DIAGNOSIS — R200 Anesthesia of skin: Secondary | ICD-10-CM

## 2023-08-03 DIAGNOSIS — R208 Other disturbances of skin sensation: Secondary | ICD-10-CM

## 2023-08-03 DIAGNOSIS — R251 Tremor, unspecified: Secondary | ICD-10-CM

## 2023-08-03 DIAGNOSIS — F419 Anxiety disorder, unspecified: Secondary | ICD-10-CM

## 2023-08-03 DIAGNOSIS — G8929 Other chronic pain: Secondary | ICD-10-CM

## 2023-08-03 DIAGNOSIS — R9082 White matter disease, unspecified: Secondary | ICD-10-CM

## 2023-08-03 MED ORDER — GADOPICLENOL 0.5 MMOL/ML IV SOLN
10.0000 mL | Freq: Once | INTRAVENOUS | Status: AC | PRN
  Administered 2023-08-03: 9 mL via INTRAVENOUS

## 2023-08-05 ENCOUNTER — Ambulatory Visit: Payer: Self-pay | Admitting: Neurology

## 2023-08-14 ENCOUNTER — Other Ambulatory Visit: Payer: Self-pay | Admitting: Neurology

## 2023-08-14 DIAGNOSIS — G43009 Migraine without aura, not intractable, without status migrainosus: Secondary | ICD-10-CM

## 2023-08-14 DIAGNOSIS — G43511 Persistent migraine aura without cerebral infarction, intractable, with status migrainosus: Secondary | ICD-10-CM

## 2023-08-14 DIAGNOSIS — G43709 Chronic migraine without aura, not intractable, without status migrainosus: Secondary | ICD-10-CM

## 2023-08-17 ENCOUNTER — Other Ambulatory Visit: Payer: Self-pay | Admitting: Internal Medicine

## 2023-08-18 DIAGNOSIS — G90A Postural orthostatic tachycardia syndrome (POTS): Secondary | ICD-10-CM | POA: Diagnosis not present

## 2023-08-18 DIAGNOSIS — R0609 Other forms of dyspnea: Secondary | ICD-10-CM | POA: Diagnosis not present

## 2023-08-18 DIAGNOSIS — R42 Dizziness and giddiness: Secondary | ICD-10-CM

## 2023-08-19 ENCOUNTER — Ambulatory Visit: Payer: Self-pay | Admitting: Cardiology

## 2023-08-19 NOTE — Telephone Encounter (Signed)
 Left message to call back

## 2023-08-19 NOTE — Telephone Encounter (Signed)
-----   Message from Rollo FABIENE Louder sent at 08/19/2023  2:34 PM EDT ----- Please tell patient that her cardiac monitor showed sinus rhythm with episodes of both fast and slow HR. There was only one short episode of SVT lasting 4 beats. No pauses. No sustained arrhythmias.  Overall fairly normal monitor, this is good news! Thanks KJ  ----- Message ----- From: Waddell Danelle ORN, MD Sent: 08/18/2023   3:40 PM EDT To: Rollo JONELLE Louder, PA-C

## 2023-08-20 ENCOUNTER — Telehealth: Payer: Self-pay | Admitting: Cardiology

## 2023-08-20 NOTE — Telephone Encounter (Signed)
 Rollo JONELLE Louder, PA-C 08/19/2023  2:34 PM EDT     Please tell patient that her cardiac monitor showed sinus rhythm with episodes of both fast and slow HR. There was only one short episode of SVT lasting 4 beats. No pauses. No sustained arrhythmias. Overall fairly normal monitor, this is good news! Thanks KJ    Called patient back about monitor results. Patient stated she has been taking her propranolol  like prescribed. Patient stated when she was wearing the monitor she was recovering from her hysterectomy and she was not doing much at the time. Patient stated now that she is moving around more and she keeps having episodes of elevated HR of 125. Patient stated at resting her HR is 60's-70's. Patient is concerned about the SVT, since she has had an ablation in the past. Patient had an appointment with Dr. Fernande next month, but it looks like it needs to be rescheduled. Will send message to Rollo Louder PA for advisement and Dr. Celine scheduler.

## 2023-08-20 NOTE — Telephone Encounter (Signed)
 Patient returning logan's call in regards to heart monitor results

## 2023-08-21 NOTE — Telephone Encounter (Signed)
-----   Message from Rollo FABIENE Louder sent at 08/19/2023  2:34 PM EDT ----- Please tell patient that her cardiac monitor showed sinus rhythm with episodes of both fast and slow HR. There was only one short episode of SVT lasting 4 beats. No pauses. No sustained arrhythmias.  Overall fairly normal monitor, this is good news! Thanks KJ  ----- Message ----- From: Waddell Danelle ORN, MD Sent: 08/18/2023   3:40 PM EDT To: Rollo JONELLE Louder, PA-C

## 2023-08-21 NOTE — Telephone Encounter (Signed)
 Left message to call back

## 2023-08-22 NOTE — Telephone Encounter (Signed)
 Patient called back advised of below they verbalized understanding

## 2023-08-28 ENCOUNTER — Ambulatory Visit
Admission: RE | Admit: 2023-08-28 | Discharge: 2023-08-28 | Disposition: A | Discharge status: Home / Self Care | Source: Ambulatory Visit | Attending: Obstetrics and Gynecology | Admitting: Obstetrics and Gynecology

## 2023-08-28 ENCOUNTER — Ambulatory Visit

## 2023-08-28 DIAGNOSIS — M79622 Pain in left upper arm: Secondary | ICD-10-CM

## 2023-09-03 ENCOUNTER — Telehealth: Payer: Self-pay | Admitting: Adult Health

## 2023-09-03 ENCOUNTER — Telehealth: Payer: Self-pay | Admitting: Neurology

## 2023-09-03 NOTE — Telephone Encounter (Signed)
 Mia Rubio called  to get pt Botox  Delivered  Botox  will be here 7 days before Pt at appt  Pt appt  8-27  Botox  delivery date will be  October 15, 2023   Botox  Injection   Quantity of 1 valve  for 30 day supply 200 units injection

## 2023-09-03 NOTE — Telephone Encounter (Signed)
Updated appt note

## 2023-09-04 ENCOUNTER — Encounter: Payer: Self-pay | Admitting: Internal Medicine

## 2023-09-09 NOTE — Progress Notes (Signed)
 Cardiology Office Note   Date:  09/20/2023  ID:  Mia Rubio, DOB [birth year], MRN [id number] PCP: Frederik Charleston, MD  [facility] Providers Cardiologist:  Elspeth Sage, MD   History of Present Illness Mia Rubio is a 43 y.o. female with a past medical history of POTS, SVT, multiple neurologic and somatic symptoms, panic disorder, migraines. Per chart review, appears that patient has multiple neruological symptoms and is followed by a neurologist. She is followed by Dr. Sage and presents today for evaluation of elevated HR.    Per chart review, patient previously underwent SVT ablation in 07/2021. She had recurrent SVT that was treated with atenolol . She underwent echocardiogram 03/08/22 that showed EF 60-65%, no regional wall motion abnormalities, normal RV systolic function, no significant valvular abnormalities. She wore a cardiac monitor in 03/2022 that showed no significant arrhythmias. Triggered events correlated with sinus rhythm with HR 73-135 BPM.    Patient was last seen by Dr. Sage on 05/15/23. At that time, patient had a variety of complaints. These included zapping in her eyes, migraines. She was off beta-blockers. Dr. Sage recommended resuming, but patient did not want to make any medication changes.    Patient contacted the office 5/8 complaining of elevated HR. Reportedly had HR around 100 at rest. Had gotten up to 140 when going to the bathroom. An appointment was arranged for further evaluation.  I saw her in clinic 07/05/2023.  Attempted to manage symptoms with propranolol  20 mg twice daily.  With her history of SVT, we repeated a cardiac monitor that showed normal sinus rhythm with both sinus bradycardia to 44 bpm and sinus tachycardia up to 132 bpm.  There was 1 episode of SVT that only lasted 4 beats.  No VT, A-fib, or sustained SVT noted.  Patient's symptoms were associated with normal sinus rhythm  Today, patient presents for follow-up appointment.  Reports  that since starting propranolol , her tachycardia seems to be a bit better controlled.  She has been having issues with fatigue.  Reports feeling exhausted all the time, sleep for long periods of time.  She is hyperaware of any palpitations or feelings of elevated heart rate that she has.  Wonders if some of these are normal.  Episodes are usually brief and she cannot catch them on her Apple Watch.  She believes that some of her symptoms are exacerbated by the heat.  She is worried that her SVT might be coming back.  Has questions about the progression of SVT and triggers for SVT/tachycardia.  She does a good job staying hydrated.  Does not drink caffeine.  Admits to being under a lot of stress and her symptoms cause her anxiety. In the past, her symptoms did improve with addition of depression medication. She is not currently followed by a psychiatrist   Studies Reviewed Cardiac Studies & Procedures   ______________________________________________________________________________________________     ECHOCARDIOGRAM  ECHOCARDIOGRAM COMPLETE 03/08/2022  Narrative ECHOCARDIOGRAM REPORT    Patient Name:   Mia Rubio Date of Exam: 03/08/2022 Medical Rec #:  [id number]       Height:       69.0 in Accession #:    [id number]      Weight:       197.4 lb Date of Birth:  [birth year]       BSA:          2.055 m Patient Age:    41 years        BP:  124/68 mmHg Patient Gender: F               HR:           92 bpm. Exam Location:  [address]  Procedure: 2D Echo, 3D Echo, Cardiac Doppler, Color Doppler and Strain Analysis  Indications:    R06.00 Dyspnea  History:        Patient has no prior history of Echocardiogram examinations.  Sonographer:    Carl Coma RDCS Referring Phys: [phone] MATTHEW R HUNSUCKER  IMPRESSIONS   1. Left ventricular ejection fraction, by estimation, is 60 to 65%. Left ventricular ejection fraction by 3D volume is 59 %. The left ventricle has normal  function. The left ventricle has no regional wall motion abnormalities. Left ventricular diastolic parameters were normal. The average left ventricular global longitudinal strain is -25.6 %. The global longitudinal strain is normal. 2. Right ventricular systolic function is normal. The right ventricular size is normal. 3. The mitral valve is normal in structure. Trivial mitral valve regurgitation. No evidence of mitral stenosis. 4. The aortic valve is normal in structure. Aortic valve regurgitation is not visualized. No aortic stenosis is present. 5. The inferior vena cava is normal in size with greater than 50% respiratory variability, suggesting right atrial pressure of 3 mmHg.  FINDINGS Left Ventricle: Left ventricular ejection fraction, by estimation, is 60 to 65%. Left ventricular ejection fraction by 3D volume is 59 %. The left ventricle has normal function. The left ventricle has no regional wall motion abnormalities. The average left ventricular global longitudinal strain is -25.6 %. The global longitudinal strain is normal. The left ventricular internal cavity size was normal in size. There is no left ventricular hypertrophy. Left ventricular diastolic parameters were normal. Normal left ventricular filling pressure.  Right Ventricle: The right ventricular size is normal. No increase in right ventricular wall thickness. Right ventricular systolic function is normal.  Left Atrium: Left atrial size was normal in size.  Right Atrium: Right atrial size was normal in size.  Pericardium: There is no evidence of pericardial effusion.  Mitral Valve: The mitral valve is normal in structure. Trivial mitral valve regurgitation. No evidence of mitral valve stenosis.  Tricuspid Valve: The tricuspid valve is normal in structure. Tricuspid valve regurgitation is trivial. No evidence of tricuspid stenosis.  Aortic Valve: The aortic valve is normal in structure. Aortic valve regurgitation is not  visualized. No aortic stenosis is present.  Pulmonic Valve: The pulmonic valve was normal in structure. Pulmonic valve regurgitation is not visualized. No evidence of pulmonic stenosis.  Aorta: The aortic root is normal in size and structure.  Venous: The inferior vena cava is normal in size with greater than 50% respiratory variability, suggesting right atrial pressure of 3 mmHg.  IAS/Shunts: No atrial level shunt detected by color flow Doppler.   LEFT VENTRICLE PLAX 2D LVIDd:         4.60 cm         Diastology LVIDs:         2.40 cm         LV e' medial:    11.00 cm/s LV PW:         0.70 cm         LV E/e' medial:  8.2 LV IVS:        0.70 cm         LV e' lateral:   14.25 cm/s LVOT diam:     1.90 cm  LV E/e' lateral: 6.3 LV SV:         55 LV SV Index:   27              2D LVOT Area:     2.84 cm        Longitudinal Strain 2D Strain GLS  -25.7 % (A2C): 2D Strain GLS  -26.7 % (A3C): 2D Strain GLS  -24.4 % (A4C): 2D Strain GLS  -25.6 % Avg:  3D Volume EF LV 3D EF:    Left ventricul ar ejection fraction by 3D volume is 59 %.  3D Volume EF: 3D EF:        59 % LV EDV:       135 ml LV ESV:       56 ml LV SV:        80 ml  RIGHT VENTRICLE             IVC RV Basal diam:  2.60 cm     IVC diam: 0.80 cm RV S prime:     15.75 cm/s TAPSE (M-mode): 2.3 cm  LEFT ATRIUM             Index        RIGHT ATRIUM          Index LA diam:        3.00 cm 1.46 cm/m   RA Area:     7.66 cm LA Vol (A2C):   32.2 ml 15.67 ml/m  RA Volume:   14.80 ml 7.20 ml/m LA Vol (A4C):   30.2 ml 14.70 ml/m LA Biplane Vol: 31.2 ml 15.19 ml/m AORTIC VALVE LVOT Vmax:   111.67 cm/s LVOT Vmean:  74.367 cm/s LVOT VTI:    0.192 m  AORTA Ao Root diam: 3.20 cm Ao Asc diam:  2.80 cm  MITRAL VALVE MV Area (PHT): 4.10 cm    SHUNTS MV Decel Time: 185 msec    Systemic VTI:  0.19 m MV E velocity: 89.70 cm/s  Systemic Diam: 1.90 cm MV A velocity: 74.10 cm/s MV E/A ratio:  1.21  Wilbert Bihari MD Electronically signed by Wilbert Bihari MD Signature Date/Time: 03/08/2022/5:19:41 PM    Final    MONITORS  LONG TERM MONITOR (3-14 DAYS) 08/12/2023  Narrative NSR with sinus brady (44/min) and sinus tachy (132/min), ave 73/min. 1 episode of NS SVT (4 beats at 129/min) No VT, atrial fib or sustained SVT. No prolonged pauses. Rare PAC's and PVC's (<1%) Symptoms associated with NSR Patch Wear Time:  5 days and 10 hours (2025-06-02T01:34:39-0400 to 2025-06-07T11:44:26-0400)       ______________________________________________________________________________________________       Risk Assessment/Calculations           Physical Exam VS:  BP 114/74   Pulse 66   Ht 5' 9 (1.753 m)   Wt 210 lb (95.3 kg)   LMP 08/26/2016   SpO2 98%   BMI 31.01 kg/m        Wt Readings from Last 3 Encounters:  09/20/23 210 lb (95.3 kg)  07/10/23 195 lb (88.5 kg)  07/05/23 199 lb 3.2 oz (90.4 kg)    GEN: Well nourished, well developed in no acute distress. Sitting comfortably in the chair  NECK: No JVD  CARDIAC:  RRR, no murmurs, rubs, gallops. Radial pulses 2+ bilaterally  RESPIRATORY:  Clear to auscultation without rales, wheezing or rhonchi. Normal WOB on room air   ABDOMEN: Soft, non-tender, non-distended EXTREMITIES:  No edema in BLE; No  deformity   ASSESSMENT AND PLAN  POTS  SVT s/p Ablation  - Patient previously had SVT ablation in 07/2021. Has been followed by Dr. Fernande since at least 2017 for POTS.  She also has several neurological conditions and follows with neurology. - Was previously on atenolol , but this was stopped due to fatigue  - When I saw her in 06/2023, started propranolol  20 mg BID to attempt to control HR. Reports that this helped with her tachycardia. Did seem to worsen her fatigue. Currently only taking propranolol  20 mg at night. Discussed decreasing to 10 mg for a week or so to see if energy improves  - Continue ivabradine  7.5 mg with BID, midodrine   7.5 mg TID  - Repeat cardiac monitor in 07/2023 showed only 1 run of SVT that lasted 4 beats, sinus rhythm with HR ranging from 44-132 BPM. Symptoms assicated with NSR  - Reassured patient that on her monitor, her SVT burden was very low and brief. She has some anxiety due to her symptoms. Discussed that brief palpitations are not dangerous. Discussed triggers for SVT.  - Symptoms seem to worsen in the heat. Discussed possible repeat monitor if her palpitations persist after summer  - She does a good job staying hydrated, estimates she drinks about 6 propels per day and additional water  at meals. Encouraged use of compression stockings  - Encouraged her to continue recumbent exercises such as exercise bike, rowing, swimming  - Encouraged her to continue to follow with her neurologist for her multiple neurologic symptoms and diagnoses. Also encouraged her to establish care with a psychiatrist as she is on xanax , effexor   - Referred her to POTs specialist at [facility]- she understands that the wait list is quite long     Dispo: Follow up in 3 months   Signed, Rollo FABIENE Louder, PA-C

## 2023-09-19 ENCOUNTER — Other Ambulatory Visit: Payer: Self-pay | Admitting: Neurology

## 2023-09-19 DIAGNOSIS — G43709 Chronic migraine without aura, not intractable, without status migrainosus: Secondary | ICD-10-CM

## 2023-09-20 ENCOUNTER — Ambulatory Visit: Attending: Cardiology | Admitting: Cardiology

## 2023-09-20 ENCOUNTER — Ambulatory Visit: Admitting: Internal Medicine

## 2023-09-20 ENCOUNTER — Encounter: Payer: Self-pay | Admitting: Cardiology

## 2023-09-20 VITALS — BP 114/74 | HR 66 | Ht 69.0 in | Wt 210.0 lb

## 2023-09-20 DIAGNOSIS — G90A Postural orthostatic tachycardia syndrome (POTS): Secondary | ICD-10-CM | POA: Diagnosis present

## 2023-09-20 DIAGNOSIS — I471 Supraventricular tachycardia, unspecified: Secondary | ICD-10-CM | POA: Insufficient documentation

## 2023-09-20 NOTE — Patient Instructions (Signed)
 Medication Instructions:  No changes *If you need a refill on your cardiac medications before your next appointment, please call your pharmacy*  Lab Work: No labs  Testing/Procedures: No testing  Follow-Up: At [facility], you and your health needs are our priority.  As part of our continuing mission to provide you with exceptional heart care, our providers are all part of one team.  This team includes your primary Cardiologist (physician) and Advanced Practice Providers or APPs (Physician Assistants and Nurse Practitioners) who all work together to provide you with the care you need, when you need it.  Your next appointment:   3 month(s)  Provider:   Rollo Louder, PA-C   We recommend signing up for the patient portal called MyChart.  Sign up information is provided on this After Visit Summary.  MyChart is used to connect with patients for Virtual Visits (Telemedicine).  Patients are able to view lab/test results, encounter notes, upcoming appointments, etc.  Non-urgent messages can be sent to your provider as well.   To learn more about what you can do with MyChart, go to [email or web address].

## 2023-09-20 NOTE — Telephone Encounter (Signed)
 Last office visit 06/12/23 Follow up scheduled on 12/24/23

## 2023-09-30 ENCOUNTER — Ambulatory Visit (INDEPENDENT_AMBULATORY_CARE_PROVIDER_SITE_OTHER): Admitting: Podiatry

## 2023-09-30 ENCOUNTER — Encounter: Payer: Self-pay | Admitting: Podiatry

## 2023-09-30 VITALS — Ht 69.0 in | Wt 210.0 lb

## 2023-09-30 DIAGNOSIS — L600 Ingrowing nail: Secondary | ICD-10-CM | POA: Diagnosis not present

## 2023-09-30 NOTE — Patient Instructions (Addendum)

## 2023-10-01 NOTE — Progress Notes (Signed)
 Chief Complaint  Patient presents with   Ingrown Toenail    Pt is here due to bilateral ingrown on 2nd toes.    Subjective: Patient presents today for evaluation of pain to the medial border bilateral second toes. Patient is concerned for possible ingrown nail.  It is very sensitive to touch.  Patient presents today for further treatment and evaluation.  Past Medical History:  Diagnosis Date   Abnormal uterine bleeding (AUB)    Allergic rhinitis    Asthma    cold / exercised induced  (pulm--- dr annella)   Chronic constipation    Dysautonomia orthostatic hypotension syndrome    followed by dr fernande;    s/p  SVT ablation w/ subseqent dysautonomia decompression   Environmental allergies    environmental   Eyes sensitive to light, unspecified laterality    GAD (generalized anxiety disorder)    GERD (gastroesophageal reflux disease)    History of abnormal cervical Pap smear    Insomnia    negative sleep study in epic 07-16-2022   Intermittent palpitations    Intractable chronic migraine with aura    neurologist--- dr ines   Neurological symptoms    followed by dr ines;  multiple symptoms--- fatique/  tremors/  light sensitivity/  diplopia w/ blurry vision and electrical shocks of eyes   Panic disorder 12/19/2017   something that happens when I have a flare regarding POTS   POTS (postural orthostatic tachycardia syndrome)    cardiologist--- dr fernande   S/P radiofrequency ablation operation for arrhythmia 08/22/2021   by dr g. taylor of SVT   SVT (supraventricular tachycardia) (HCC)    Trigeminal neuralgia    Vestibular dizziness    Wears contact lenses     Past Surgical History:  Procedure Laterality Date   BREAST EXCISIONAL BIOPSY Right 2000   CESAREAN SECTION N/A 08/19/2014   Procedure: CESAREAN SECTION;  Surgeon: Delon Prude, DO;  Location: [facility];  Service: Obstetrics;  Laterality: N/A;   DILATION AND EVACUATION N/A 11/24/2016   Procedure: DILATATION AND  EVACUATION;  Surgeon: Manda Fess, MD;  Location: [facility];  Service: Gynecology;  Laterality: N/A;   FOOT SURGERY Bilateral 2013   each done sepaterly but same year  ;   each had planter fasciotomy and bunionectomy;   right foot had hardware retained   HYSTERECTOMY, TOTAL, LAPAROSCOPIC, ROBOT-ASSISTED WITH SALPINGECTOMY Bilateral 07/10/2023   Procedure: HYSTERECTOMY, TOTAL, LAPAROSCOPIC, ROBOT-ASSISTED WITH SALPINGECTOMY;  Surgeon: Storm Setter, DO;  Location: [facility] OR;  Service: Gynecology;  Laterality: Bilateral;   KNEE ARTHROSCOPY Left    left    1999  and  2001   LAPAROSCOPIC ABDOMINAL EXPLORATION  2014   lysis adhesions (scarring from appendectomy)   LAPAROSCOPIC APPENDECTOMY  08/2001   SVT ABLATION N/A 08/22/2021   Procedure: SVT ABLATION;  Surgeon: Waddell Danelle ORN, MD;  Location: [facility];  Service: Cardiovascular;  Laterality: N/A;    Allergies  Allergen Reactions   Ciprofloxacin Itching   Codeine Itching   Meperidine  Hcl Hives   Ondansetron  Other (See Comments)    Other reaction(s): itching - can take with Benadryl    Prednisone  Other (See Comments)    Other reaction(s): extreme dizziness   Septra [Sulfamethoxazole-Trimethoprim] Other (See Comments)    Other reaction(s): blurred vision, itching   Phenergan  [Promethazine ] Hives, Itching and Rash    Objective:  General: Well developed, nourished, in no acute distress, alert and oriented x3   Dermatology: Skin is warm, dry and supple bilateral.  Medial border bilateral second toes is tender with evidence of an ingrowing nail. Pain on palpation noted to the border of the nail fold. The remaining nails appear unremarkable at this time.   Vascular: DP and PT pulses palpable.  No clinical evidence of vascular compromise  Neruologic: Grossly intact via light touch bilateral.  Musculoskeletal: No pedal deformity noted  Assesement: #1 Paronychia with ingrowing nail medial border bilateral second toes  Plan of  Care:  -Patient evaluated.  -Discussed treatment alternatives and plan of care. Explained nail avulsion procedure and post procedure course to patient. -Patient opted for permanent partial nail avulsion of the ingrown portion of the nail.  -Prior to procedure, local anesthesia infiltration utilized using 3 ml of a 50:50 mixture of 2% plain lidocaine  and 0.5% plain marcaine  in a normal hallux block fashion and a betadine prep performed.  -Partial permanent nail avulsion with chemical matrixectomy performed using 3x30sec applications of phenol followed by alcohol flush.  -Light dressing applied.  Post care instructions provided -Return to clinic 3 weeks  Thresa EMERSON Sar, DPM [facility]  Dr. Thresa EMERSON Sar, DPM    [address] [place], [place] [address]                Office [phone]  Fax [phone]

## 2023-10-09 ENCOUNTER — Ambulatory Visit: Admitting: Internal Medicine

## 2023-10-23 ENCOUNTER — Other Ambulatory Visit: Payer: Self-pay | Admitting: Neurology

## 2023-10-23 ENCOUNTER — Ambulatory Visit (INDEPENDENT_AMBULATORY_CARE_PROVIDER_SITE_OTHER): Admitting: Adult Health

## 2023-10-23 ENCOUNTER — Encounter: Payer: Self-pay | Admitting: Neurology

## 2023-10-23 VITALS — BP 116/78 | HR 65

## 2023-10-23 DIAGNOSIS — G43511 Persistent migraine aura without cerebral infarction, intractable, with status migrainosus: Secondary | ICD-10-CM

## 2023-10-23 DIAGNOSIS — G43109 Migraine with aura, not intractable, without status migrainosus: Secondary | ICD-10-CM

## 2023-10-23 DIAGNOSIS — G43709 Chronic migraine without aura, not intractable, without status migrainosus: Secondary | ICD-10-CM

## 2023-10-23 DIAGNOSIS — G43009 Migraine without aura, not intractable, without status migrainosus: Secondary | ICD-10-CM

## 2023-10-23 DIAGNOSIS — G43711 Chronic migraine without aura, intractable, with status migrainosus: Secondary | ICD-10-CM

## 2023-10-23 MED ORDER — KETOROLAC TROMETHAMINE 60 MG/2ML IM SOLN
INTRAMUSCULAR | 0 refills | Status: DC
  Filled 2023-10-23: qty 10, 3d supply, fill #0

## 2023-10-23 MED ORDER — IBUPROFEN 800 MG PO TABS: 800.0000 mg | ORAL_TABLET | Freq: Four times a day (QID) | ORAL | 3 refills | Status: AC | PRN

## 2023-10-23 MED ORDER — NURTEC 75 MG PO TBDP: 75.0000 mg | ORAL_TABLET | Freq: Every day | ORAL | 11 refills | Status: DC | PRN

## 2023-10-23 MED ORDER — ONABOTULINUMTOXINA 200 UNITS IJ SOLR
155.0000 [IU] | Freq: Once | INTRAMUSCULAR | Status: AC
Start: 2023-10-23 — End: 2023-10-23
  Administered 2023-10-23: 165 [IU] via INTRAMUSCULAR

## 2023-10-23 MED ORDER — BD LUER-LOK SYRINGE 25G X 1" 3 ML MISC
0 refills | Status: DC
  Filled 2023-10-23: qty 5, 5d supply, fill #0

## 2023-10-23 NOTE — Progress Notes (Deleted)
 Botox - 200 units x 1 vial Lot: [id number]  Expiration: 12/2025 NDC: 9976-6078-97  Bacteriostatic 0.9% Sodium Chloride - 4 mL  Lot: [id number] Expiration: 12/26/2024 NDC: 959-8033-97  Dx: H56.290 S/P  Witnessed by Catheryn Alexander, RN

## 2023-10-23 NOTE — Progress Notes (Signed)
 Update 10/23/2023 JM: Patient returns for Botox  injection.  Prior injection 07/30/2023. Reports continued benefit with botox , notes >50% migraine reduction. Has had some increased headaches over the past several days, previously experienced increased migraines 1-2 weeks prior to next injection.  Masseter injections continue to help with jaw pain and clenching which can trigger migraine headache.  Continues on Nurtec for rescue. Tolerated procedure well today.  Return in 3 months for repeat injection.         Consent Form Botulism Toxin Injection For Chronic Migraine    Reviewed orally with patient, additionally signature is on file:  Botulism toxin has been approved by the Federal drug administration for treatment of chronic migraine. Botulism toxin does not cure chronic migraine and it may not be effective in some patients.  The administration of botulism toxin is accomplished by injecting a small amount of toxin into the muscles of the neck and head. Dosage must be titrated for each individual. Any benefits resulting from botulism toxin tend to wear off after 3 months with a repeat injection required if benefit is to be maintained. Injections are usually done every 3-4 months with maximum effect peak achieved by about 2 or 3 weeks. Botulism toxin is expensive and you should be sure of what costs you will incur resulting from the injection.  The side effects of botulism toxin use for chronic migraine may include:   -Transient, and usually mild, facial weakness with facial injections  -Transient, and usually mild, head or neck weakness with head/neck injections  -Reduction or loss of forehead facial animation due to forehead muscle weakness  -Eyelid drooping  -Dry eye  -Pain at the site of injection or bruising at the site of injection  -Double vision  -Potential unknown long term risks   Contraindications: You should not have Botox  if you are pregnant, nursing, allergic to  albumin, have an infection, skin condition, or muscle weakness at the site of the injection, or have myasthenia gravis, Lambert-Eaton syndrome, or ALS.  It is also possible that as with any injection, there may be an allergic reaction or no effect from the medication. Reduced effectiveness after repeated injections is sometimes seen and rarely infection at the injection site may occur. All care will be taken to prevent these side effects. If therapy is given over a long time, atrophy and wasting in the muscle injected may occur. Occasionally the patient's become refractory to treatment because they develop antibodies to the toxin. In this event, therapy needs to be modified.  I have read the above information and consent to the administration of botulism toxin.    BOTOX  PROCEDURE NOTE FOR MIGRAINE HEADACHE  Contraindications and precautions discussed with patient(above). Aseptic procedure was observed and patient tolerated procedure. Procedure performed by Harlene Bogaert, AGNP-BC.   The condition has existed for more than 6 months, and pt does not have a diagnosis of ALS, Myasthenia Gravis or Lambert-Eaton Syndrome.  Risks and benefits of injections discussed and pt agrees to proceed with the procedure.  Written consent obtained  These injections are medically necessary. Pt  receives good benefits from these injections. These injections do not cause sedations or hallucinations which the oral therapies may cause.   Description of procedure:  The patient was placed in a sitting position. The standard protocol was used for Botox  as follows, with 5 units of Botox  injected at each site:  -Procerus muscle, midline injection  -Corrugator muscle, bilateral injection  -Frontalis muscle, bilateral injection, with 2  sites each side, medial injection was performed in the upper one third of the frontalis muscle, in the region vertical from the medial inferior edge of the superior orbital rim. The lateral  injection was again in the upper one third of the forehead vertically above the lateral limbus of the cornea, 1.5 cm lateral to the medial injection site.  -Temporalis muscle injection, 4 sites, bilaterally. The first injection was 3 cm above the tragus of the ear, second injection site was 1.5 cm to 3 cm up from the first injection site in line with the tragus of the ear. The third injection site was 1.5-3 cm forward between the first 2 injection sites. The fourth injection site was 1.5 cm posterior to the second injection site. 5th site laterally in the temporalis  muscleat the level of the outer canthus.  -Occipitalis muscle injection, 3 sites, bilaterally. The first injection was done one half way between the occipital protuberance and the tip of the mastoid process behind the ear. The second injection site was done lateral and superior to the first, 1 fingerbreadth from the first injection. The third injection site was 1 fingerbreadth superiorly and medially from the first injection site.  -Cervical paraspinal muscle injection, 2 sites, bilaterally. The first injection site was 1 cm from the midline of the cervical spine, 3 cm inferior to the lower border of the occipital protuberance. The second injection site was 1.5 cm superiorly and laterally to the first injection site.  -Trapezius muscle injection was performed at 3 sites, bilaterally. The first injection site was in the upper trapezius muscle halfway between the inflection point of the neck, and the acromion. The second injection site was one half way between the acromion and the first injection site. The third injection was done between the first injection site and the inflection point of the neck.  -Masseter muscle injection, 1 side bilaterally    A total of 200 units of Botox  was prepared, 165 units of Botox  was injected as documented above, any Botox  not injected was wasted. The patient tolerated the procedure well, there were no  complications of the above procedure.   Harlene Bogaert, AGNP-BC  [facility] [address] [place], [place] [address]  Phone [phone] Fax [phone] Note: This document was prepared with digital dictation and possible smart phrase technology. Any transcriptional errors that result from this process are unintentional.  Botox - 200 units x 1 vial Lot: [id number]  Expiration: 12/2025 NDC: 9976-6078-97  Bacteriostatic 0.9% Sodium Chloride - 4 mL  Lot: [id number] Expiration: 12/26/2024 NDC: [phone]  Dx: H56.290 S/P  Witnessed by Catheryn Alexander, RN

## 2023-10-24 ENCOUNTER — Other Ambulatory Visit (HOSPITAL_COMMUNITY): Payer: Self-pay

## 2023-10-25 ENCOUNTER — Other Ambulatory Visit: Payer: Self-pay | Admitting: *Deleted

## 2023-10-25 DIAGNOSIS — G43009 Migraine without aura, not intractable, without status migrainosus: Secondary | ICD-10-CM

## 2023-10-25 MED ORDER — NURTEC 75 MG PO TBDP: 75.0000 mg | ORAL_TABLET | Freq: Every day | ORAL | 11 refills | Status: AC | PRN

## 2023-10-31 ENCOUNTER — Telehealth: Payer: Self-pay

## 2023-10-31 ENCOUNTER — Other Ambulatory Visit (HOSPITAL_COMMUNITY): Payer: Self-pay

## 2023-10-31 NOTE — Telephone Encounter (Signed)
 Pharmacy Patient Advocate Encounter   Received notification from Fax that prior authorization for Nurtec is required/requested.   Insurance verification completed.   The patient is insured through [facility] MEDICAID .   Per test claim: PA required; PA submitted to above mentioned insurance via Latent Key/confirmation #/EOC [id number] Status is pending

## 2023-11-01 ENCOUNTER — Other Ambulatory Visit: Payer: Self-pay | Admitting: Neurology

## 2023-11-01 ENCOUNTER — Other Ambulatory Visit (HOSPITAL_COMMUNITY): Payer: Self-pay

## 2023-11-01 NOTE — Telephone Encounter (Signed)
 Pharmacy Patient Advocate Encounter  Received notification from [facility] MEDICAID that Prior Authorization for Nurtec has been APPROVED from 10/31/2023 to 10/30/2024. Ran test claim, Copay is $4.00. This test claim was processed through [facility]- copay amounts may vary at other pharmacies due to pharmacy/plan contracts, or as the patient moves through the different stages of their insurance plan.   PA #/Case ID/Reference #: [id number]

## 2023-11-18 ENCOUNTER — Other Ambulatory Visit (HOSPITAL_COMMUNITY): Payer: Self-pay

## 2023-11-18 ENCOUNTER — Telehealth: Payer: Self-pay

## 2023-11-18 NOTE — Telephone Encounter (Signed)
 Pharmacy Patient Advocate Encounter  Received notification from [facility] MEDICAID that Prior Authorization for Emgality  has been APPROVED from 11/18/2023 to 11/17/2024   PA #/Case ID/Reference #: [id number]   XZB:[id number]

## 2023-11-27 ENCOUNTER — Other Ambulatory Visit: Payer: Self-pay | Admitting: Neurology

## 2023-11-27 DIAGNOSIS — G43109 Migraine with aura, not intractable, without status migrainosus: Secondary | ICD-10-CM

## 2023-11-27 DIAGNOSIS — G43711 Chronic migraine without aura, intractable, with status migrainosus: Secondary | ICD-10-CM

## 2023-11-28 ENCOUNTER — Other Ambulatory Visit: Payer: Self-pay | Admitting: Medical Genetics

## 2023-11-28 DIAGNOSIS — Z006 Encounter for examination for normal comparison and control in clinical research program: Secondary | ICD-10-CM

## 2023-12-02 NOTE — Progress Notes (Deleted)
 Cardiology Office Note   Date:  12/02/2023  ID:  YUMIKO ALKINS, DOB 1981-01-14, MRN [id number] PCP: Frederik Charleston, MD  [facility] Providers Cardiologist:  Elspeth Sage, MD   History of Present Illness Mia Rubio is a 43 y.o. female with a past medical history of POTS, SVT, multiple neurologic and somatic symptoms, panic disorder, migraines. Per chart review, appears that patient has multiple neruological symptoms and is followed by a neurologist. She is followed by Dr. Sage and presents today for evaluation of elevated HR.    Per chart review, patient previously underwent SVT ablation in 07/2021. She had recurrent SVT that was treated with atenolol . She underwent echocardiogram 03/08/22 that showed EF 60-65%, no regional wall motion abnormalities, normal RV systolic function, no significant valvular abnormalities. She wore a cardiac monitor in 03/2022 that showed no significant arrhythmias. Triggered events correlated with sinus rhythm with HR 73-135 BPM.    Patient was last seen by Dr. Sage on 05/15/23. At that time, patient had a variety of complaints. These included zapping in her eyes, migraines. She was off beta-blockers. Dr. Sage recommended resuming, but patient did not want to make any medication changes.    Patient contacted the office 5/8 complaining of elevated HR. Reportedly had HR around 100 at rest. Had gotten up to 140 when going to the bathroom. An appointment was arranged for further evaluation.  I saw her in clinic 07/05/2023.  Attempted to manage symptoms with propranolol  20 mg twice daily.  With her history of SVT, we repeated a cardiac monitor that showed normal sinus rhythm with both sinus bradycardia to 44 bpm and sinus tachycardia up to 132 bpm.  There was 1 episode of SVT that only lasted 4 beats.  No VT, A-fib, or sustained SVT noted.  Patient's symptoms were associated with normal sinus rhythm  I saw patient in clinic in 08/2023.  At that time, patient  reported that her tachycardia seems to be a bit better controlled on propranolol .  She did notice having worsening fatigue.  Felt exhausted all the time and was sleeping for long periods of time.  She was very worried that her SVT might be coming back.  Propranolol  was decreased to 10 mg nightly to see if fatigue improved   POTS  SVT s/p Ablation  - Patient previously had SVT ablation in 07/2021. Has been followed by Dr. Sage since at least 2017 for POTS.  She also has several neurological conditions and follows with neurology. - Was previously on atenolol , but this was stopped due to fatigue  - Now on propranolol  10 mg nightly *** - Continue ivabradine  7.5 mg with BID, midodrine  7.5 mg TID  - Repeat cardiac monitor in 07/2023 showed only 1 run of SVT that lasted 4 beats, sinus rhythm with HR ranging from 44-132 BPM. Symptoms assicated with NSR  - Reassured patient that on her monitor, her SVT burden was very low and brief. She has some anxiety due to her symptoms. Discussed that brief palpitations are not dangerous. Discussed triggers for SVT.  - She does a good job staying hydrated, estimates she drinks about 6 propels per day and additional water  at meals. Encouraged use of compression stockings  - Encouraged her to continue recumbent exercises such as exercise bike, rowing, swimming  - Encouraged her to continue to follow with her neurologist for her multiple neurologic symptoms and diagnoses. Also encouraged her to establish care with a psychiatrist as she is on xanax , effexor   -  Referred her to POTs specialist at [facility]- she understands that the wait list is quite long   ROS: ***  Studies Reviewed      *** Risk Assessment/Calculations {Does this patient have ATRIAL FIBRILLATION?:[phone]} No BP recorded.  {Refresh Note OR Click here to enter BP  :1}***       Physical Exam VS:  LMP 08/26/2016        Wt Readings from Last 3 Encounters:  09/30/23 210 lb (95.3 kg)  09/20/23 210 lb  (95.3 kg)  07/10/23 195 lb (88.5 kg)    GEN: Well nourished, well developed in no acute distress NECK: No JVD; No carotid bruits CARDIAC: ***RRR, no murmurs, rubs, gallops RESPIRATORY:  Clear to auscultation without rales, wheezing or rhonchi  ABDOMEN: Soft, non-tender, non-distended EXTREMITIES:  No edema; No deformity   ASSESSMENT AND PLAN ***    {Are you ordering a CV Procedure (e.g. stress test, cath, DCCV, TEE, etc)?   Press F2        :[id number]}  Dispo: ***  Signed, Rollo FABIENE Louder, PA-C

## 2023-12-02 NOTE — Telephone Encounter (Signed)
 Called and LVM with patient to reschedule her appointment with Dr Ines on 10/28.  If patient calls back, she can be rescheduled with Dr Gregg

## 2023-12-03 ENCOUNTER — Other Ambulatory Visit (HOSPITAL_COMMUNITY): Payer: Self-pay

## 2023-12-05 ENCOUNTER — Other Ambulatory Visit: Payer: Self-pay | Admitting: *Deleted

## 2023-12-05 DIAGNOSIS — G43709 Chronic migraine without aura, not intractable, without status migrainosus: Secondary | ICD-10-CM

## 2023-12-05 NOTE — Telephone Encounter (Signed)
 LVM if patient is still using Emgality  for migraines monthly

## 2023-12-09 ENCOUNTER — Telehealth: Payer: Self-pay | Admitting: Neurology

## 2023-12-09 DIAGNOSIS — G43709 Chronic migraine without aura, not intractable, without status migrainosus: Secondary | ICD-10-CM

## 2023-12-09 NOTE — Telephone Encounter (Signed)
 Patient asking if Dr. Gregg will be able to do refill for Galcanezumab -gnlm (EMGALITY ) 120 MG/ML SOAJ before appointment on 07/07/24 11:45 am. Would like a call back.

## 2023-12-10 NOTE — Telephone Encounter (Signed)
 Phone rep called and left a vm asking pt to call office to confirm the pharmacy the Galcanezumab -gnlm (EMGALITY ) 120 MG/ML SOAJ  Needs to be called into

## 2023-12-11 ENCOUNTER — Other Ambulatory Visit: Payer: Self-pay | Admitting: *Deleted

## 2023-12-11 ENCOUNTER — Ambulatory Visit: Admitting: Cardiology

## 2023-12-11 MED ORDER — EMGALITY 120 MG/ML ~~LOC~~ SOAJ: SUBCUTANEOUS | 2 refills | Status: DC

## 2023-12-11 NOTE — Addendum Note (Signed)
 Addended by: NEYSA NENA RAMAN on: 12/11/2023 04:09 PM   Modules accepted: Orders

## 2023-12-11 NOTE — Telephone Encounter (Signed)
 Called pt at [phone]. LVM

## 2023-12-11 NOTE — Telephone Encounter (Signed)
 Spoke to pt.  Relayed that we can refill medications as they come up for renewal.  Some may go to provider to ok.  She has seen Harlene, NP for botox .  [facility] [address] [address].

## 2023-12-11 NOTE — Progress Notes (Signed)
 I called pt.  She needs refills of her emgality  last dispensed 11-29-2023.  [facility] [address].

## 2023-12-17 ENCOUNTER — Other Ambulatory Visit (HOSPITAL_COMMUNITY): Payer: Self-pay

## 2023-12-24 ENCOUNTER — Ambulatory Visit: Admitting: Neurology

## 2023-12-24 ENCOUNTER — Other Ambulatory Visit (HOSPITAL_COMMUNITY): Payer: Self-pay

## 2023-12-24 ENCOUNTER — Telehealth: Payer: Self-pay

## 2023-12-24 ENCOUNTER — Other Ambulatory Visit: Payer: Self-pay

## 2023-12-24 DIAGNOSIS — G43709 Chronic migraine without aura, not intractable, without status migrainosus: Secondary | ICD-10-CM

## 2023-12-24 LAB — GENECONNECT MOLECULAR SCREEN: Genetic Analysis Overall Interpretation: NEGATIVE

## 2023-12-24 NOTE — Telephone Encounter (Signed)
*  [facility]  Pharmacy Patient Advocate Encounter   Received notification from Pt Calls Messages that prior authorization for Emgality  120mg   is required/requested.   Insurance verification completed.   The patient is insured through [facility] MEDICAID.   Per test claim: PA required; PA submitted to above mentioned insurance via [facility] Tracks Key/confirmation #/EOC [id number] W Status is pending

## 2023-12-25 ENCOUNTER — Other Ambulatory Visit: Payer: Self-pay

## 2023-12-25 ENCOUNTER — Other Ambulatory Visit (HOSPITAL_COMMUNITY): Payer: Self-pay

## 2023-12-25 ENCOUNTER — Encounter (HOSPITAL_COMMUNITY): Payer: Self-pay

## 2023-12-25 DIAGNOSIS — G43711 Chronic migraine without aura, intractable, with status migrainosus: Secondary | ICD-10-CM

## 2023-12-25 DIAGNOSIS — G43109 Migraine with aura, not intractable, without status migrainosus: Secondary | ICD-10-CM

## 2023-12-25 MED ORDER — KETOROLAC TROMETHAMINE 60 MG/2ML IM SOLN
INTRAMUSCULAR | 0 refills | Status: DC
  Filled 2023-12-25: qty 10, 30d supply, fill #0

## 2023-12-25 NOTE — Telephone Encounter (Signed)
 Last filled by patient on 11/29/23  Last office visit : 10/23/23 procedure visit   Next office visit : 01/20/24 procedure visit / 07/08/23 ov   - Patients new neurologist can be Dr Gregg (previous Ines patient)

## 2023-12-25 NOTE — Telephone Encounter (Addendum)
 Pt called wanting to know why this was denied. Pt has scheduled an appt with new provider to whom she has been assigned to and is needing this filled along with the syringes and she is needing it done before 10/31 due to ins change that is to happen where this will be more expensive for her. Please advise and call pt back with update.  Pt is needing it sent to the [facility] on [address], [place], [place] [address]

## 2023-12-25 NOTE — Telephone Encounter (Signed)
 Former [facility] routing to new assigned provider, Dr. Gregg to advise

## 2023-12-25 NOTE — Progress Notes (Deleted)
 Cardiology Office Note   Date:  12/25/2023  ID:  Mia Rubio, DOB Apr 01, 1980, MRN [id number] PCP: Frederik Charleston, MD  [facility] Providers Cardiologist:  Elspeth Sage, MD (Inactive)   History of Present Illness Mia Rubio is a 43 y.o. female with a past medical history of POTS, SVT, multiple neurologic and somatic symptoms, panic disorder, migraines. Per chart review, appears that patient has multiple neruological symptoms and is followed by a neurologist. She is followed by Dr. Sage and presents today for evaluation of elevated HR.    Per chart review, patient previously underwent SVT ablation in 07/2021. She had recurrent SVT that was treated with atenolol . She underwent echocardiogram 03/08/22 that showed EF 60-65%, no regional wall motion abnormalities, normal RV systolic function, no significant valvular abnormalities. She wore a cardiac monitor in 03/2022 that showed no significant arrhythmias. Triggered events correlated with sinus rhythm with HR 73-135 BPM.    Patient was last seen by Dr. Sage on 05/15/23. At that time, patient had a variety of complaints. These included zapping in her eyes, migraines. She was off beta-blockers. Dr. Sage recommended resuming, but patient did not want to make any medication changes.    Patient contacted the office 5/8 complaining of elevated HR. Reportedly had HR around 100 at rest. Had gotten up to 140 when going to the bathroom. An appointment was arranged for further evaluation.  I saw her in clinic 07/05/2023.  Attempted to manage symptoms with propranolol  20 mg twice daily.  With her history of SVT, we repeated a cardiac monitor that showed normal sinus rhythm with both sinus bradycardia to 44 bpm and sinus tachycardia up to 132 bpm.  There was 1 episode of SVT that only lasted 4 beats.  No VT, A-fib, or sustained SVT noted.  Patient's symptoms were associated with normal sinus rhythm  I saw patient in clinic in 08/2023.  At that time,  patient reported that her tachycardia seems to be a bit better controlled on propranolol .  She did notice having worsening fatigue.  Felt exhausted all the time and was sleeping for long periods of time.  She was very worried that her SVT might be coming back.  Propranolol  was decreased to 10 mg nightly to see if fatigue improved   POTS  SVT s/p Ablation  - Patient previously had SVT ablation in 07/2021. Has been followed by Dr. Sage since at least 2017 for POTS.  She also has several neurological conditions and follows with neurology. - Was previously on atenolol , but this was stopped due to fatigue  - Now on propranolol  10 mg nightly *** - Continue ivabradine  7.5 mg with BID, midodrine  7.5 mg TID  - Repeat cardiac monitor in 07/2023 showed only 1 run of SVT that lasted 4 beats, sinus rhythm with HR ranging from 44-132 BPM. Symptoms assicated with NSR  - Reassured patient that on her monitor, her SVT burden was very low and brief. She has some anxiety due to her symptoms. Discussed that brief palpitations are not dangerous. Discussed triggers for SVT.  - She does a good job staying hydrated, estimates she drinks about 6 propels per day and additional water  at meals. Encouraged use of compression stockings  - Encouraged her to continue recumbent exercises such as exercise bike, rowing, swimming  - Encouraged her to continue to follow with her neurologist for her multiple neurologic symptoms and diagnoses. Also encouraged her to establish care with a psychiatrist as she is on xanax , effexor   -  Referred her to POTs specialist at [facility]- she understands that the wait list is quite long   ROS: ***  Studies Reviewed      *** Risk Assessment/Calculations {Does this patient have ATRIAL FIBRILLATION?:[phone]} No BP recorded.  {Refresh Note OR Click here to enter BP  :1}***       Physical Exam VS:  LMP 08/26/2016        Wt Readings from Last 3 Encounters:  09/30/23 210 lb (95.3 kg)  09/20/23  210 lb (95.3 kg)  07/10/23 195 lb (88.5 kg)    GEN: Well nourished, well developed in no acute distress NECK: No JVD; No carotid bruits CARDIAC: ***RRR, no murmurs, rubs, gallops RESPIRATORY:  Clear to auscultation without rales, wheezing or rhonchi  ABDOMEN: Soft, non-tender, non-distended EXTREMITIES:  No edema; No deformity   ASSESSMENT AND PLAN ***    {Are you ordering a CV Procedure (e.g. stress test, cath, DCCV, TEE, etc)?   Press F2        :[id number]}  Dispo: ***  Signed, Rollo FABIENE Louder, PA-C

## 2023-12-26 ENCOUNTER — Other Ambulatory Visit (HOSPITAL_COMMUNITY): Payer: Self-pay

## 2023-12-26 NOTE — Telephone Encounter (Signed)
 Pt called and was able to get the medication but pharmacy informed her that the Syringe needles SYRINGE-NEEDLE, DISP, 3 ML (B-D 3CC LUER-LOK SYR 25GX1) 25G X 1 3 ML MISC are showing that they are still waiting on provider approval. Pt would like to know if this can be done soon due to tomorrow being the deadline for her because of insurance. Please advise.

## 2023-12-27 MED ORDER — BD LUER-LOK SYRINGE 25G X 1" 3 ML MISC: 0 refills | Status: AC

## 2024-01-06 NOTE — Telephone Encounter (Signed)
 Approved through 12/23/2024

## 2024-01-08 ENCOUNTER — Ambulatory Visit: Attending: Cardiology | Admitting: Cardiology

## 2024-01-10 ENCOUNTER — Encounter: Payer: Self-pay | Admitting: Cardiology

## 2024-01-14 MED ORDER — EMGALITY 120 MG/ML ~~LOC~~ SOAJ: SUBCUTANEOUS | 2 refills | Status: AC

## 2024-01-15 ENCOUNTER — Telehealth: Payer: Self-pay | Admitting: Adult Health

## 2024-01-15 NOTE — Telephone Encounter (Signed)
 [employer] called stating that the pt's BOTOX  has been approved from 01/15/24-01/14/25. A fax with this information will be faxed momentarily to the nurses as well.

## 2024-01-16 NOTE — Telephone Encounter (Signed)
 Auth#: [id number] (01/15/24-01/14/25)

## 2024-01-20 ENCOUNTER — Ambulatory Visit: Admitting: Adult Health

## 2024-01-20 DIAGNOSIS — G43709 Chronic migraine without aura, not intractable, without status migrainosus: Secondary | ICD-10-CM

## 2024-01-20 DIAGNOSIS — G43711 Chronic migraine without aura, intractable, with status migrainosus: Secondary | ICD-10-CM

## 2024-01-20 DIAGNOSIS — G43109 Migraine with aura, not intractable, without status migrainosus: Secondary | ICD-10-CM

## 2024-01-20 DIAGNOSIS — G43009 Migraine without aura, not intractable, without status migrainosus: Secondary | ICD-10-CM

## 2024-01-20 DIAGNOSIS — G43511 Persistent migraine aura without cerebral infarction, intractable, with status migrainosus: Secondary | ICD-10-CM

## 2024-01-20 MED ORDER — ONABOTULINUMTOXINA 200 UNITS IJ SOLR
155.0000 [IU] | Freq: Once | INTRAMUSCULAR | Status: AC
  Administered 2024-01-20: 165 [IU] via INTRAMUSCULAR

## 2024-01-20 MED ORDER — VENLAFAXINE HCL ER 75 MG PO CP24: 75.0000 mg | ORAL_CAPSULE | Freq: Every day | ORAL | 11 refills | Status: AC

## 2024-01-20 MED ORDER — GABAPENTIN 300 MG PO CAPS: 300.0000 mg | ORAL_CAPSULE | Freq: Every day | ORAL | 11 refills | Status: AC

## 2024-01-20 MED ORDER — GABAPENTIN 100 MG PO TABS: 100.0000 mg | ORAL_TABLET | Freq: Every day | ORAL | 11 refills | Status: AC

## 2024-01-20 MED ORDER — KETOROLAC TROMETHAMINE 60 MG/2ML IM SOLN: INTRAMUSCULAR | 0 refills | Status: DC

## 2024-01-20 MED ORDER — MECLIZINE HCL 25 MG PO TABS: 25.0000 mg | ORAL_TABLET | Freq: Three times a day (TID) | ORAL | 11 refills | Status: AC | PRN

## 2024-01-20 MED ORDER — VENLAFAXINE HCL ER 150 MG PO CP24: 150.0000 mg | ORAL_CAPSULE | Freq: Every day | ORAL | 11 refills | Status: AC

## 2024-01-20 MED ORDER — RIZATRIPTAN BENZOATE 10 MG PO TBDP: 10.0000 mg | ORAL_TABLET | ORAL | 11 refills | Status: AC | PRN

## 2024-01-20 NOTE — Progress Notes (Signed)
 Botox - 200 units x 1 vial Lot: [id number]       Expiration: 07/2025 NDC: 9976-6078-97   Bacteriostatic 0.9% Sodium Chloride - 4 mL  Lot: [id number] Expiration: 12/26/2024 NDC: [id number]   Dx: H56.290 S/P  Witnessed by Rojean DEL

## 2024-01-20 NOTE — Progress Notes (Signed)
 Update 01/20/2024 JM: Patient returns for Botox  injection.  Prior injection 10/23/2023. Reports continued benefit with botox , notes >50% migraine reduction. She also remains on Emgality  monthly injection, venlafaxine  225 mg daily, and gabapentin  100/300. Will use Nurtec or rizatriptan  as needed, notes greater benefit with Nurtec. Occasional use of Toradol  injection if migraine persists with great benefit, will use meclizine  as needed for dizziness associated with migraine. Cost of medications will be increased in 2026 and unsure if she will be able to afford Nurtec or Emgality . Masseter injections continue to help with jaw pain and clenching which can trigger migraine headache. Tolerated procedure well today.  Return in 3 months for repeat injection.         Consent Form Botulism Toxin Injection For Chronic Migraine    Reviewed orally with patient, additionally signature is on file:  Botulism toxin has been approved by the Federal drug administration for treatment of chronic migraine. Botulism toxin does not cure chronic migraine and it may not be effective in some patients.  The administration of botulism toxin is accomplished by injecting a small amount of toxin into the muscles of the neck and head. Dosage must be titrated for each individual. Any benefits resulting from botulism toxin tend to wear off after 3 months with a repeat injection required if benefit is to be maintained. Injections are usually done every 3-4 months with maximum effect peak achieved by about 2 or 3 weeks. Botulism toxin is expensive and you should be sure of what costs you will incur resulting from the injection.  The side effects of botulism toxin use for chronic migraine may include:   -Transient, and usually mild, facial weakness with facial injections  -Transient, and usually mild, head or neck weakness with head/neck injections  -Reduction or loss of forehead facial animation due to forehead muscle  weakness  -Eyelid drooping  -Dry eye  -Pain at the site of injection or bruising at the site of injection  -Double vision  -Potential unknown long term risks   Contraindications: You should not have Botox  if you are pregnant, nursing, allergic to albumin, have an infection, skin condition, or muscle weakness at the site of the injection, or have myasthenia gravis, Lambert-Eaton syndrome, or ALS.  It is also possible that as with any injection, there may be an allergic reaction or no effect from the medication. Reduced effectiveness after repeated injections is sometimes seen and rarely infection at the injection site may occur. All care will be taken to prevent these side effects. If therapy is given over a long time, atrophy and wasting in the muscle injected may occur. Occasionally the patient's become refractory to treatment because they develop antibodies to the toxin. In this event, therapy needs to be modified.  I have read the above information and consent to the administration of botulism toxin.    BOTOX  PROCEDURE NOTE FOR MIGRAINE HEADACHE  Contraindications and precautions discussed with patient(above). Aseptic procedure was observed and patient tolerated procedure. Procedure performed by Harlene Bogaert, AGNP-BC.   The condition has existed for more than 6 months, and pt does not have a diagnosis of ALS, Myasthenia Gravis or Lambert-Eaton Syndrome.  Risks and benefits of injections discussed and pt agrees to proceed with the procedure.  Written consent obtained  These injections are medically necessary. Pt  receives good benefits from these injections. These injections do not cause sedations or hallucinations which the oral therapies may cause.   Description of procedure:  The patient was  placed in a sitting position. The standard protocol was used for Botox  as follows, with 5 units of Botox  injected at each site:  -Procerus muscle, midline injection  -Corrugator muscle,  bilateral injection  -Frontalis muscle, bilateral injection, with 2 sites each side, medial injection was performed in the upper one third of the frontalis muscle, in the region vertical from the medial inferior edge of the superior orbital rim. The lateral injection was again in the upper one third of the forehead vertically above the lateral limbus of the cornea, 1.5 cm lateral to the medial injection site.  -Temporalis muscle injection, 4 sites, bilaterally. The first injection was 3 cm above the tragus of the ear, second injection site was 1.5 cm to 3 cm up from the first injection site in line with the tragus of the ear. The third injection site was 1.5-3 cm forward between the first 2 injection sites. The fourth injection site was 1.5 cm posterior to the second injection site. 5th site laterally in the temporalis  muscleat the level of the outer canthus.  -Occipitalis muscle injection, 3 sites, bilaterally. The first injection was done one half way between the occipital protuberance and the tip of the mastoid process behind the ear. The second injection site was done lateral and superior to the first, 1 fingerbreadth from the first injection. The third injection site was 1 fingerbreadth superiorly and medially from the first injection site.  -Cervical paraspinal muscle injection, 2 sites, bilaterally. The first injection site was 1 cm from the midline of the cervical spine, 3 cm inferior to the lower border of the occipital protuberance. The second injection site was 1.5 cm superiorly and laterally to the first injection site.  -Trapezius muscle injection was performed at 3 sites, bilaterally. The first injection site was in the upper trapezius muscle halfway between the inflection point of the neck, and the acromion. The second injection site was one half way between the acromion and the first injection site. The third injection was done between the first injection site and the inflection point of  the neck.  -Masseter muscle injection, 1 side bilaterally    A total of 200 units of Botox  was prepared, 165 units of Botox  was injected as documented above, any Botox  not injected was wasted. The patient tolerated the procedure well, there were no complications of the above procedure.   Harlene Bogaert, AGNP-BC  [facility] [address] [place], [place] [address]  Phone [phone] Fax [phone] Note: This document was prepared with digital dictation and possible smart phrase technology. Any transcriptional errors that result from this process are unintentional.

## 2024-01-21 DIAGNOSIS — K080 Exfoliation of teeth due to systemic causes: Secondary | ICD-10-CM | POA: Diagnosis not present

## 2024-02-03 ENCOUNTER — Ambulatory Visit: Admitting: Dermatology

## 2024-02-03 ENCOUNTER — Encounter: Payer: Self-pay | Admitting: Dermatology

## 2024-02-03 VITALS — BP 122/81

## 2024-02-03 DIAGNOSIS — L299 Pruritus, unspecified: Secondary | ICD-10-CM | POA: Diagnosis not present

## 2024-02-03 DIAGNOSIS — L2089 Other atopic dermatitis: Secondary | ICD-10-CM | POA: Diagnosis not present

## 2024-02-03 MED ORDER — CLOBETASOL PROPIONATE 0.05 % EX CREA: 1.0000 | TOPICAL_CREAM | Freq: Two times a day (BID) | CUTANEOUS | 2 refills | Status: AC

## 2024-02-03 MED ORDER — NEMLUVIO 30 MG ~~LOC~~ AUIJ: 30.0000 mg | AUTO-INJECTOR | SUBCUTANEOUS | 11 refills | Status: AC

## 2024-02-03 MED ORDER — NEMLUVIO 30 MG ~~LOC~~ AUIJ: 60.0000 mg | AUTO-INJECTOR | SUBCUTANEOUS | 0 refills | Status: AC

## 2024-02-03 NOTE — Patient Instructions (Addendum)
 VISIT SUMMARY:  Today, we discussed your chronic itchy skin lesions that have been bothering you for the past two to three years. These lesions have been particularly severe on your arms, legs, and upper back, and have caused significant discomfort, especially at night. We reviewed your previous treatments and discussed a new plan to help manage your symptoms.  YOUR PLAN:  -PAPULAR ECZEMA (Atopic Dermatitis):  Papular eczema is a skin condition characterized by itchy, inflamed bumps. It can be triggered by various factors, including environmental elements and possibly medications.   For your treatment, we recommend applying clobetasol  topically twice daily for two weeks, then taking a two-week break, and repeating this cycle as needed. During the breaks from clobetasol , you can use CeraVe anti-itch lotion or Sarna lotion to help manage the itching.   Additionally, taking warm water  showers can help prevent skin dryness.   We have also submitted an application for Nemluvio , a medication that targets itch receptors, to your insurance for approval. This process may take about three weeks. We will follow up in four months to assess the effectiveness of the treatment and to start Namluvio if it gets approved.  INSTRUCTIONS:  Please follow the treatment plan as discussed. Apply clobetasol  twice daily for two weeks, then take a two-week break, and repeat as needed. Use CeraVe anti-itch lotion or Sarna lotion during the breaks. Take warm water  showers to avoid skin dryness. We have submitted an application for Namluvio to your insurance, which may take about three weeks for approval. We will have a follow-up appointment in four months to evaluate your progress and start Namluvio if it is approved.       Important Information  Due to recent changes in healthcare laws, you may see results of your pathology and/or laboratory studies on MyChart before the doctors have had a chance to review them.  We understand that in some cases there may be results that are confusing or concerning to you. Please understand that not all results are received at the same time and often the doctors may need to interpret multiple results in order to provide you with the best plan of care or course of treatment. Therefore, we ask that you please give us  2 business days to thoroughly review all your results before contacting the office for clarification. Should we see a critical lab result, you will be contacted sooner.   If You Need Anything After Your Visit  If you have any questions or concerns for your doctor, please call our main line at [phone] If no one answers, please leave a voicemail as directed and we will return your call as soon as possible. Messages left after 4 pm will be answered the following business day.   You may also send us  a message via MyChart. We typically respond to MyChart messages within 1-2 business days.  For prescription refills, please ask your pharmacy to contact our office. Our fax number is [phone].  If you have an urgent issue when the clinic is closed that cannot wait until the next business day, you can page your doctor at the number below.    Please note that while we do our best to be available for urgent issues outside of office hours, we are not available 24/7.   If you have an urgent issue and are unable to reach us , you may choose to seek medical care at your doctor's office, retail clinic, urgent care center, or emergency room.  If you have a  medical emergency, please immediately call 911 or go to the emergency department. In the event of inclement weather, please call our main line at [phone] for an update on the status of any delays or closures.  Dermatology Medication Tips: Please keep the boxes that topical medications come in in order to help keep track of the instructions about where and how to use these. Pharmacies typically print the  medication instructions only on the boxes and not directly on the medication tubes.   If your medication is too expensive, please contact our office at [phone] or send us  a message through MyChart.   We are unable to tell what your co-pay for medications will be in advance as this is different depending on your insurance coverage. However, we may be able to find a substitute medication at lower cost or fill out paperwork to get insurance to cover a needed medication.   If a prior authorization is required to get your medication covered by your insurance company, please allow us  1-2 business days to complete this process.  Drug prices often vary depending on where the prescription is filled and some pharmacies may offer cheaper prices.  The website www.goodrx.com contains coupons for medications through different pharmacies. The prices here do not account for what the cost may be with help from insurance (it may be cheaper with your insurance), but the website can give you the price if you did not use any insurance.  - You can print the associated coupon and take it with your prescription to the pharmacy.  - You may also stop by our office during regular business hours and pick up a GoodRx coupon card.  - If you need your prescription sent electronically to a different pharmacy, notify our office through [facility] or by phone at [phone]

## 2024-02-03 NOTE — Progress Notes (Unsigned)
   New Patient Visit   Subjective  Mia Rubio is a 43 y.o. female who presents for the following: Scabs of arms, legs and upper back that have an intense itch. Her arms are always worse than her legs. It seems to flare more in the summer and she has weeks of itching before it calms down. They started popping up a couple of years ago and she had a heart procedure. She was given TMC 0.1% ointment by her allergist but it has not helped. The itch will stop for a couple of hours but then comes back.  She is washing Cerave Gentle cleanser. She is very light on moisturizer because feels like it gets worse. She washes with fragrance free detergent.    The following portions of the chart were reviewed this encounter and updated as appropriate: medications, allergies, medical history  Review of Systems:  No other skin or systemic complaints except as noted in HPI or Assessment and Plan.  Objective  Well appearing patient in no apparent distress; mood and affect are within normal limits.   A focused examination was performed of the following areas: Arms    Relevant exam findings are noted in the Assessment and Plan.    Assessment & Plan   ATOPIC DERMATITIS Exam: Scaly pink papules coalescing to plaques ***% BSA   Atopic dermatitis (eczema) is a chronic, relapsing, pruritic condition that can significantly affect quality of life. It is often associated with allergic rhinitis and/or asthma and can require treatment with topical medications, phototherapy, or in severe cases biologic injectable medication (Dupixent; Adbry) or Oral JAK inhibitors.  Treatment Plan: Clobetasol  cream Apply twice daily x 2 weeks - take a 2 week break before restarting as needed.  We will submit paperwork for approval for Nemluvio .  Recommend gentle skin care.      Return in about 4 months (around 06/03/2024) for Nemluvio  follow up.  I, Roseline Hutchinson, CMA, am acting as scribe for [employer], DO  .   Documentation: I have reviewed the above documentation for accuracy and completeness, and I agree with the above.  Delon Lenis, DO

## 2024-02-10 ENCOUNTER — Other Ambulatory Visit: Payer: Self-pay

## 2024-02-10 ENCOUNTER — Other Ambulatory Visit (HOSPITAL_COMMUNITY): Payer: Self-pay

## 2024-02-10 ENCOUNTER — Telehealth: Payer: Self-pay | Admitting: Pharmacist

## 2024-02-10 NOTE — Telephone Encounter (Signed)
 Pharmacy Patient Advocate Encounter  Received notification from [facility] that Prior Authorization for Emgality  120MG /ML auto-injectors (migraine) has been APPROVED from 02/10/2024 to 02/09/2025   PA #/Case ID/Reference #: [id number]

## 2024-02-10 NOTE — Telephone Encounter (Signed)
 Pharmacy Patient Advocate Encounter   Received notification from Patient Pharmacy that prior authorization for Emgality  120MG /ML auto-injectors (migraine) is required/requested.   Insurance verification completed.   The patient is insured through [facility].   Per test claim: PA required; PA submitted to above mentioned insurance via CoverMyMeds Key/confirmation #/EOC [id number] Status is pending

## 2024-02-12 MED ORDER — MIDODRINE HCL 5 MG PO TABS: 7.5000 mg | ORAL_TABLET | Freq: Three times a day (TID) | ORAL | 1 refills | Status: AC

## 2024-02-17 ENCOUNTER — Other Ambulatory Visit: Payer: Self-pay

## 2024-02-17 DIAGNOSIS — G43109 Migraine with aura, not intractable, without status migrainosus: Secondary | ICD-10-CM

## 2024-02-17 DIAGNOSIS — G43711 Chronic migraine without aura, intractable, with status migrainosus: Secondary | ICD-10-CM

## 2024-02-17 MED ORDER — KETOROLAC TROMETHAMINE 60 MG/2ML IM SOLN: INTRAMUSCULAR | 0 refills | Status: AC

## 2024-04-20 ENCOUNTER — Ambulatory Visit: Admitting: Adult Health

## 2024-06-15 ENCOUNTER — Ambulatory Visit: Admitting: Dermatology

## 2024-07-07 ENCOUNTER — Ambulatory Visit: Admitting: Neurology
